# Patient Record
Sex: Female | Born: 1980
Health system: Southern US, Community
[De-identification: ages and names within clinical notes are randomized; demographics above are authoritative.]

## PROBLEM LIST (undated history)

## (undated) DIAGNOSIS — D219 Benign neoplasm of connective and other soft tissue, unspecified: Secondary | ICD-10-CM

## (undated) DIAGNOSIS — E785 Hyperlipidemia, unspecified: Secondary | ICD-10-CM

## (undated) DIAGNOSIS — G4730 Sleep apnea, unspecified: Secondary | ICD-10-CM

## (undated) DIAGNOSIS — I82409 Acute embolism and thrombosis of unspecified deep veins of unspecified lower extremity: Secondary | ICD-10-CM

## (undated) DIAGNOSIS — R87619 Unspecified abnormal cytological findings in specimens from cervix uteri: Secondary | ICD-10-CM

## (undated) DIAGNOSIS — J189 Pneumonia, unspecified organism: Secondary | ICD-10-CM

## (undated) DIAGNOSIS — D649 Anemia, unspecified: Secondary | ICD-10-CM

## (undated) DIAGNOSIS — F32A Depression, unspecified: Secondary | ICD-10-CM

## (undated) DIAGNOSIS — R5382 Chronic fatigue, unspecified: Secondary | ICD-10-CM

## (undated) DIAGNOSIS — F329 Major depressive disorder, single episode, unspecified: Secondary | ICD-10-CM

## (undated) DIAGNOSIS — R5383 Other fatigue: Secondary | ICD-10-CM

## (undated) DIAGNOSIS — R32 Unspecified urinary incontinence: Secondary | ICD-10-CM

## (undated) DIAGNOSIS — M549 Dorsalgia, unspecified: Secondary | ICD-10-CM

## (undated) DIAGNOSIS — Z72820 Sleep deprivation: Secondary | ICD-10-CM

## (undated) DIAGNOSIS — R0602 Shortness of breath: Secondary | ICD-10-CM

## (undated) DIAGNOSIS — R7303 Prediabetes: Secondary | ICD-10-CM

## (undated) DIAGNOSIS — N200 Calculus of kidney: Secondary | ICD-10-CM

## (undated) DIAGNOSIS — F909 Attention-deficit hyperactivity disorder, unspecified type: Secondary | ICD-10-CM

## (undated) DIAGNOSIS — K589 Irritable bowel syndrome without diarrhea: Secondary | ICD-10-CM

## (undated) DIAGNOSIS — G43909 Migraine, unspecified, not intractable, without status migrainosus: Secondary | ICD-10-CM

## (undated) DIAGNOSIS — E559 Vitamin D deficiency, unspecified: Secondary | ICD-10-CM

## (undated) DIAGNOSIS — I82A12 Acute embolism and thrombosis of left axillary vein: Secondary | ICD-10-CM

## (undated) DIAGNOSIS — F419 Anxiety disorder, unspecified: Secondary | ICD-10-CM

## (undated) DIAGNOSIS — K219 Gastro-esophageal reflux disease without esophagitis: Secondary | ICD-10-CM

## (undated) DIAGNOSIS — E079 Disorder of thyroid, unspecified: Secondary | ICD-10-CM

## (undated) HISTORY — DX: Acute embolism and thrombosis of left axillary vein: I82.A12

## (undated) HISTORY — DX: Depression, unspecified: F32.A

## (undated) HISTORY — DX: Unspecified abnormal cytological findings in specimens from cervix uteri: R87.619

## (undated) HISTORY — DX: Dorsalgia, unspecified: M54.9

## (undated) HISTORY — PX: CRYOTHERAPY: SHX1416

## (undated) HISTORY — DX: Sleep deprivation: Z72.820

## (undated) HISTORY — DX: Calculus of kidney: N20.0

## (undated) HISTORY — DX: Other fatigue: R53.83

## (undated) HISTORY — DX: Prediabetes: R73.03

## (undated) HISTORY — DX: Disorder of thyroid, unspecified: E07.9

## (undated) HISTORY — DX: Acute embolism and thrombosis of unspecified deep veins of unspecified lower extremity: I82.409

## (undated) HISTORY — DX: Major depressive disorder, single episode, unspecified: F32.9

## (undated) HISTORY — DX: Shortness of breath: R06.02

## (undated) HISTORY — DX: Benign neoplasm of connective and other soft tissue, unspecified: D21.9

## (undated) HISTORY — DX: Pneumonia, unspecified organism: J18.9

## (undated) HISTORY — DX: Attention-deficit hyperactivity disorder, unspecified type: F90.9

## (undated) HISTORY — DX: Unspecified urinary incontinence: R32

## (undated) HISTORY — DX: Gastro-esophageal reflux disease without esophagitis: K21.9

## (undated) HISTORY — DX: Anxiety disorder, unspecified: F41.9

## (undated) HISTORY — PX: INTRAUTERINE DEVICE INSERTION: SHX323

## (undated) HISTORY — DX: Sleep apnea, unspecified: G47.30

## (undated) HISTORY — DX: Irritable bowel syndrome, unspecified: K58.9

## (undated) HISTORY — DX: Hyperlipidemia, unspecified: E78.5

## (undated) HISTORY — DX: Chronic fatigue, unspecified: R53.82

## (undated) HISTORY — DX: Vitamin D deficiency, unspecified: E55.9

## (undated) HISTORY — DX: Migraine, unspecified, not intractable, without status migrainosus: G43.909

## (undated) HISTORY — DX: Anemia, unspecified: D64.9

---

## 2003-04-25 ENCOUNTER — Encounter: Payer: Self-pay | Admitting: *Deleted

## 2003-04-25 ENCOUNTER — Emergency Department (HOSPITAL_COMMUNITY): Admission: EM | Admit: 2003-04-25 | Discharge: 2003-04-25 | Payer: Self-pay

## 2005-07-23 ENCOUNTER — Emergency Department (HOSPITAL_COMMUNITY): Admission: EM | Admit: 2005-07-23 | Discharge: 2005-07-24 | Payer: Self-pay | Admitting: Family Medicine

## 2007-03-03 ENCOUNTER — Other Ambulatory Visit: Admission: RE | Admit: 2007-03-03 | Discharge: 2007-03-03 | Payer: Self-pay | Admitting: Obstetrics & Gynecology

## 2008-05-06 ENCOUNTER — Other Ambulatory Visit: Admission: RE | Admit: 2008-05-06 | Discharge: 2008-05-06 | Payer: Self-pay | Admitting: Obstetrics & Gynecology

## 2008-09-17 ENCOUNTER — Emergency Department (HOSPITAL_COMMUNITY): Admission: EM | Admit: 2008-09-17 | Discharge: 2008-09-18 | Payer: Self-pay | Admitting: Internal Medicine

## 2009-06-28 ENCOUNTER — Ambulatory Visit: Payer: Self-pay | Admitting: Family Medicine

## 2010-06-15 ENCOUNTER — Ambulatory Visit: Payer: Self-pay | Admitting: Family Medicine

## 2010-06-20 ENCOUNTER — Ambulatory Visit: Payer: Self-pay | Admitting: Family Medicine

## 2010-06-28 ENCOUNTER — Ambulatory Visit: Payer: Self-pay | Admitting: Gastroenterology

## 2010-07-07 ENCOUNTER — Inpatient Hospital Stay: Payer: Self-pay | Admitting: Surgery

## 2011-02-03 ENCOUNTER — Ambulatory Visit: Payer: Self-pay | Admitting: Family Medicine

## 2011-05-11 NOTE — Op Note (Signed)
NAME:  Savannah George, Savannah George                        ACCOUNT NO.:  [id number]   MEDICAL RECORD NO.:  [id number]                   PATIENT TYPE:  EMS   LOCATION:  ED                                   FACILITY:  [facility]   PHYSICIAN:  Dionne Ano. Everlene Other, M.D.         DATE OF BIRTH:  [birth year]   DATE OF PROCEDURE:  04/25/2003  DATE OF DISCHARGE:                                 OPERATIVE REPORT   HISTORY OF PRESENT ILLNESS:  I had the pleasure to see this patient in the  [facility] emergency room following the kind referral from Pearletha Alfred,  M.D., in regard to her left upper extremity predicament.  This patient is a  30 year old right-hand dominant female who had a heavy door slam against her  left index finger today, sustaining the above-mentioned injury.  The patient  has an open fracture with a partial nail avulsion, bleeding, and significant  pain.  She notes no opposite extremity pain or lower extremity pain.  She is  accompanied in the emergency room by her sister.  Tetanus status has been  addressed.   PAST MEDICAL HISTORY:  None.   PAST SURGICAL HISTORY:  None.   ALLERGIES:  ASPIRIN.   MEDICATIONS:  None.   SOCIAL HISTORY:  She does not smoke or drink.  She is a Consulting civil engineer at [employer].   PHYSICAL EXAMINATION:  GENERAL:  A white female, alert and oriented, in no  acute distress.  NECK, BACK, CHEST:  Benign.  ABDOMEN:  Nontender, nondistended.  EXTREMITIES:  The patient has normal right upper extremity exam.  The lower  extremity examination shows no obvious deformity or abnormality.  She is  neurovascularly intact.   The left upper extremity is significant for a nail bed laceration and nail  plate partial avulsion with open fracture about the left index finger.  The  PIP and MCP joints are intact.  The FDP, FPS, and extensor function is  normal.  Collateral ligaments are stable.  There is no sign of infection or  compartment syndrome.   LABORATORY DATA:  X-rays were  reviewed, which showed a distal phalanx  fracture, displaced in nature, about the left index finger.   IMPRESSION:  Open distal phalanx fracture, left index finger, with nail  plate partial avulsion, nail bed injury, and poor soft tissue conditions.   PLAN:  I verbally consented her for I&D and repair of structures.   PROCEDURE NOTE:  The patient was given a lidocaine block.  She was then  prepped and draped in the usual sterile fashion with Betadine scrub and  paint by myself.  Once this was done, a tourniquet was applied to the finger  and she underwent an I&D of skin, subcutaneous tissue, bone, and nail bed  tissue.  This was done without difficulty.  This was an excision  debridement, including particulate matter.  It  was copiously I&D'd.  The  nail plate then  underwent removal.  The nail plate was removed and I then  thoroughly irrigated the wound once again.  I ensured excellent cleanliness  of the opposing structures and then performed a reduction of the fracture.  This reduced nicely, and the bone was fit nicely.  Following this I  performed a nail bed repair with 6-0 chromic suture under 4.0 loupe  magnification.  I then repaired the lateral fold where there were  lacerations with 4-0 chromic.  She tolerated this well without difficulty.   Thus, she underwent I&D for repair of nail bed and removal of nail plate and  open reduction of her distal phalanx fracture.  She tolerated this well.  The tourniquet was deflated and excellent refill was noted.  Following this  a sterile dressing was applied and a __________ splint was placed.  She will  return to the office to see me in seven days in therapy and 14 days in my  clinic.  I have given her a prescription for Keflex as well as Vicodin and  Robaxin.  She will notify me should any problems, questions, or concerns  arise.  Otherwise I will look forward to seeing her as noted above.   All questions were encouraged and answered.   She was instructed on elevation  and other measures.                                                Dionne Ano. Everlene Other, M.D.    Nash Mantis  D:  04/25/2003  T:  04/26/2003  Job:  [id number]   cc:   Pearletha Alfred, M.D.  [address]  [place] [address]  Fax: [phone]

## 2011-07-02 IMAGING — US ABDOMEN ULTRASOUND
1 series · 17 of 25 positions shown · non-contrast
Comparison: none

REASON FOR EXAM: RUQ abd pain
COMMENTS:

[Series 1: abdomen ultrasound · 17 of 52 slices shown]
[im 1/52]
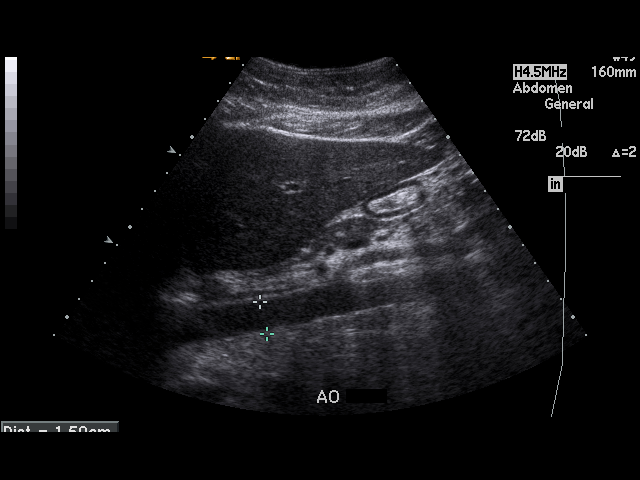
[im 5/52]
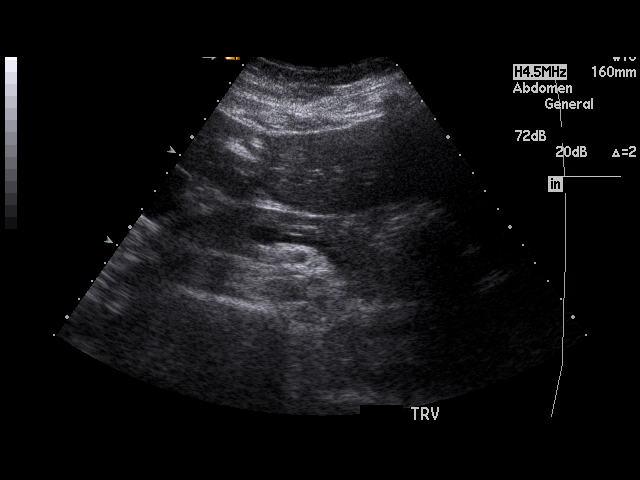
[im 7/52]
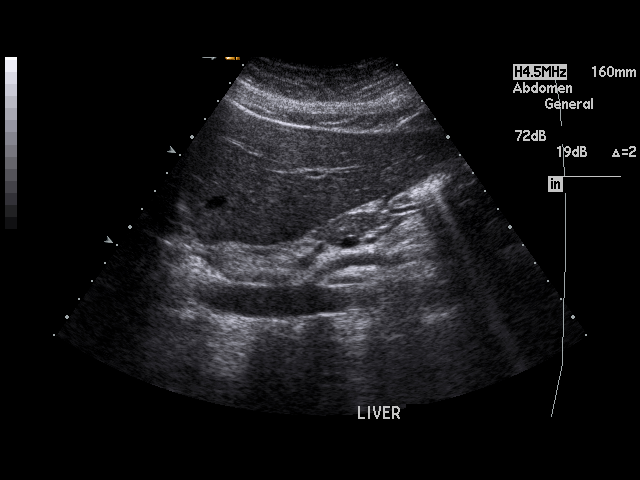
[im 11/52]
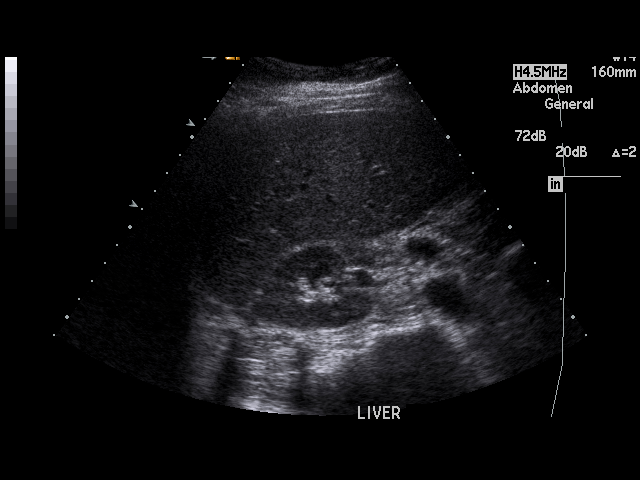
[im 13/52]
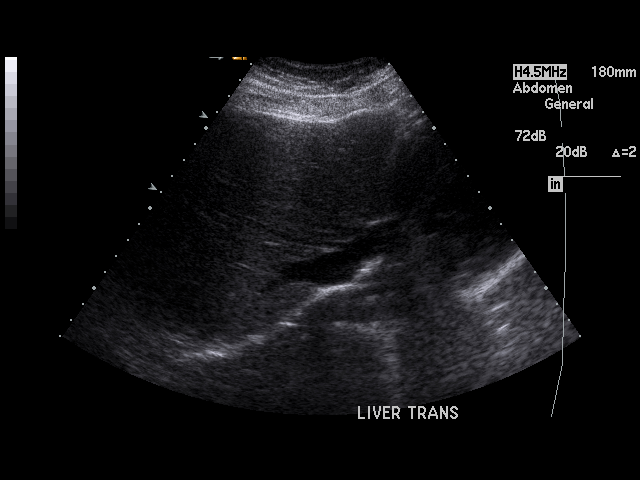
[im 18/52]
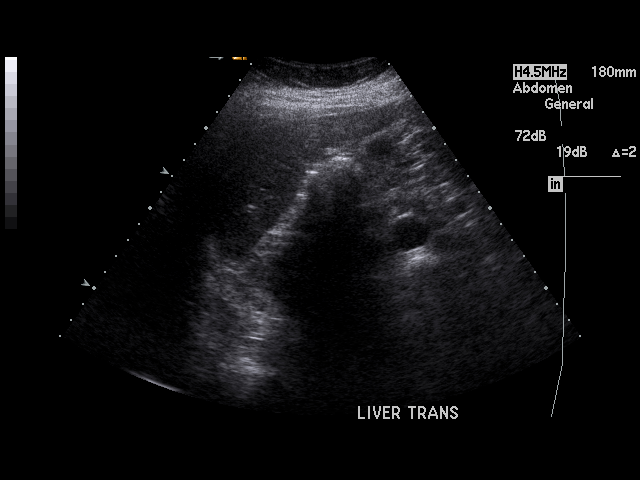
[im 20/52]
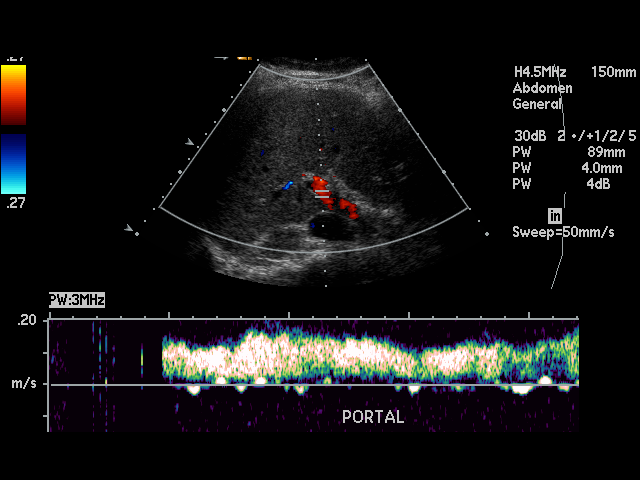
[im 24/52]
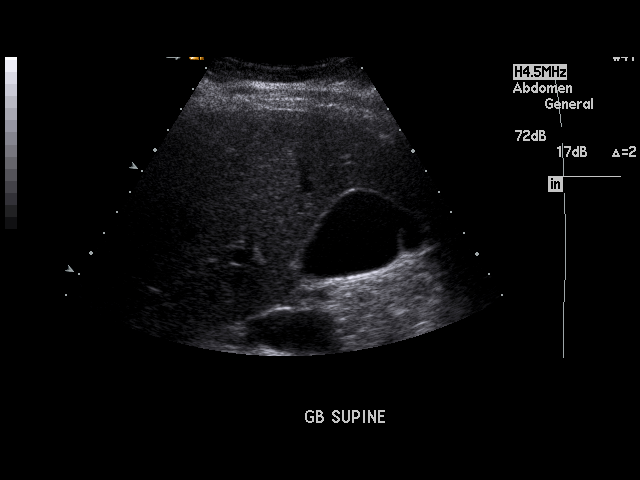
[im 26/52]
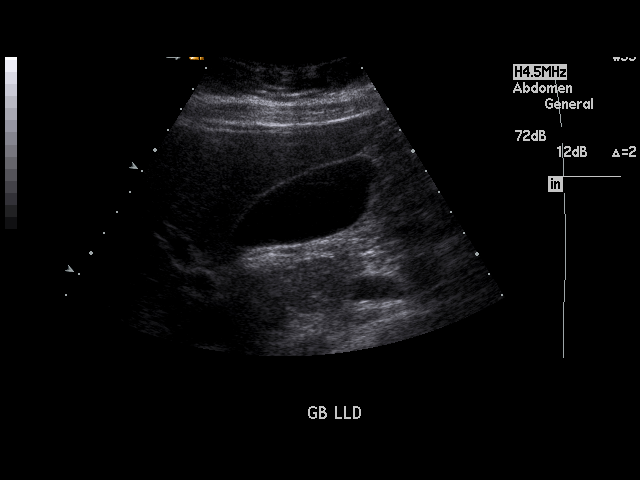
[im 28/52]
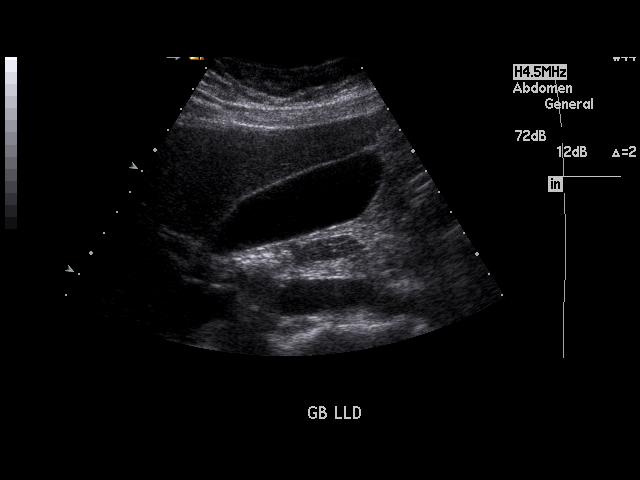
[im 32/52]
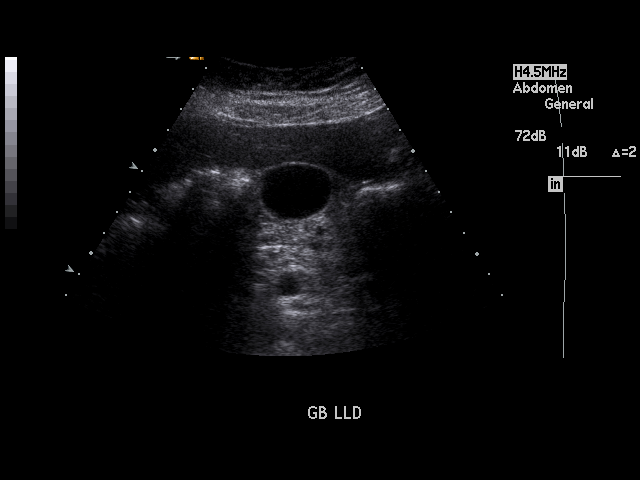
[im 35/52]
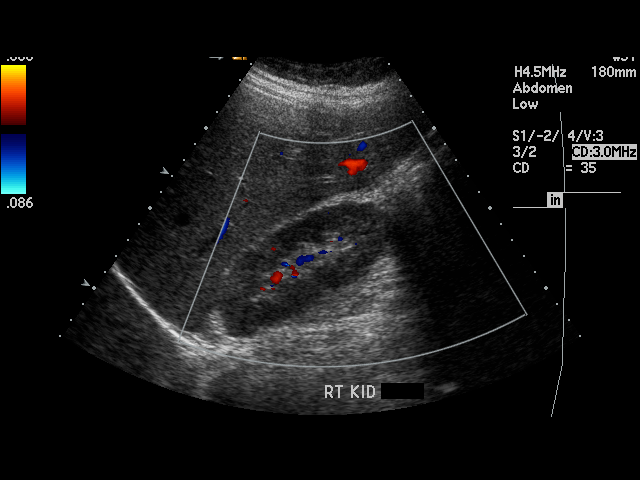
[im 39/52]
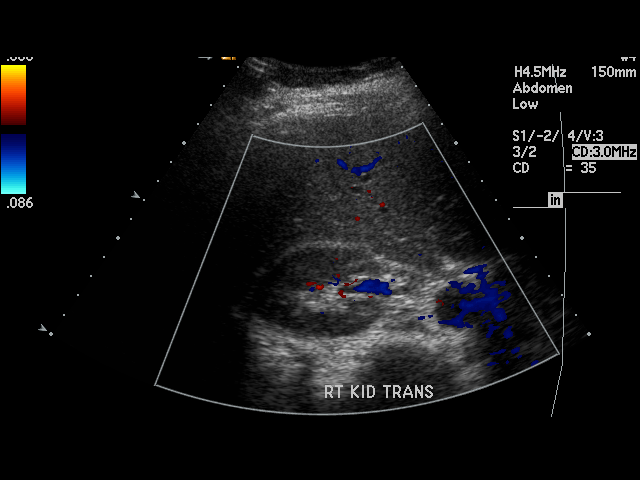
[im 41/52]
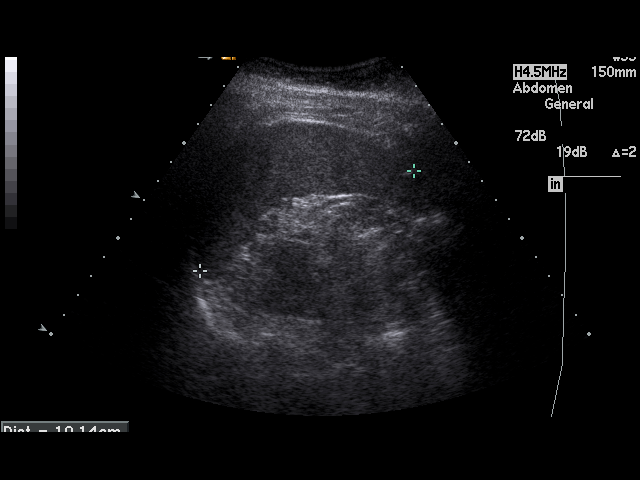
[im 45/52]
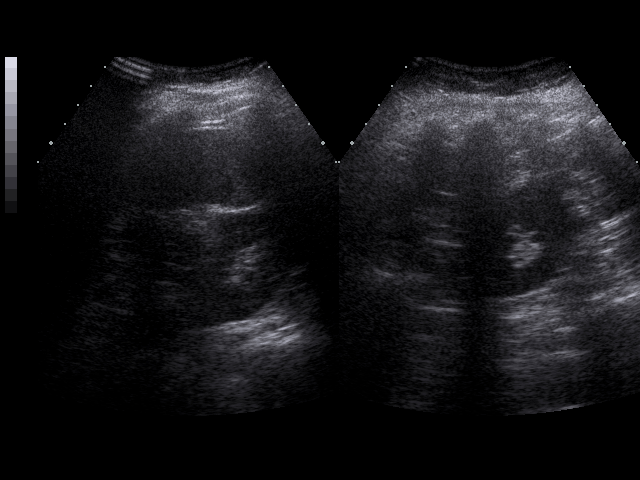
[im 47/52]
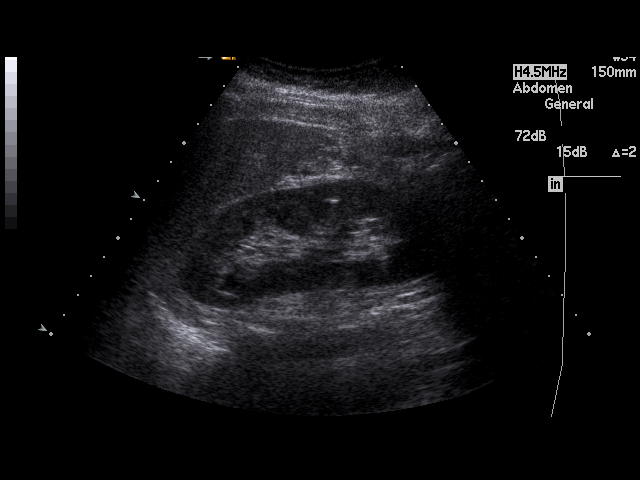
[im 52/52]
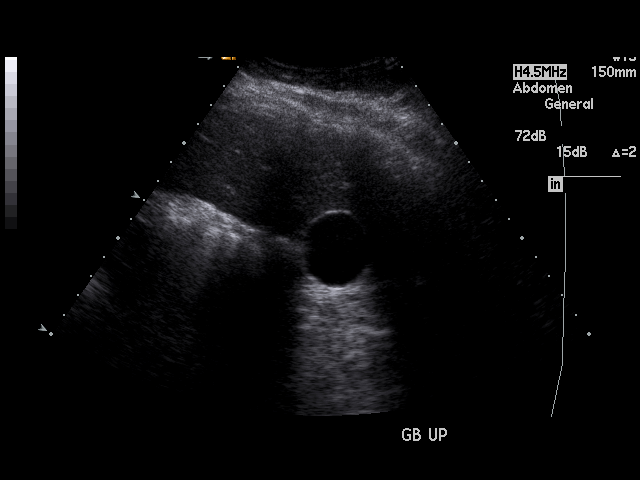

[17 of 25 positions shown; findings below may reference images not displayed]

PROCEDURE:     US  - US ABDOMEN GENERAL SURVEY  - June 15, 2010  [DATE]

RESULT:     The liver, spleen, abdominal aorta and inferior vena cava show
no significant abnormalities. The visualized portion of the pancreas is
normal in appearance. No gallstones are seen. There is no thickening of the
gallbladder wall. The common bile duct measures 3 mm in diameter which is
within normal limits. The kidneys show no hydronephrosis. There is no
ascites.
IMPRESSION: No significant abnormalities are noted.

## 2011-07-25 IMAGING — CR DG THORACIC SPINE 2-3V
1 series · 2 of 2 positions shown · non-contrast
Comparison: none

REASON FOR EXAM: Back pain
COMMENTS:

PROCEDURE:     DXR - DXR THORACIC  AP AND LATERAL  - July 08, 2010 [DATE]
RESULT:     Comparison: None

[Series 1: view not recorded · 0.17mm/px · 2 of 2 slices shown]
[im 1/2]
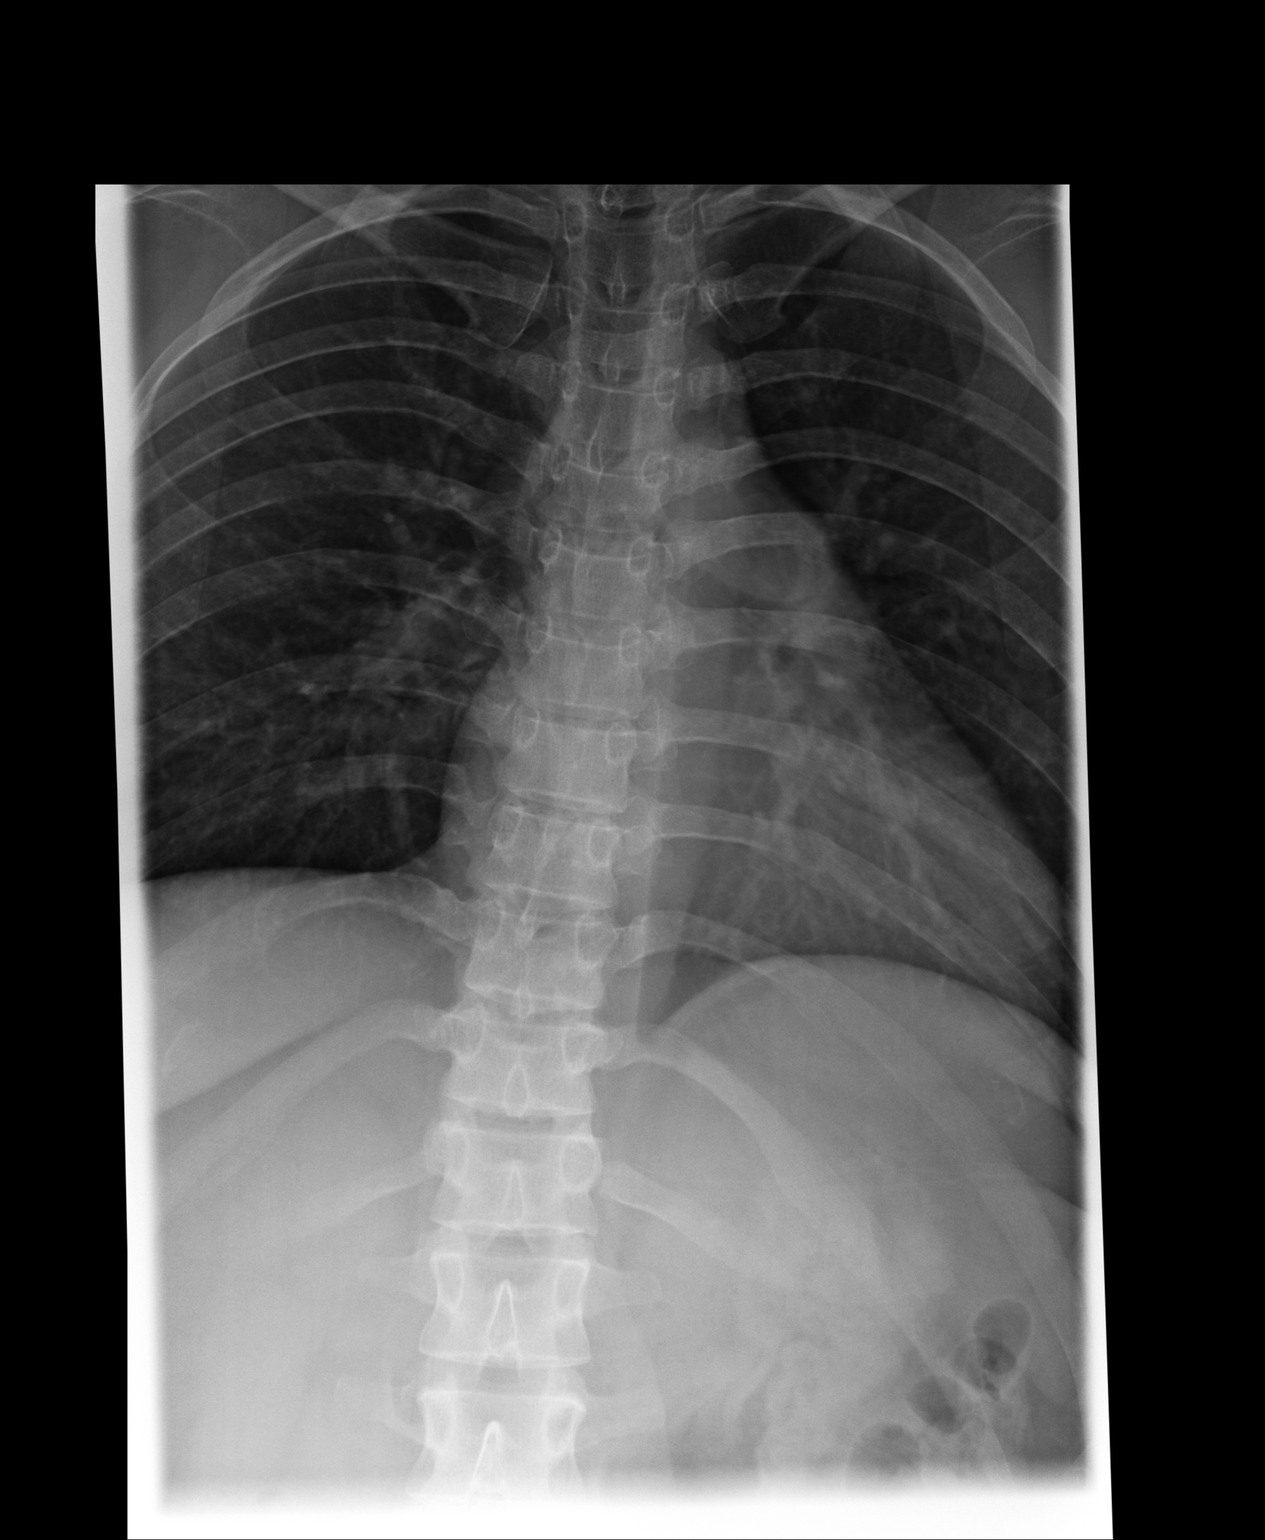
[im 2/2]
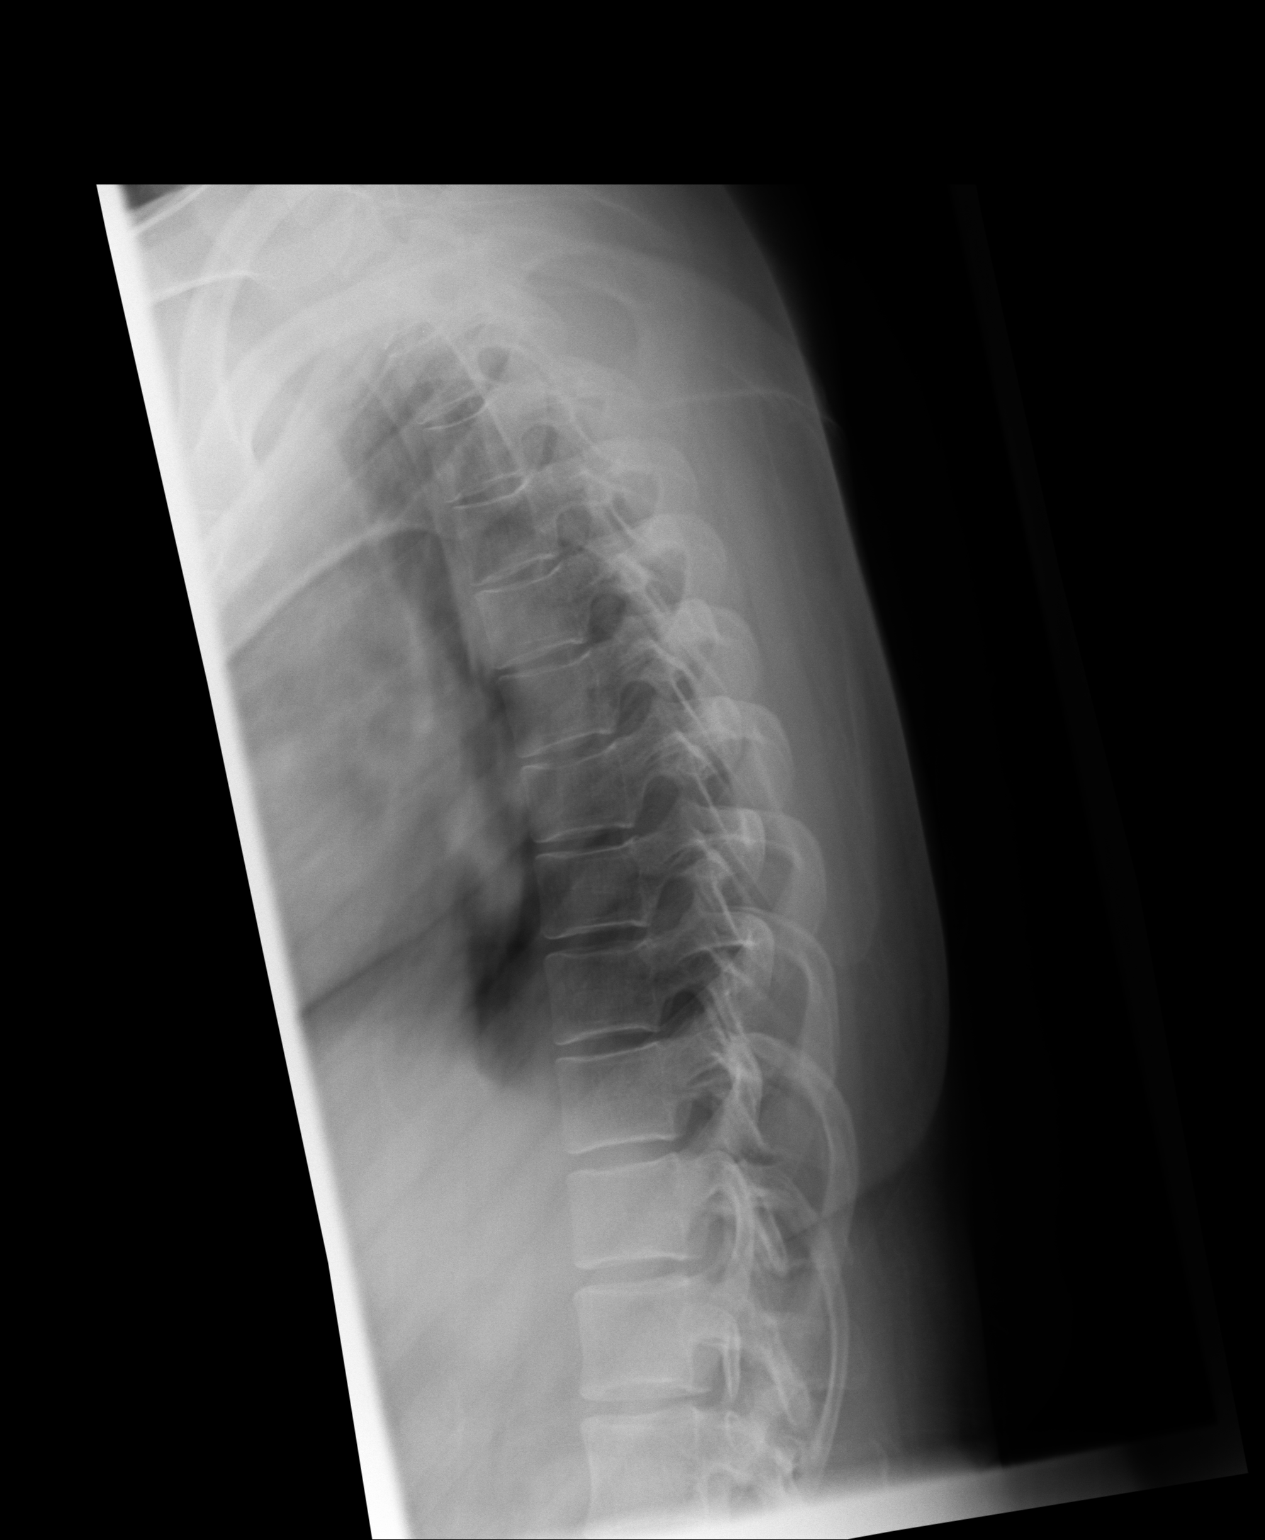

[2 of 2 positions shown; findings below may reference images not displayed]

FINDINGS: Evaluation of the upper thoracic spine is slightly on the lateral view
secondary to obscuration by the overlying structures. Vertebral body heights
are otherwise maintained. There is normal alignment. Intervertebral disc
heights are maintained.
IMPRESSION: No acute findings.

## 2011-07-25 IMAGING — CR DG LUMBAR SPINE 2-3V
1 series · 3 of 3 positions shown · non-contrast
Comparison: none

REASON FOR EXAM: Back pain
COMMENTS:

PROCEDURE:     DXR - DXR LUMBAR SPINE AP AND LATERAL  - July 08, 2010 [DATE]
RESULT:     Comparison: None.

[Series 1: view not recorded · 0.17mm/px · 3 of 3 slices shown]
[im 1/3]
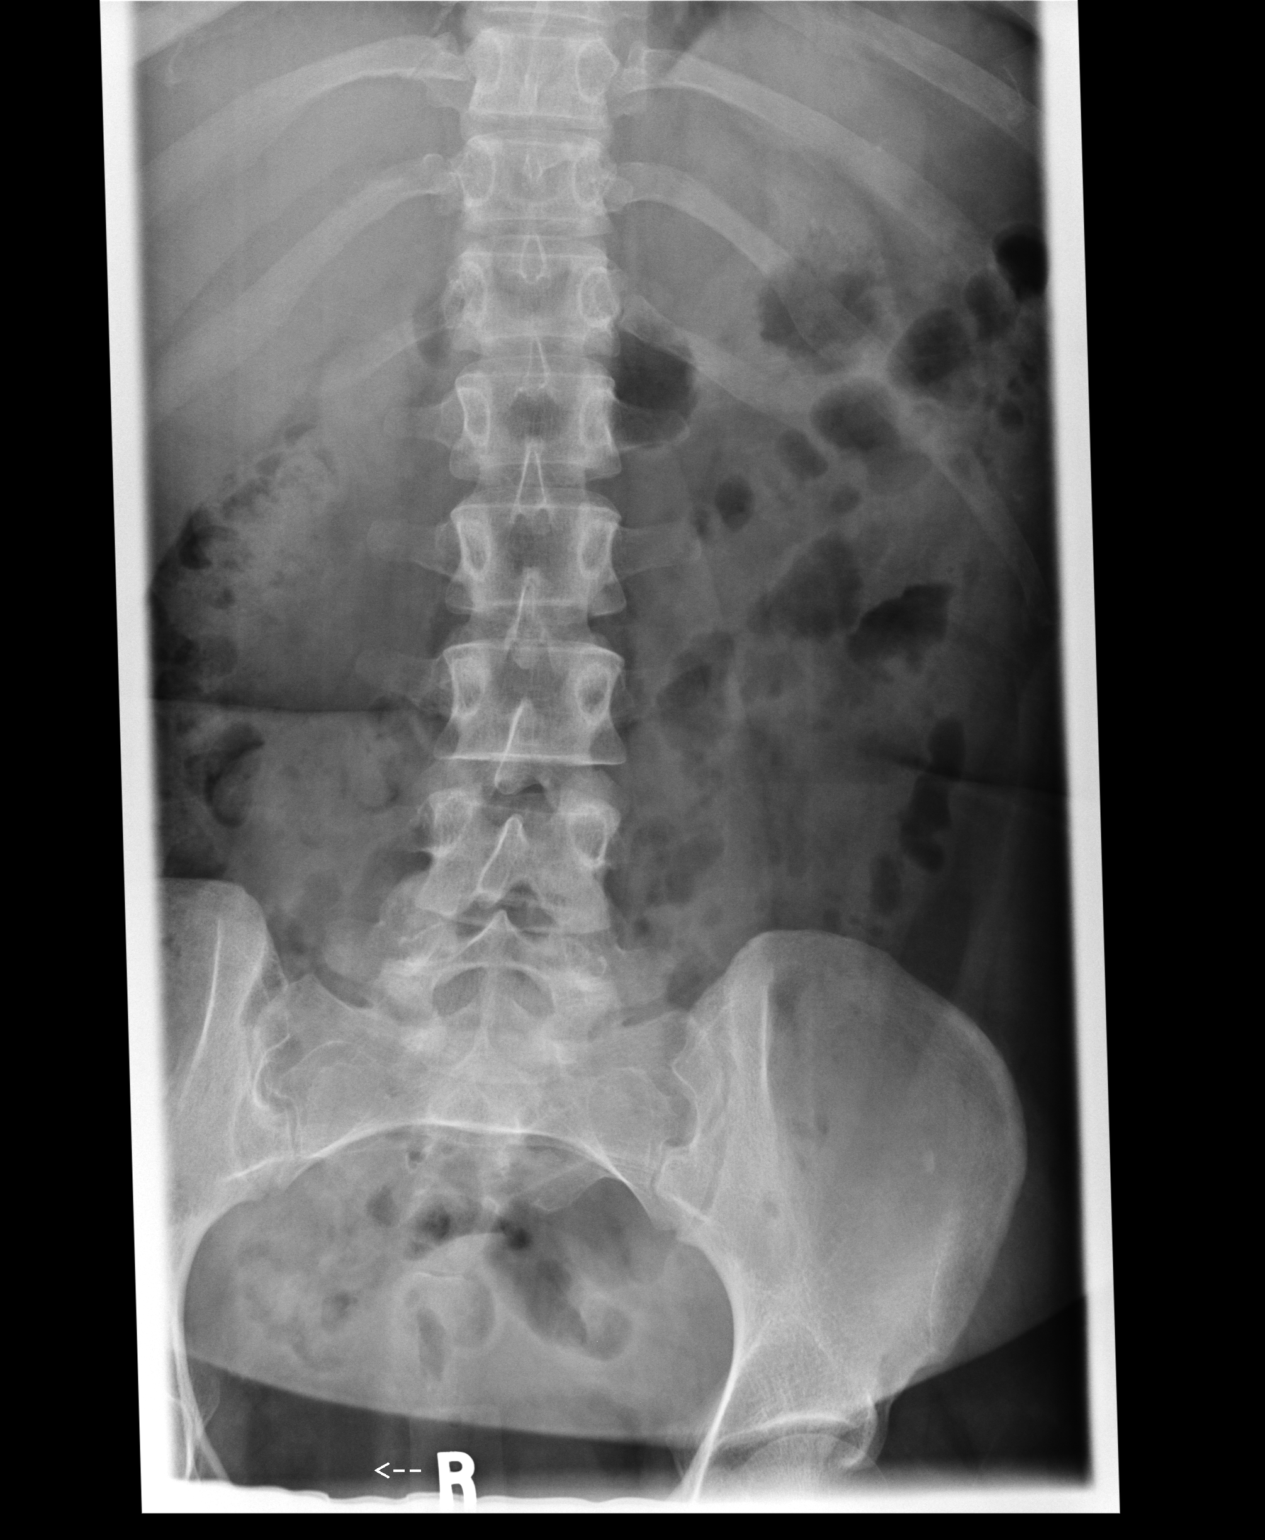
[im 2/3]
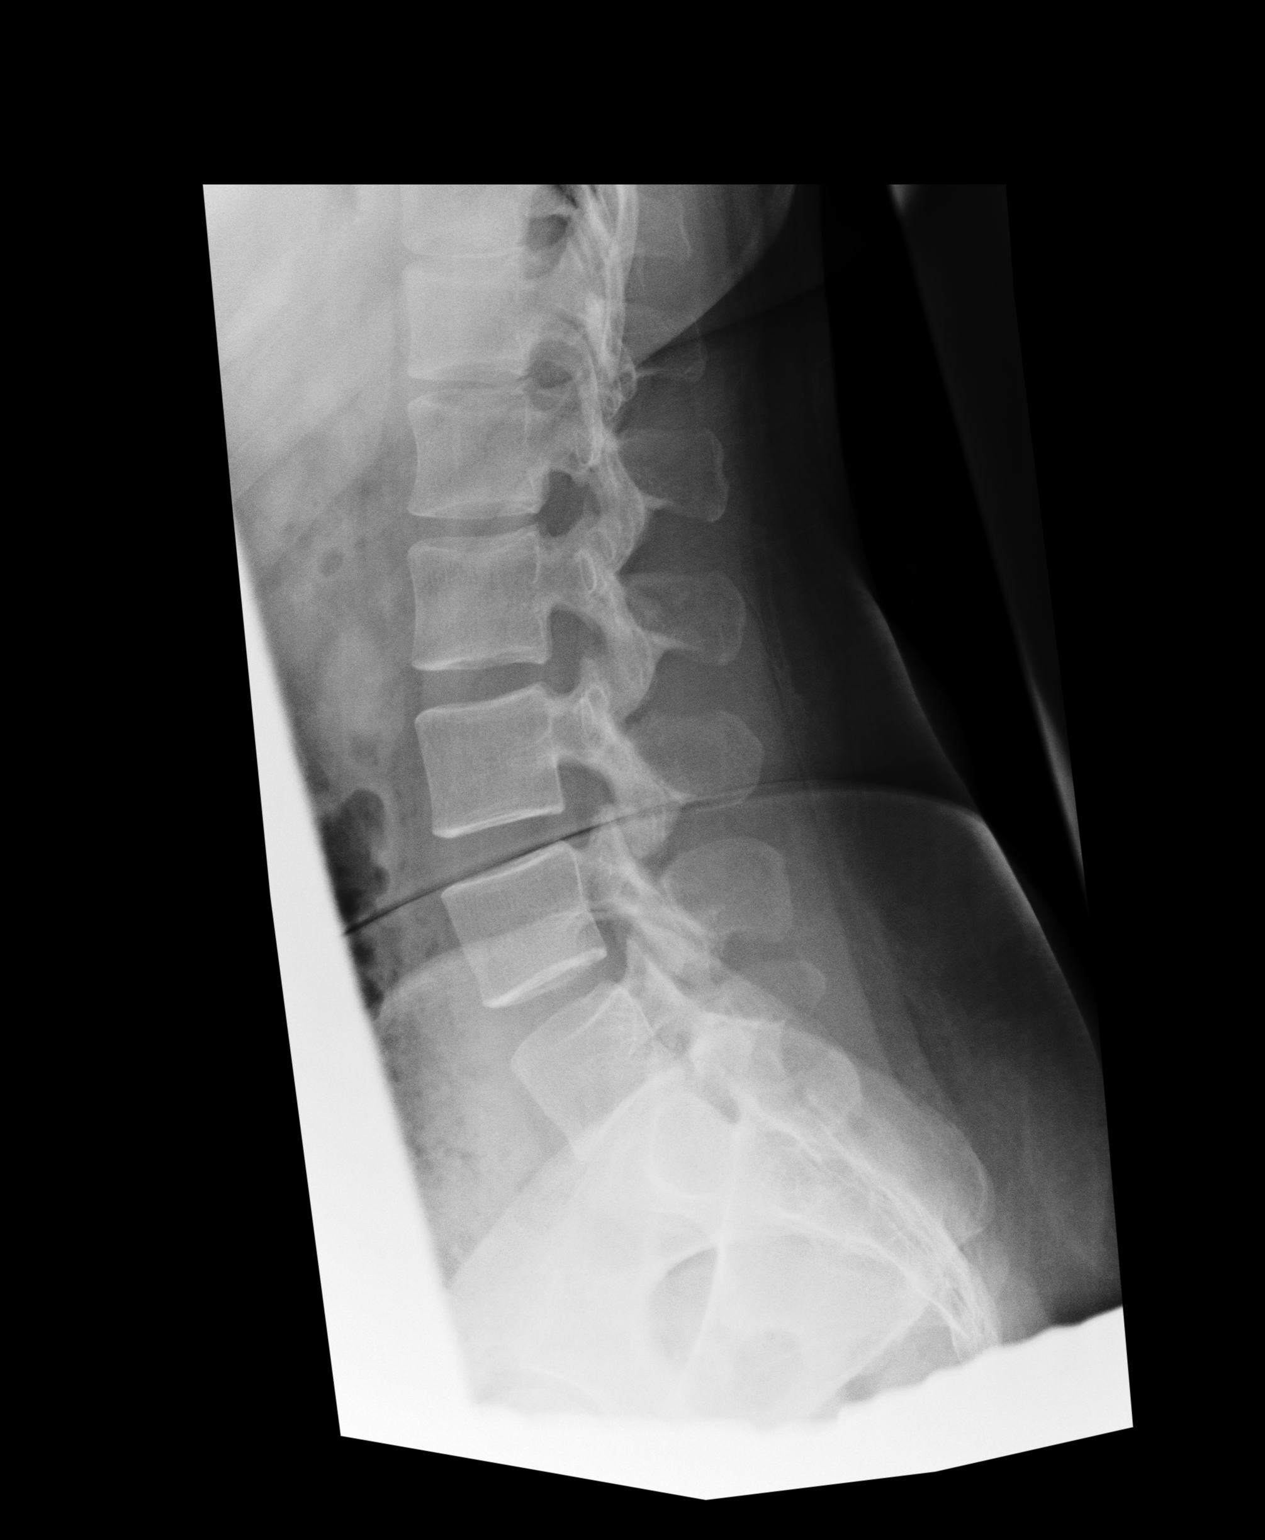
[im 3/3]
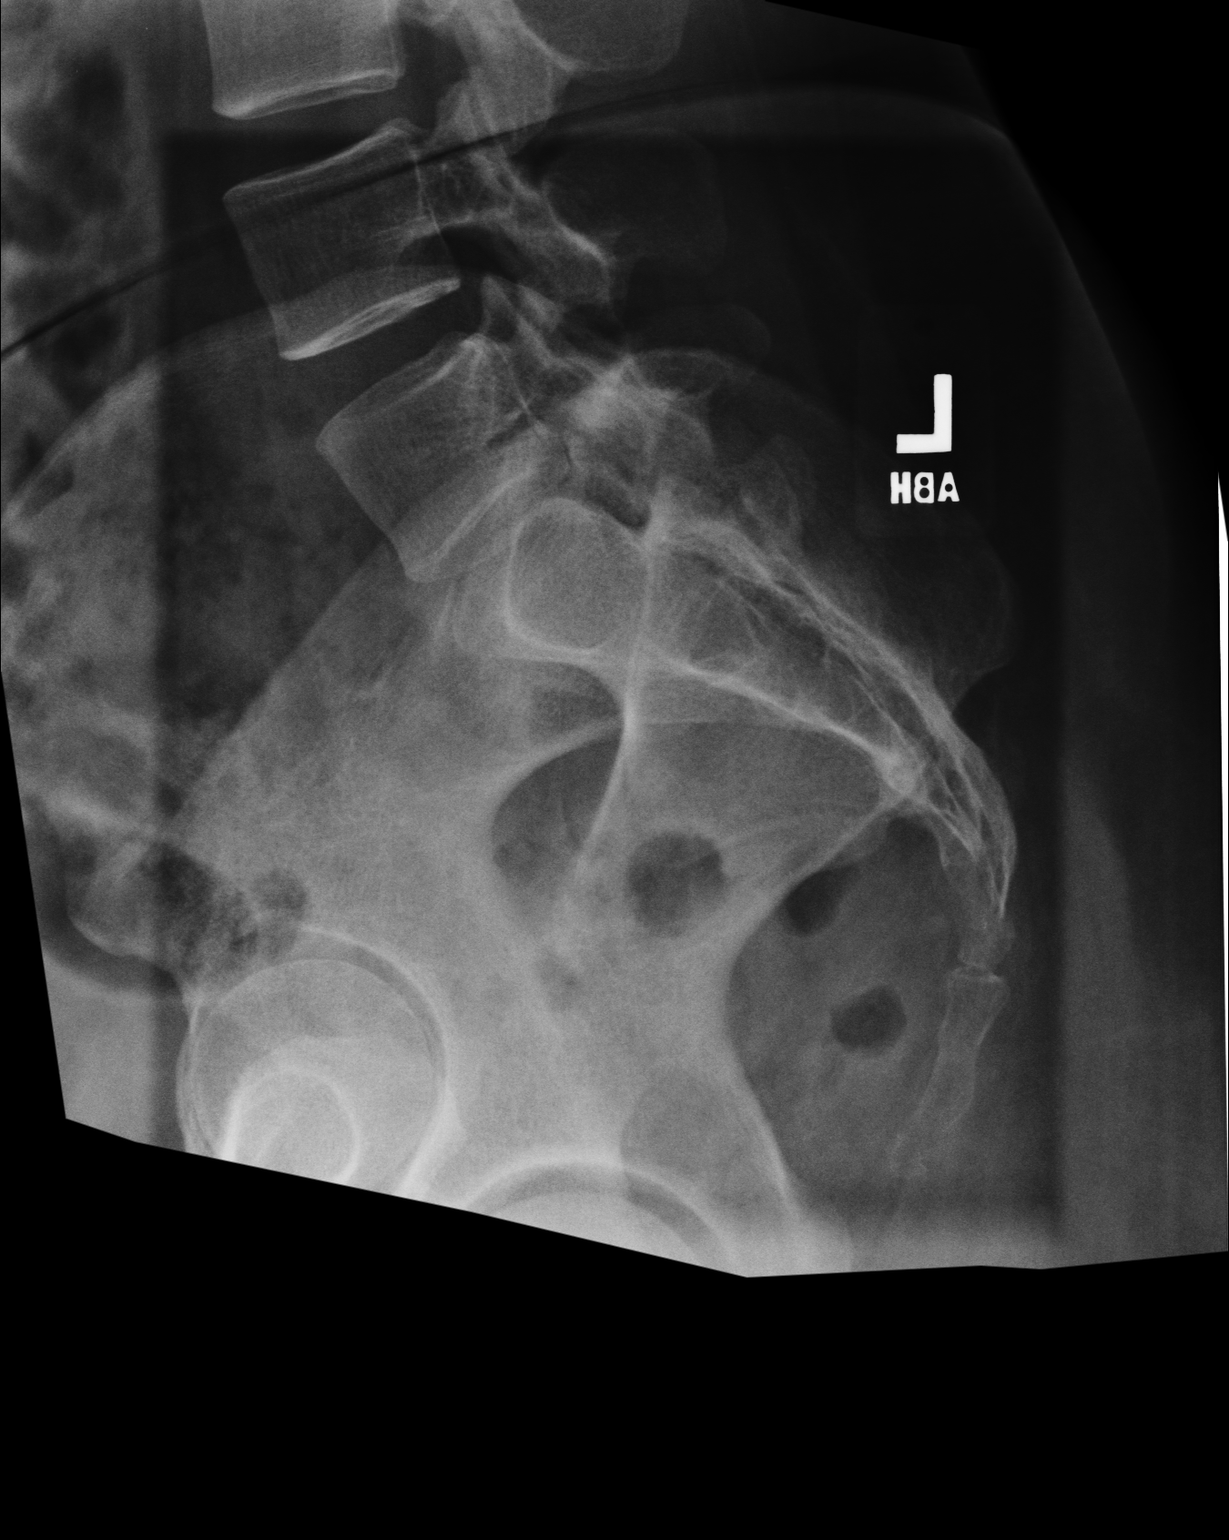

[3 of 3 positions shown; findings below may reference images not displayed]

FINDINGS: Five lumbar type vertebral bodies. Vertebral body heights and intervertebral
disc heights are maintained. Normal alignment. Apparent slight decrease in
intravertebral disc height at L5-S1 is felt to be related to obliquity of
imaging.
IMPRESSION: No significant radiographic evidence of degenerative disc disease.

## 2011-07-27 IMAGING — RF DG SMALL BOWEL
1 series · 6 of 6 positions shown · non-contrast
Comparison: none

REASON FOR EXAM: Abdominal pain
COMMENTS:

[Series 1: run · 6 of 6 slices shown]
[im 1/6]
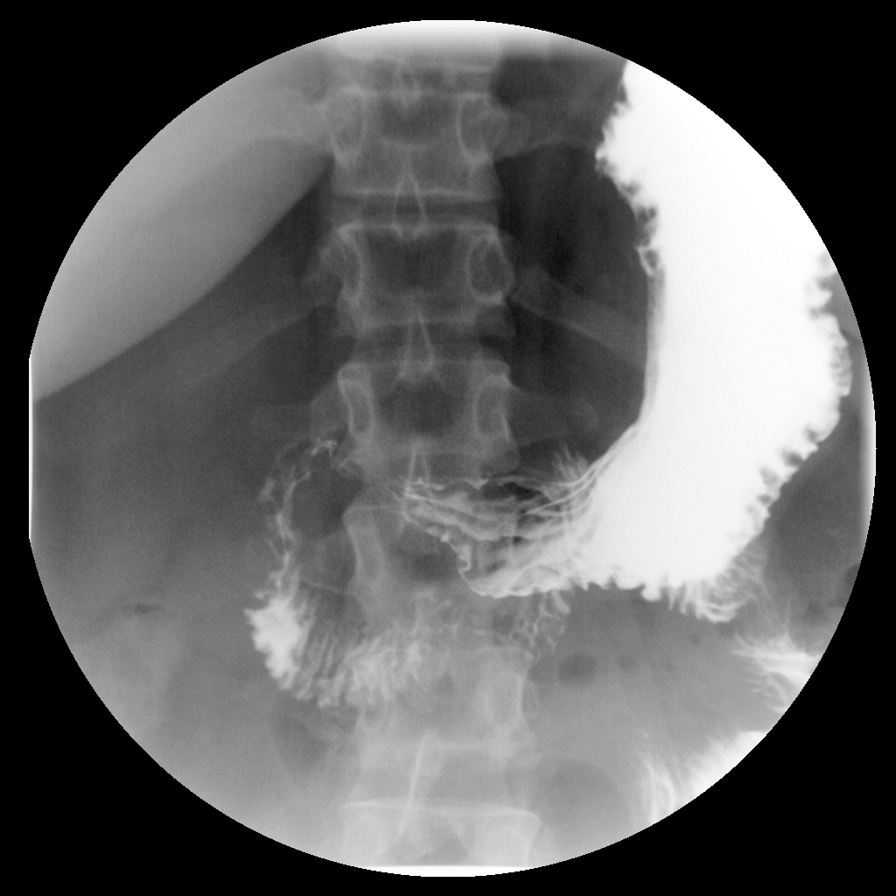
[im 2/6]
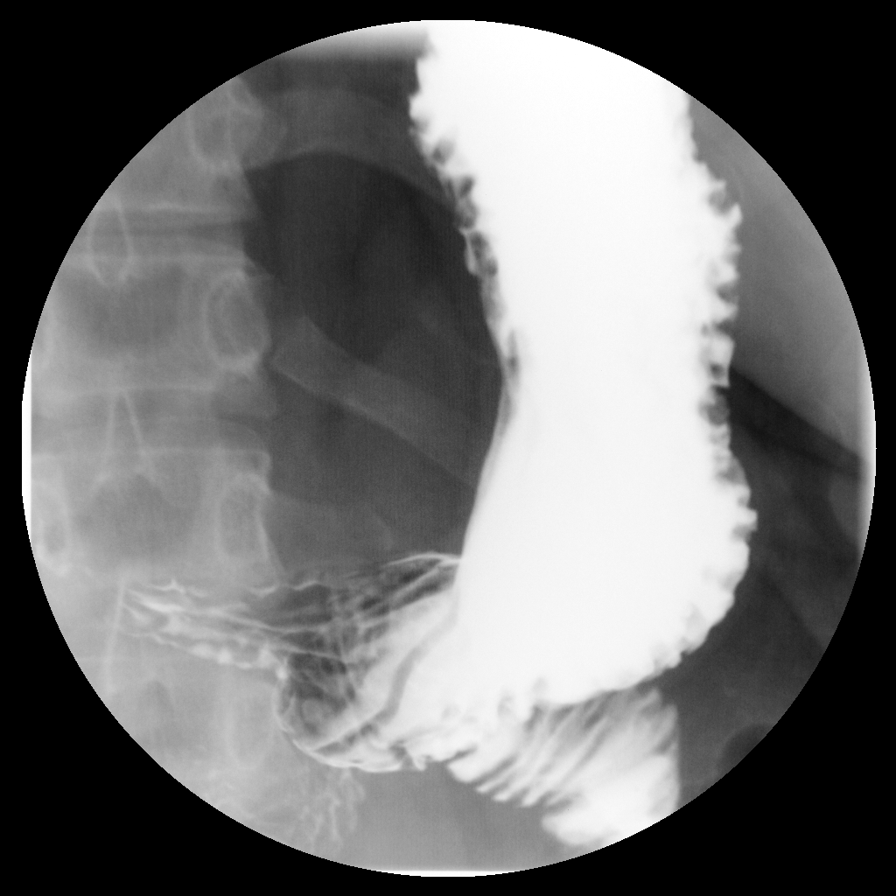
[im 3/6]
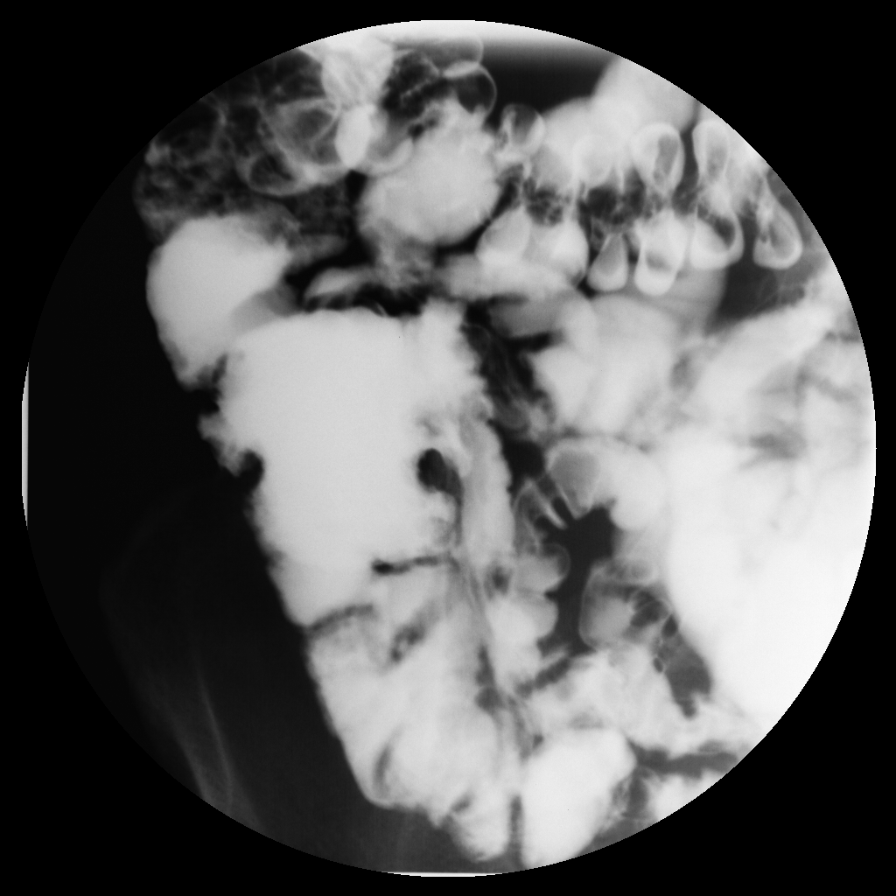
[im 4/6]
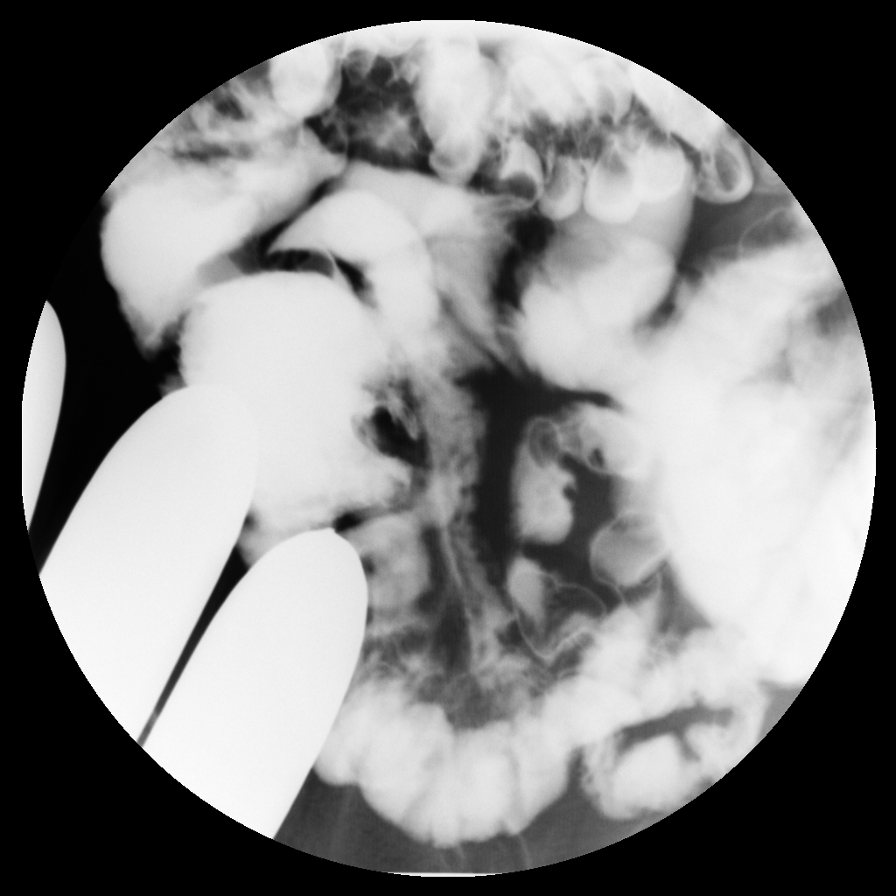
[im 5/6]
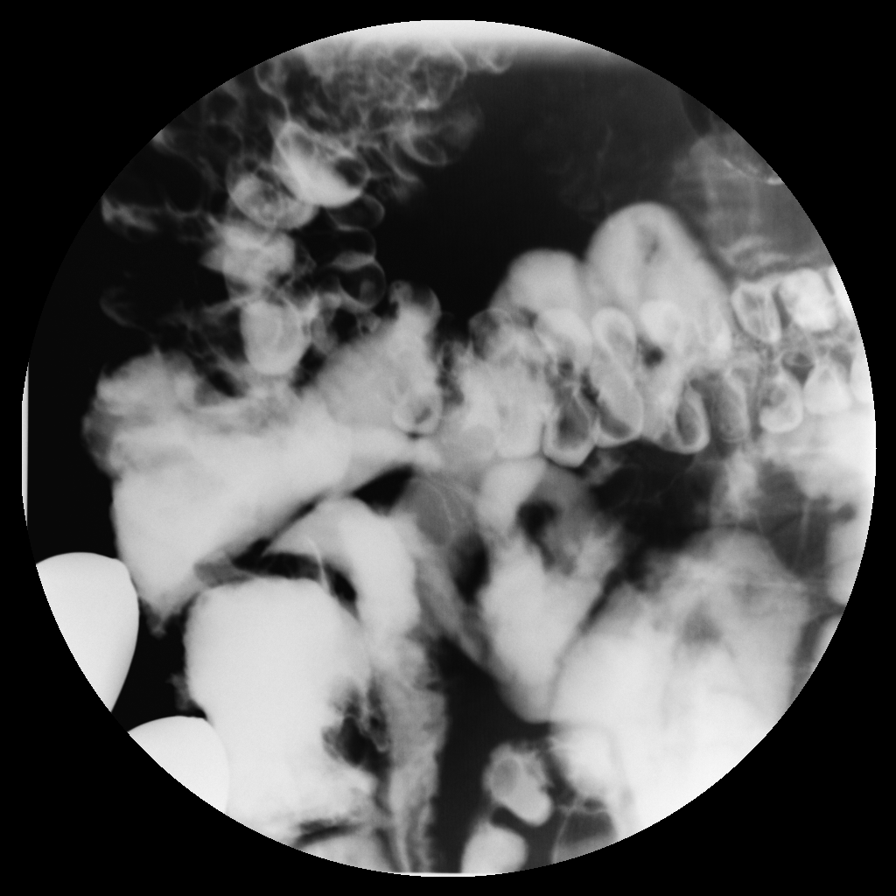
[im 6/6]
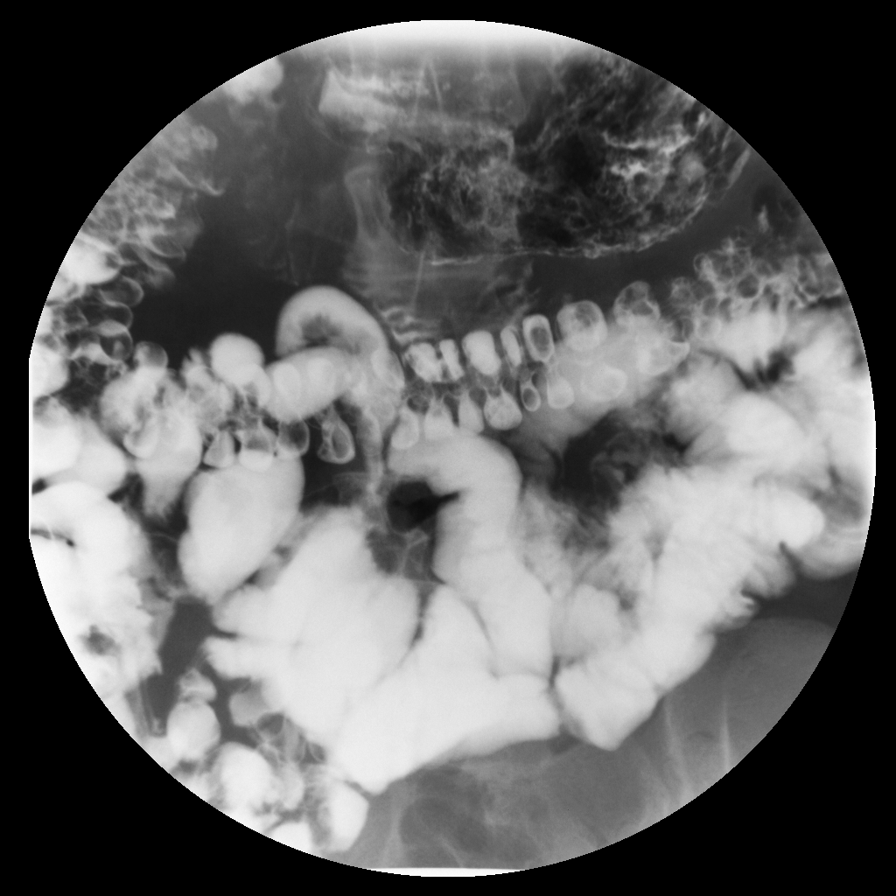

[6 of 6 positions shown; findings below may reference images not displayed]

PROCEDURE:     FL  - FL SMALL BOWEL  - July 10, 2010 [DATE]

RESULT:     Scout film demonstrates no abnormal bowel distention. There is
air and stool scattered through the colon to the rectum. The bowel is
unremarkable otherwise. The bony structures appear to be within normal
limits. The patient had some vomiting with ingestion of the initial cup of
barium. There was no evidence of gastric outlet obstruction. The barium
column reached the colon approximately one hour and 30 minutes after
initiation of the study. There is no evidence of persistent stenosis or
abnormal small bowel distention. The patient pointed to the area of maximal
tenderness which is in the subcostal region of the right upper quadrant over
the area of the gallbladder.
IMPRESSION: No focal gastric or small bowel abnormality evident. The
terminal ileum appears within normal limits.

## 2012-02-20 IMAGING — CR DG THORACIC SPINE 2-3V
1 series · 3 of 3 positions shown · non-contrast
Comparison: none

REASON FOR EXAM: rt thoracic and rt rib pain
COMMENTS:

PROCEDURE:     DXR - DXR THORACIC  AP AND LATERAL  - February 03, 2011  [DATE]
RESULT:     Comparison: 07/08/2010

[Series 1: view not recorded · 0.17mm/px · 3 of 3 slices shown]
[im 1/3]
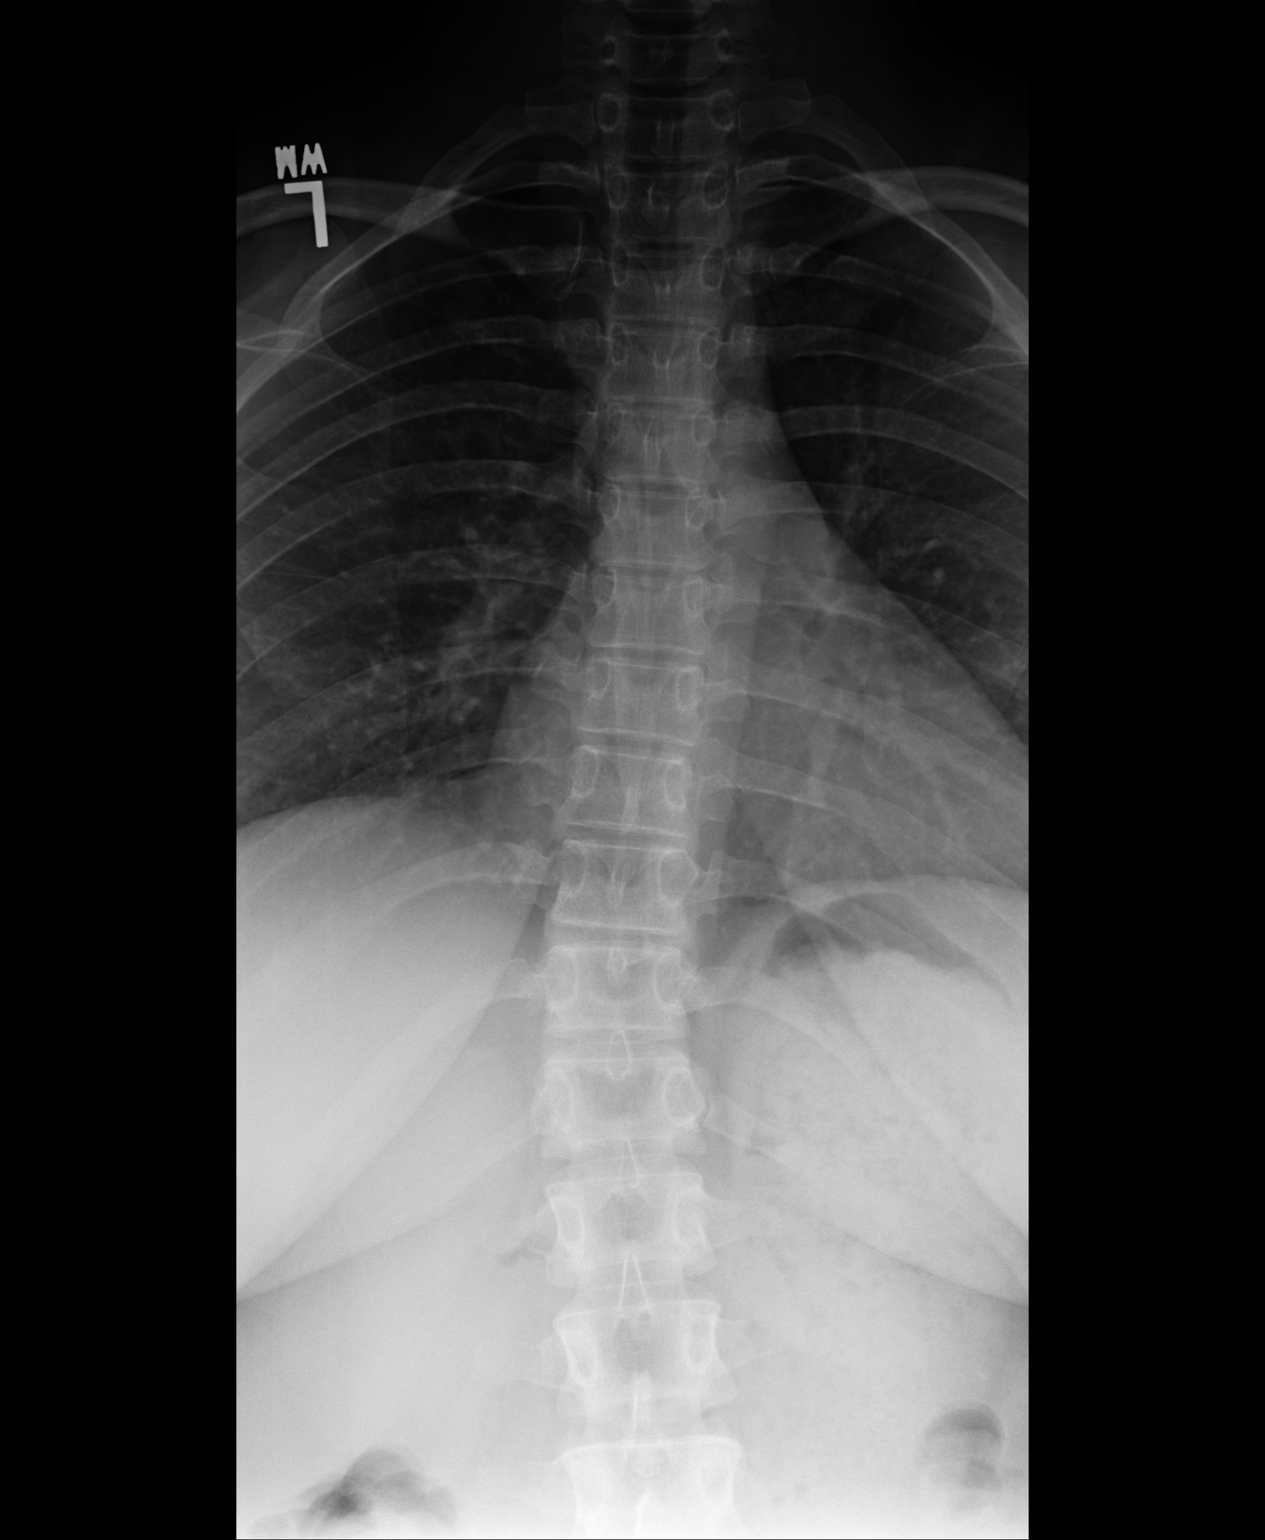
[im 2/3]
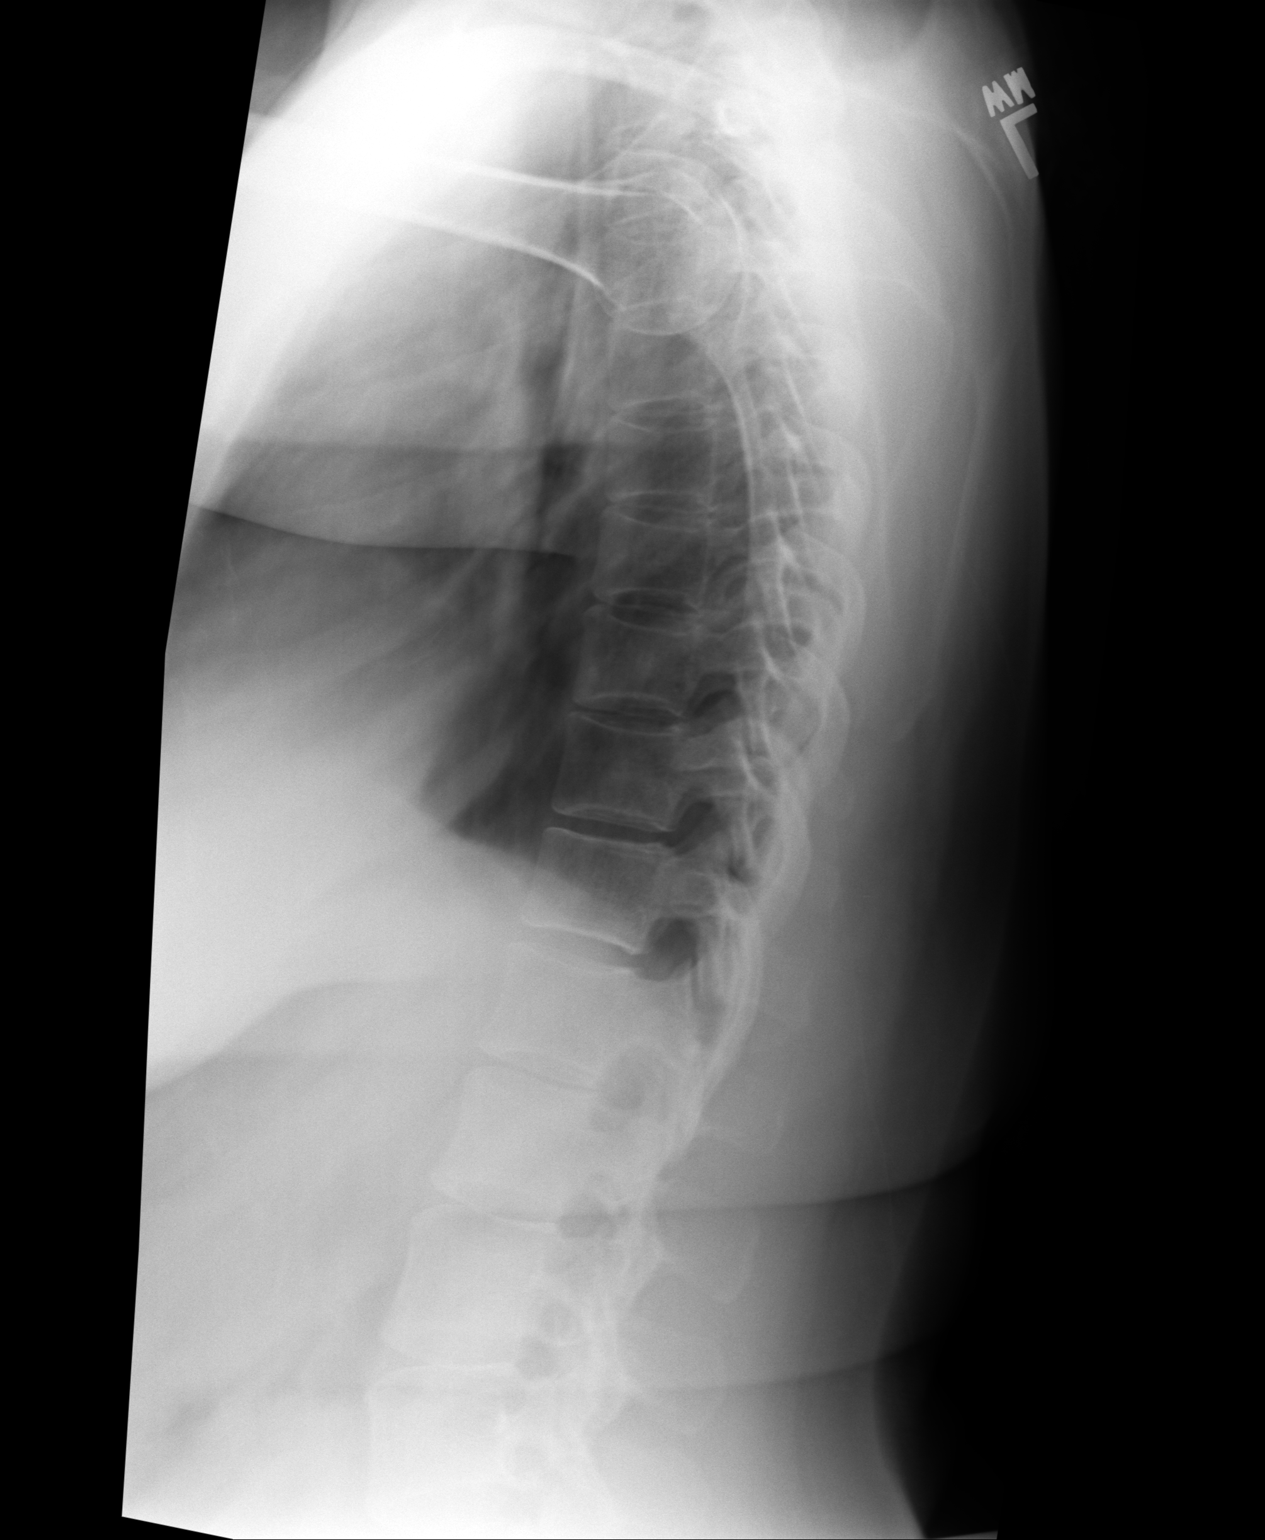
[im 3/3]
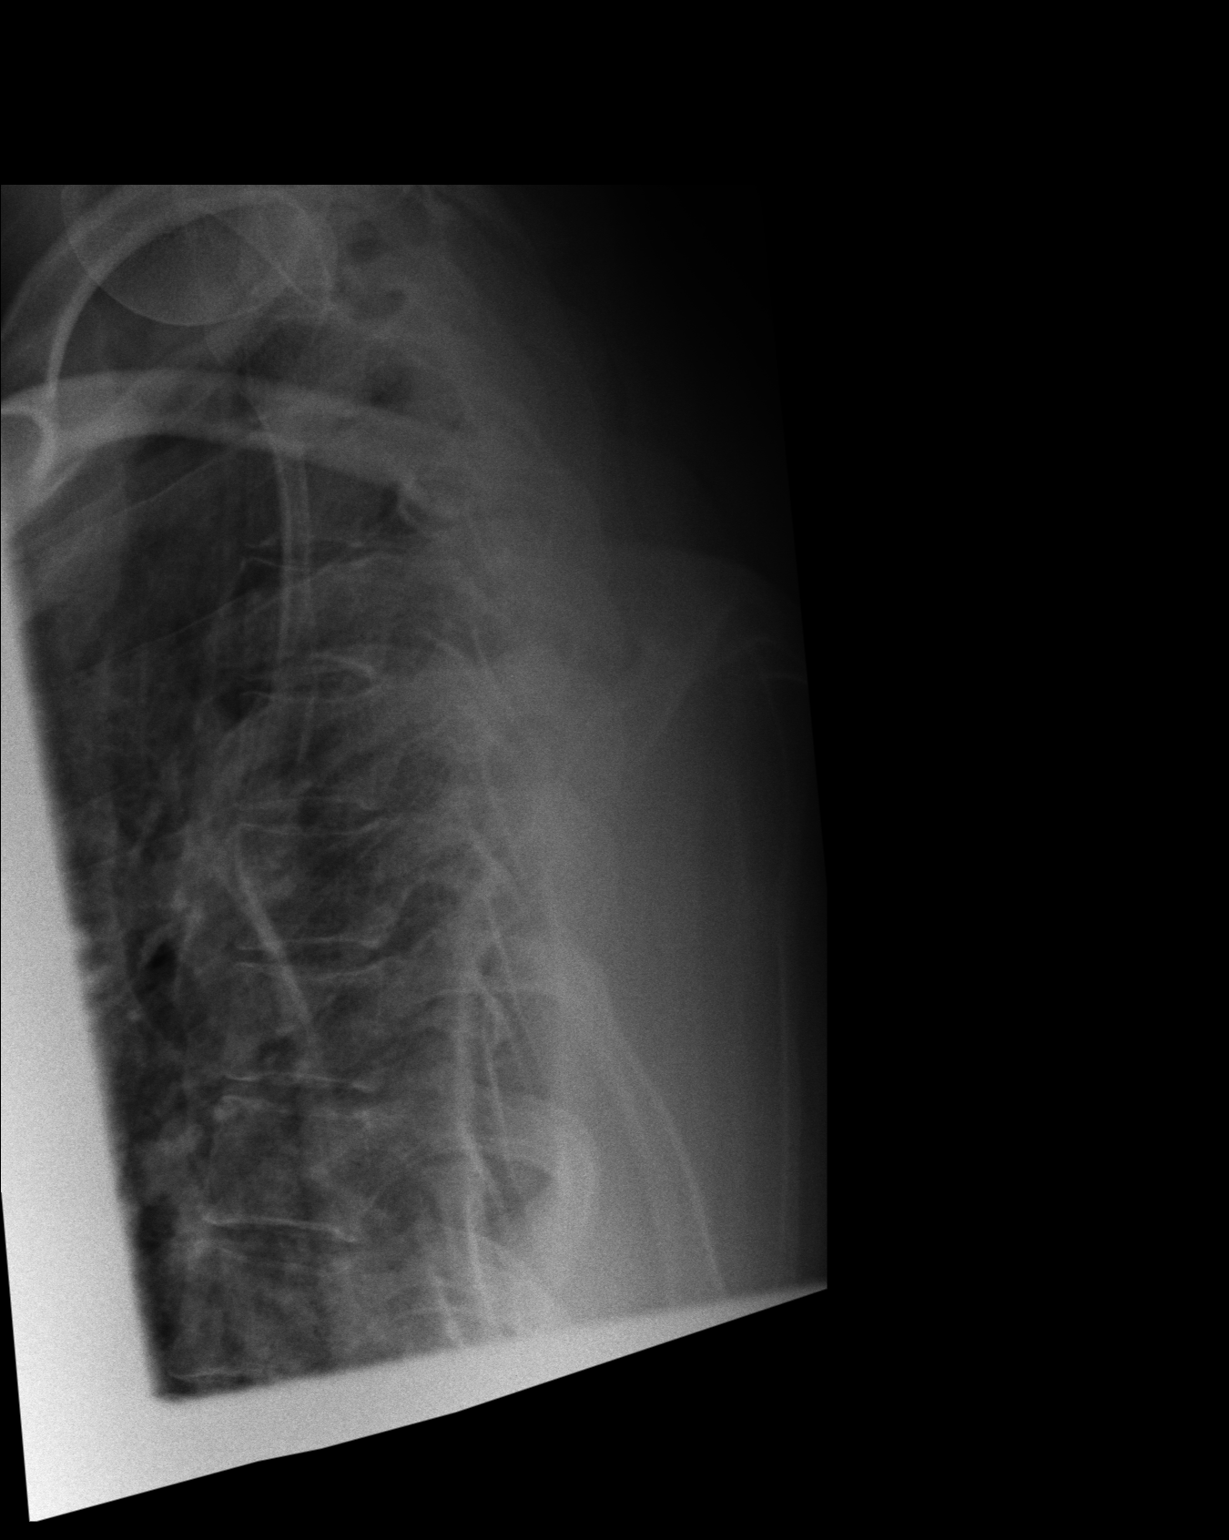

[3 of 3 positions shown; findings below may reference images not displayed]

FINDINGS: There is minimal dextrocurvature of the lower thoracic spine. Evaluation of
the upper thoracic spine is limited on the lateral view secondary to
overlying structures. Vertebral body heights are otherwise maintained.
Intervertebral disc heights are maintained. Normal alignment.
IMPRESSION: No acute findings.

## 2014-12-01 DIAGNOSIS — Z0000 Encounter for general adult medical examination without abnormal findings: Secondary | ICD-10-CM | POA: Insufficient documentation

## 2015-06-21 DIAGNOSIS — Z86718 Personal history of other venous thrombosis and embolism: Secondary | ICD-10-CM | POA: Insufficient documentation

## 2017-08-07 DIAGNOSIS — N390 Urinary tract infection, site not specified: Secondary | ICD-10-CM | POA: Diagnosis not present

## 2017-08-07 DIAGNOSIS — R51 Headache: Secondary | ICD-10-CM | POA: Diagnosis not present

## 2017-08-07 DIAGNOSIS — R5383 Other fatigue: Secondary | ICD-10-CM | POA: Diagnosis not present

## 2017-08-07 DIAGNOSIS — K219 Gastro-esophageal reflux disease without esophagitis: Secondary | ICD-10-CM | POA: Diagnosis not present

## 2017-08-19 ENCOUNTER — Encounter: Payer: Self-pay | Admitting: Obstetrics & Gynecology

## 2017-08-22 ENCOUNTER — Encounter: Payer: Self-pay | Admitting: Certified Nurse Midwife

## 2017-12-24 DIAGNOSIS — M797 Fibromyalgia: Secondary | ICD-10-CM

## 2017-12-24 HISTORY — DX: Fibromyalgia: M79.7

## 2018-02-25 DIAGNOSIS — E559 Vitamin D deficiency, unspecified: Secondary | ICD-10-CM | POA: Diagnosis not present

## 2018-02-25 DIAGNOSIS — K219 Gastro-esophageal reflux disease without esophagitis: Secondary | ICD-10-CM | POA: Diagnosis not present

## 2018-02-25 DIAGNOSIS — R6889 Other general symptoms and signs: Secondary | ICD-10-CM | POA: Diagnosis not present

## 2018-02-25 DIAGNOSIS — R5383 Other fatigue: Secondary | ICD-10-CM | POA: Diagnosis not present

## 2018-02-25 LAB — VITAMIN B12: VITAMIN B 12: 842

## 2018-02-25 LAB — VITAMIN D 25 HYDROXY (VIT D DEFICIENCY, FRACTURES): Vit D, 25-Hydroxy: 20.2

## 2018-05-08 DIAGNOSIS — L299 Pruritus, unspecified: Secondary | ICD-10-CM | POA: Diagnosis not present

## 2018-05-08 LAB — HEPATIC FUNCTION PANEL
ALT: 11 (ref 7–35)
AST: 10 — AB (ref 13–35)
Alkaline Phosphatase: 69 (ref 25–125)
BILIRUBIN, TOTAL: 0.4

## 2018-05-08 LAB — BASIC METABOLIC PANEL
BUN: 13 (ref 4–21)
CREATININE: 0.7 (ref 0.5–1.1)
GLUCOSE: 109
Potassium: 3.9 (ref 3.4–5.3)
Sodium: 137 (ref 137–147)

## 2018-05-08 LAB — CBC AND DIFFERENTIAL
HCT: 39 (ref 36–46)
HEMOGLOBIN: 13.1 (ref 12.0–16.0)
Neutrophils Absolute: 6
PLATELETS: 373 (ref 150–399)
WBC: 9.2

## 2018-05-08 LAB — TSH: TSH: 1.51 (ref 0.41–5.90)

## 2018-08-08 DIAGNOSIS — R350 Frequency of micturition: Secondary | ICD-10-CM | POA: Diagnosis not present

## 2018-09-07 DIAGNOSIS — Z23 Encounter for immunization: Secondary | ICD-10-CM | POA: Diagnosis not present

## 2018-09-07 DIAGNOSIS — J0140 Acute pansinusitis, unspecified: Secondary | ICD-10-CM | POA: Diagnosis not present

## 2018-09-17 ENCOUNTER — Encounter: Payer: Self-pay | Admitting: Certified Nurse Midwife

## 2018-10-02 ENCOUNTER — Encounter: Payer: Self-pay | Admitting: Certified Nurse Midwife

## 2018-10-10 ENCOUNTER — Other Ambulatory Visit: Payer: Self-pay

## 2018-10-10 ENCOUNTER — Encounter: Payer: Self-pay | Admitting: Certified Nurse Midwife

## 2018-10-10 ENCOUNTER — Ambulatory Visit: Payer: 59 | Admitting: Certified Nurse Midwife

## 2018-10-10 ENCOUNTER — Other Ambulatory Visit (HOSPITAL_COMMUNITY)
Admission: RE | Admit: 2018-10-10 | Discharge: 2018-10-10 | Disposition: A | Discharge status: Home / Self Care | Payer: 59 | Source: Ambulatory Visit | Attending: Certified Nurse Midwife | Admitting: Certified Nurse Midwife

## 2018-10-10 VITALS — BP 122/70 | HR 68 | Resp 16 | Ht 61.75 in | Wt 197.0 lb

## 2018-10-10 DIAGNOSIS — Z113 Encounter for screening for infections with a predominantly sexual mode of transmission: Secondary | ICD-10-CM | POA: Diagnosis not present

## 2018-10-10 DIAGNOSIS — Z8049 Family history of malignant neoplasm of other genital organs: Secondary | ICD-10-CM

## 2018-10-10 DIAGNOSIS — Z30431 Encounter for routine checking of intrauterine contraceptive device: Secondary | ICD-10-CM

## 2018-10-10 DIAGNOSIS — Z124 Encounter for screening for malignant neoplasm of cervix: Secondary | ICD-10-CM | POA: Insufficient documentation

## 2018-10-10 DIAGNOSIS — Z01419 Encounter for gynecological examination (general) (routine) without abnormal findings: Secondary | ICD-10-CM | POA: Diagnosis not present

## 2018-10-10 DIAGNOSIS — Z8041 Family history of malignant neoplasm of ovary: Secondary | ICD-10-CM

## 2018-10-10 DIAGNOSIS — R32 Unspecified urinary incontinence: Secondary | ICD-10-CM | POA: Diagnosis not present

## 2018-10-10 DIAGNOSIS — N912 Amenorrhea, unspecified: Secondary | ICD-10-CM | POA: Diagnosis not present

## 2018-10-10 LAB — POCT URINE PREGNANCY: PREG TEST UR: NEGATIVE

## 2018-10-10 NOTE — Patient Instructions (Signed)

## 2018-10-10 NOTE — Progress Notes (Signed)
37 y.o. G0P0000 Single  Caucasian Fe here to re- establish gyn care for annual exam. Periods normal with occasional missed period 1-2 months, but has not had cycle since 04/23/18. New partner desires STD screening. Patient has gained weight in the past year of about 15-20 pounds.Working on weight loss. Seeing [facility] and establishing PCP closer to her. Patient continues to work as Licensed conveyancer and has moved back to area from [place]. Had care there and was on OCP and developed DVT 11/2013.  and changed to Paragard IUD for contraception. Patient also has family history with sisters with ovarian and uterine cancer in their 91's. No other health concerns today. Declines screening labs will do with PCP, but labs regarding amenorrhea agreeable.  Patient's last menstrual period was 04/23/2018 (exact date).          Sexually active: Yes.    The current method of family planning is Paragard IUD inserted  12/2013 approximately had  DVT in 12/14 approx. Exercising: Yes.    occ Smoker:  no  Review of Systems  Constitutional: Negative.   HENT: Negative.   Eyes: Negative.   Respiratory: Negative.   Cardiovascular: Negative.   Gastrointestinal: Negative.   Genitourinary:       Amenorrhea  Musculoskeletal: Negative.   Skin: Negative.   Neurological: Negative.   Endo/Heme/Allergies: Negative.   Psychiatric/Behavioral: Negative.     Health Maintenance: Pap:  3-70yrs ago History of Abnormal Pap: yes MMG:  3-4 yrs ago Self Breast exams: yes Colonoscopy:  none BMD:   none TDaP:  unsure Shingles: no Pneumonia: no Hep C and HIV: not done Labs: upt-neg   reports that she has never smoked. She has never used smokeless tobacco. She reports that she drinks alcohol. She reports that she has current or past drug history.  Past Medical History:  Diagnosis Date  . Abnormal Pap smear of cervix    age 32 or 27  . Anemia   . Anxiety   . Depression   . Migraines   . Urinary incontinence     Past Surgical  History:  Procedure Laterality Date  . CRYOTHERAPY     for abnormal pap smear- age 50 or 28    Current Outpatient Medications  Medication Sig Dispense Refill  . Cholecalciferol (VITAMIN D PO) Take 2,000 Int'l Units by mouth.    . loratadine (CLARITIN) 10 MG tablet Take 10 mg by mouth daily.    . Multiple Vitamins-Minerals (MULTIVITAMIN PO) Take by mouth.     No current facility-administered medications for this visit.     History reviewed. No pertinent family history.  ROS:  Pertinent items are noted in HPI.  Otherwise, a comprehensive ROS was negative.  Exam:   BP 122/70   Pulse 68   Resp 16   Ht 5' 1.75" (1.568 m)   Wt 197 lb (89.4 kg)   LMP 04/23/2018 (Exact Date)   BMI 36.32 kg/m  Height: 5' 1.75" (156.8 cm) Ht Readings from Last 3 Encounters:  10/10/18 5' 1.75" (1.568 m)    General appearance: alert, cooperative and appears stated age Head: Normocephalic, without obvious abnormality, atraumatic Neck: no adenopathy, supple, symmetrical, trachea midline and thyroid normal to inspection and palpation Lungs: clear to auscultation bilaterally Breasts: normal appearance, no masses or tenderness, No nipple retraction or dimpling, No nipple discharge or bleeding, No axillary or supraclavicular adenopathy Heart: regular rate and rhythm Abdomen: soft, non-tender; no masses,  no organomegaly Extremities: extremities normal, atraumatic, no cyanosis or edema Skin:  Skin color, texture, turgor normal. No rashes or lesions Lymph nodes: Cervical, supraclavicular, and axillary nodes normal. No abnormal inguinal nodes palpated Neurologic: Grossly normal   Pelvic: External genitalia:  no lesions, normal female              Urethra:  normal appearing urethra with no masses, tenderness or lesions              Bartholin's and Skene's: normal                 Vagina: normal appearing vagina with normal color and discharge, no lesions              Cervix: no cervical motion tenderness,  no lesions and normal appearance with IUD string noted in cervix appropriate length              Pap taken: Yes.   Bimanual Exam:  Uterus:  normal size, contour, position, consistency, mobility, non-tender and anteverted              Adnexa: normal adnexa and no mass, fullness, tenderness               Rectovaginal: Confirms               Anus:  normal sphincter tone, no lesions  Chaperone present: yes  A:  Well Woman with normal exam  Contraception Paragard IUD, condoms  Amenorrhea x 4 months, negative UPT today  STD screening  History of DVT from OCP use  Weight gain and working on exercise and diet change to decrease  Family history of Ovarian and Uterine cancer sisters age 46's  History of depression no medication now  P:   Reviewed health and wellness pertinent to exam  Discussed Paragard still present with IUD string noted. Due for removal 12/2023  Discussed that amenorrhea can be due to thyroid, pituitary, weight change or hormonal changes. Need to evaluate with lab. Patient agreeable. If  Normal may need Provera challenge. Patient agreeable.  Lab:TSH,Prolactin, [place]  Labs:Affirm, STD panel, Hep C, Gc/chlamydia  Discussed no definitive screening for ovarian cancer, but PUS and CA125 are used yearly if patient desires. Uterine cancer can sometimes be detected with pap smear if cells are shed that are unusual. Would recommend genetic consult. Patient would like referral for. She will be contacted with information.  Pap smear: yes   counseled on breast self exam, STD prevention, HIV risk factors and prevention, feminine hygiene, adequate intake of calcium and vitamin D, diet and exercise  return annually or prn  An After Visit Summary was printed and given to the patient.

## 2018-10-11 LAB — VAGINITIS/VAGINOSIS, DNA PROBE
Candida Species: NEGATIVE
GARDNERELLA VAGINALIS: POSITIVE — AB
TRICHOMONAS VAG: NEGATIVE

## 2018-10-11 LAB — HEP, RPR, HIV PANEL
HIV SCREEN 4TH GENERATION: NONREACTIVE
Hepatitis B Surface Ag: NEGATIVE
RPR Ser Ql: NONREACTIVE

## 2018-10-11 LAB — PROLACTIN: Prolactin: 14.6 ng/mL (ref 4.8–23.3)

## 2018-10-11 LAB — TSH: TSH: 1.93 u[IU]/mL (ref 0.450–4.500)

## 2018-10-11 LAB — FOLLICLE STIMULATING HORMONE: FSH: 5.9 m[IU]/mL

## 2018-10-11 LAB — HEPATITIS C ANTIBODY: Hep C Virus Ab: 0.1 s/co ratio (ref 0.0–0.9)

## 2018-10-13 ENCOUNTER — Telehealth: Payer: Self-pay | Admitting: Certified Nurse Midwife

## 2018-10-13 DIAGNOSIS — Z8041 Family history of malignant neoplasm of ovary: Secondary | ICD-10-CM

## 2018-10-13 DIAGNOSIS — R1032 Left lower quadrant pain: Secondary | ICD-10-CM

## 2018-10-13 DIAGNOSIS — Z8049 Family history of malignant neoplasm of other genital organs: Secondary | ICD-10-CM

## 2018-10-13 DIAGNOSIS — N912 Amenorrhea, unspecified: Secondary | ICD-10-CM

## 2018-10-13 MED ORDER — METRONIDAZOLE 0.75 % VA GEL: VAGINAL | 0 refills | Status: DC

## 2018-10-13 NOTE — Telephone Encounter (Signed)
-----   Message from Regina Eck, CNM sent at 10/11/2018 11:55 AM EDT ----- Notify patient her Hep b,c, HIV and RPR are negative Prolactin is normal, TSH is normal and FSH shows normal range Due to know period since 5/19 and negative UPT in office and Paragard IUD string noted, needs Provera 10 mg x 10 days notify if bleeding or bleeding up to 2 weeks after completion, this to make sure no unhealthy lining present. Will need to continue to keep menses calendar and advise if no periods after Provera use Vaginal screen was negative for yeast and trichomonas. Positive for BV  Can have Flagyl 500 mg bid x 7 or Metrogel every hs x 7 for treatment

## 2018-10-13 NOTE — Telephone Encounter (Signed)
Message left to return call to Reizel Calzada at [phone].    

## 2018-10-13 NOTE — Telephone Encounter (Signed)
Late entry for 1500: Pt returned call. She states since appointment with Melvia Heaps CNM she continues to have LLQ pain and has developed vaginal bleeding. She is wearing a super tampon and feels like this "may be a period" but is not sure.   She is tearful intermittently about pain, states she saw her PCP at [facility] and her PCP did not address her pain and asked why gyn did not perform an ultrasound.   Discussed results and message from [place].  Pt would like to wait on starting medications at this time.  Advised can review with Dr. Talbert Nan and see if okay to schedule Pelvic ultrasound .  Okay with Dr. Talbert Nan and ultrasound is schedule for tomorrow in office. Pt is very pleased with this plan.  She is advised to call back with any concerns prior to appointment tomorrow.  Instructions regarding metrogel given. Pt wants to wait on Metrogel and Provera until seen by Dr. Talbert Nan for ultrasound tomorrow.  Routing to dr. Talbert Nan and Melvia Heaps CNM and will close.

## 2018-10-13 NOTE — Telephone Encounter (Signed)
Patient called requesting a call back from the nurse about some changes she is experiencing since her last visit. She said she is having black tarry discharge and increased pain on her left side.  Last seen: 10/10/18

## 2018-10-14 ENCOUNTER — Other Ambulatory Visit: Payer: Self-pay

## 2018-10-14 ENCOUNTER — Telehealth: Payer: Self-pay | Admitting: Emergency Medicine

## 2018-10-14 ENCOUNTER — Encounter: Payer: Self-pay | Admitting: Obstetrics and Gynecology

## 2018-10-14 ENCOUNTER — Ambulatory Visit (INDEPENDENT_AMBULATORY_CARE_PROVIDER_SITE_OTHER): Payer: 59

## 2018-10-14 ENCOUNTER — Ambulatory Visit (INDEPENDENT_AMBULATORY_CARE_PROVIDER_SITE_OTHER): Payer: 59 | Admitting: Obstetrics and Gynecology

## 2018-10-14 VITALS — BP 130/84 | HR 88 | Wt 199.2 lb

## 2018-10-14 DIAGNOSIS — M7918 Myalgia, other site: Secondary | ICD-10-CM

## 2018-10-14 DIAGNOSIS — Z8041 Family history of malignant neoplasm of ovary: Secondary | ICD-10-CM

## 2018-10-14 DIAGNOSIS — T8332XA Displacement of intrauterine contraceptive device, initial encounter: Secondary | ICD-10-CM

## 2018-10-14 DIAGNOSIS — R6882 Decreased libido: Secondary | ICD-10-CM | POA: Diagnosis not present

## 2018-10-14 DIAGNOSIS — R109 Unspecified abdominal pain: Secondary | ICD-10-CM | POA: Diagnosis not present

## 2018-10-14 DIAGNOSIS — Z30432 Encounter for removal of intrauterine contraceptive device: Secondary | ICD-10-CM

## 2018-10-14 DIAGNOSIS — N912 Amenorrhea, unspecified: Secondary | ICD-10-CM

## 2018-10-14 DIAGNOSIS — Z8049 Family history of malignant neoplasm of other genital organs: Secondary | ICD-10-CM | POA: Diagnosis not present

## 2018-10-14 DIAGNOSIS — R1032 Left lower quadrant pain: Secondary | ICD-10-CM

## 2018-10-14 DIAGNOSIS — Z3009 Encounter for other general counseling and advice on contraception: Secondary | ICD-10-CM

## 2018-10-14 DIAGNOSIS — R102 Pelvic and perineal pain: Secondary | ICD-10-CM

## 2018-10-14 LAB — GC/CHLAMYDIA PROBE AMP
Chlamydia trachomatis, NAA: NEGATIVE
NEISSERIA GONORRHOEAE BY PCR: NEGATIVE

## 2018-10-14 LAB — CYTOLOGY - PAP
Diagnosis: NEGATIVE
HPV: NOT DETECTED

## 2018-10-14 MED ORDER — NAPROXEN SODIUM 550 MG PO TABS: ORAL_TABLET | ORAL | 1 refills | Status: DC

## 2018-10-14 MED ORDER — MEDROXYPROGESTERONE ACETATE 10 MG PO TABS: 10.0000 mg | ORAL_TABLET | Freq: Every day | ORAL | 0 refills | Status: DC

## 2018-10-14 NOTE — Addendum Note (Signed)
Addended by: Michele Mcalpine on: 10/14/2018 05:34 PM   Modules accepted: Orders

## 2018-10-14 NOTE — Progress Notes (Signed)
GYNECOLOGY  VISIT   HPI: 37 y.o.   Single White or Caucasian Not Hispanic or Latino  female   G0P0000 with No LMP recorded.   here for evaluation of LLQ abdominal/pelvic pain and amenorrhea.  The patient c/o a 2 month h/o progressively worsening, intermittent LLQ abdominal/pelvic pain. Doesn't hurt when she is sleeping, throbs the more she moves around, shooting burning pain with increased activity, but even sitting hurts. Goes from a pinching, burning pain to a shooting pain to aching. Ranges from a 2-7/10 in severity. Takes Aleve which helps. Pain gets much worse when she bends or lifts.  She has a paragard IUD, c/o amenorrhea since 5/19. Prior to that cycles were mostly monthly with an occasional skipped cycle. Lab work with Ms Hollice Espy showed a normal TSH, FSH of 5.9, a normal prolactin and a negative UPT. She is dating, current partner x 3 months. Using condoms. He has other partners.  Got divorced last year. She is under stress, but no more than normal and less than last year.  She has had memory fog and fatigue. She has trouble staying asleep. She is on a waiting list to see a Endocrinologist.  No acne, no hirsutism, no hot flashes or night sweats.  She c/o an abnormally low libido. Able to enjoy sex and have an orgasm.   GYNECOLOGIC HISTORY: No LMP recorded. Contraception:Paragard IUD Menopausal hormone therapy: NA        OB History    Gravida  0   Para  0   Term  0   Preterm  0   AB  0   Living  0     SAB  0   TAB  0   Ectopic  0   Multiple  0   Live Births  0              Patient Active Problem List   Diagnosis Date Noted  . History of DVT (deep vein thrombosis) 06/21/2015  . Healthcare maintenance 12/01/2014    Past Medical History:  Diagnosis Date  . Abnormal Pap smear of cervix    age 65 or 46  . Anemia   . Anxiety   . Depression   . DVT (deep venous thrombosis) ([place])   . Migraines   . Urinary incontinence     Past Surgical History:   Procedure Laterality Date  . CRYOTHERAPY     for abnormal pap smear- age 45 or 44  . INTRAUTERINE DEVICE INSERTION     paragard    Current Outpatient Medications  Medication Sig Dispense Refill  . Cholecalciferol (VITAMIN D PO) Take 2,000 Int'l Units by mouth.    . loratadine (CLARITIN) 10 MG tablet Take 10 mg by mouth daily.    . Multiple Vitamins-Minerals (MULTIVITAMIN PO) Take by mouth.    . metroNIDAZOLE (METROGEL) 0.75 % vaginal gel Place one applicator full PV at hs for 7 nights. (Patient not taking: Reported on 10/14/2018) 70 g 0   No current facility-administered medications for this visit.      ALLERGIES: Aspirin and Latex  Family History  Problem Relation Age of Onset  . Kidney cancer Mother   . Diabetes Father   . Heart disease Father   . Stroke Father   . Ovarian cancer Sister        uterine  . Diabetes Paternal Aunt   . Heart disease Maternal Grandfather   . Heart attack Maternal Grandfather   . Diabetes Paternal Grandmother   .  Diabetes Paternal Grandfather   . Stroke Paternal Grandfather   . Heart attack Paternal Grandfather   . Ovarian cancer Sister        ovarian     Social History   Socioeconomic History  . Marital status: Single    Spouse name: Not on file  . Number of children: Not on file  . Years of education: Not on file  . Highest education level: Not on file  Occupational History  . Not on file  Social Needs  . Financial resource strain: Not on file  . Food insecurity:    Worry: Not on file    Inability: Not on file  . Transportation needs:    Medical: Not on file    Non-medical: Not on file  Tobacco Use  . Smoking status: Never Smoker  . Smokeless tobacco: Never Used  Substance and Sexual Activity  . Alcohol use: Yes    Alcohol/week: 0.0 - 3.0 standard drinks  . Drug use: Not Currently  . Sexual activity: Yes    Partners: Male, Female    Birth control/protection: None  Lifestyle  . Physical activity:    Days per week: Not  on file    Minutes per session: Not on file  . Stress: Not on file  Relationships  . Social connections:    Talks on phone: Not on file    Gets together: Not on file    Attends religious service: Not on file    Active member of club or organization: Not on file    Attends meetings of clubs or organizations: Not on file    Relationship status: Not on file  . Intimate partner violence:    Fear of current or ex partner: Not on file    Emotionally abused: Not on file    Physically abused: Not on file    Forced sexual activity: Not on file  Other Topics Concern  . Not on file  Social History Narrative  . Not on file    Review of Systems  Constitutional:       Weight gain  HENT: Negative.   Eyes: Negative.   Respiratory: Negative.   Cardiovascular: Negative.   Gastrointestinal: Positive for abdominal pain.  Genitourinary: Negative.        Unscheduled bleeding Loss of sexual interest  Musculoskeletal: Negative.   Skin: Negative.   Neurological: Positive for headaches.  Endo/Heme/Allergies: Negative.   Psychiatric/Behavioral:       Excessive crying    PHYSICAL EXAMINATION:    BP 130/84 (BP Location: Right Arm, Patient Position: Sitting, Cuff Size: Normal)   Pulse 88   Wt 199 lb 3.2 oz (90.4 kg)   BMI 36.73 kg/m     General appearance: alert, cooperative and appears stated age Abdomen: soft, very tender to palpation in the lower, lateral LLQ. More tender with palpation on tensed abdominal wall, no rebound, no guarding. Non distended, no masses,  no organomegaly  Pelvic: External genitalia:  no lesions              Urethra:  normal appearing urethra with no masses, tenderness or lesions              Bartholins and Skenes: normal                 Vagina: normal appearing vagina with normal color and discharge, no lesions              Cervix: no cervical motion tenderness, no lesions  and IUD removed with ringed forceps (felt slightly stuck, came out intact)               Bimanual Exam:  Uterus:  normal size, contour, position, consistency, mobility, non-tender and anteverted              Adnexa: no masses, not tender with vaginal palpation. Only tender with abdominal palpation laterally in the LLQ.               Rectovaginal: not examined  Bladder: not tender  Pelvic floor: not tender  Chaperone was present for exam.  ASSESSMENT Amenorrhea, normal blood work IUD malpositioned, but not the source of pain LLQ abdominal pain. Area of pain on ultrasound and exam is over her abdominal wall. No hernias noted Low libido    PLAN Start the provera as prescribed by Ms Hollice Espy, call with or without a cycle Check estradiol level Given information on MS pain, can use ice, heat, rest Anaprox DS q 12 hours x 1 week, then prn If not significantly better in one week will f/u with PT (she would like to set up an appointment now) Check testosterone panel Use condoms for right now, information given on the mirena (reviewed) Reviewed CDC contraception information with her h/o DVT, mirena IUD is rated as a 2 (benifet outweighs risk)   An After Visit Summary was printed and given to the patient.  Over 25 minutes face to face time of which over 50% was spent in counseling.   CC: Evalee Mutton, CNM

## 2018-10-14 NOTE — Telephone Encounter (Signed)
Message left to request call back.  Reviewed Dr. Albertina Senegal office visit note.  Pt needs order for provera.  Order sent to [facility].   Requested patient call back to ensure she has all medications and instructions.

## 2018-10-14 NOTE — Patient Instructions (Signed)
Musculoskeletal Pain  Musculoskeletal pain is muscle and bone aches and pains. This pain can occur in any part of the body.  Follow these instructions at home:  · Only take medicines for pain, discomfort, or fever as told by your health care provider.  · You may continue all activities unless the activities cause more pain. When the pain lessens, slowly resume normal activities. Gradually increase the intensity and duration of the activities or exercise.  · During periods of severe pain, bed rest may be helpful. Lie or sit in any position that is comfortable, but get out of bed and walk around at least every several hours.  · If directed, put ice on the injured area.  ? Put ice in a plastic bag.  ? Place a towel between your skin and the bag.  ? Leave the ice on for 20 minutes, 2-3 times a day.  Contact a health care provider if:  · Your pain is getting worse.  · Your pain is not relieved with medicines.  · You lose function in the area of the pain if the pain is in your arms, legs, or neck.  This information is not intended to replace advice given to you by your health care provider. Make sure you discuss any questions you have with your health care provider.  Document Released: 12/10/2005 Document Revised: 05/22/2016 Document Reviewed: 08/14/2013  Elsevier Interactive Patient Education © 2017 [employer] Inc[employer]

## 2018-10-15 ENCOUNTER — Encounter: Payer: Self-pay | Admitting: Licensed Clinical Social Worker

## 2018-10-15 ENCOUNTER — Telehealth: Payer: Self-pay | Admitting: Licensed Clinical Social Worker

## 2018-10-15 NOTE — Telephone Encounter (Signed)
New genetic counseling referral received from Dr. Hollice Espy for fhx of ovarian and uterine cancer. Pt has been cld and scheduled to see Faith Rogue on 11/11 at 1pm. Pt aware to arrive 15 minutes early. Address verified. Letter mailed.

## 2018-10-16 NOTE — Telephone Encounter (Signed)
Call to patient. She will pick up provera and start as soon as possible.  Advised that both Dr. Talbert Nan and Melvia Heaps CNM want her to start to induce a period.  Call back if no cycle within 14 days after finishing the 10 day course of treatment.  Patient agreeable to plan.  Will call back with any concerns. Encounter to Melvia Heaps CNM and will close.

## 2018-10-16 NOTE — Telephone Encounter (Signed)
Message left to return call to Savannah George at [phone].    

## 2018-10-17 LAB — TESTT+TESTF+SHBG
SEX HORMONE BINDING: 106 nmol/L (ref 24.6–122.0)
Testosterone, Free: 2.7 pg/mL (ref 0.0–4.2)
Testosterone, total: 52.8 ng/dL (ref 10.0–55.0)

## 2018-10-17 LAB — ESTRADIOL: Estradiol: 379.9 pg/mL

## 2018-10-20 ENCOUNTER — Telehealth: Payer: Self-pay | Admitting: Certified Nurse Midwife

## 2018-10-20 NOTE — Telephone Encounter (Signed)
Patient left voicemail over lunch stating that she had a missed call from our office, but was unable to hear the voicemail. No open phone note.

## 2018-10-20 NOTE — Telephone Encounter (Signed)
Patient returned call

## 2018-10-20 NOTE — Telephone Encounter (Signed)
Spoke with patient, advised of normal lab  Results, DOS 10/14/18.   Patient verbalizes understanding and is agreeable.   Encounter closed.

## 2018-10-20 NOTE — Telephone Encounter (Signed)
Left message to call Sharee Pimple, RN at [facility].    K. Sprague, RN left msg on 10/28 with results DOS 10/14/18

## 2018-10-23 NOTE — Progress Notes (Signed)
Savannah George is a 37 y.o. female is here to establish care.  History of Present Illness:   HPI: See Assessment and Plan section for Problem Based Charting of issues discussed today.   Health Maintenance Due  Topic Date Due  . TETANUS/TDAP  01/13/1999   No flowsheet data found. PMHx, SurgHx, SocialHx, FamHx, Medications, and Allergies were reviewed in the Visit Navigator and updated as appropriate.   Patient Active Problem List   Diagnosis Date Noted  . Sleep-disordered breathing 10/25/2018  . Obesity (BMI 35.0-39.9 without comorbidity) 10/25/2018  . Situational depression 10/25/2018  . Amenorrhea 10/25/2018  . Family history of ovarian cancer 10/25/2018  . Pure hypercholesterolemia 10/25/2018  . Seasonal allergies 10/25/2018   Social History   Tobacco Use  . Smoking status: Never Smoker  . Smokeless tobacco: Never Used  Substance Use Topics  . Alcohol use: Yes    Alcohol/week: 0.0 - 3.0 standard drinks  . Drug use: Not Currently   Current Medications and Allergies:   .  Cholecalciferol (VITAMIN D PO), Take 2,000 Int'l Units by mouth., Disp: , Rfl:  .  loratadine (CLARITIN) 10 MG tablet, Take 10 mg by mouth daily., Disp: , Rfl:  .  medroxyPROGESTERone (PROVERA) 10 MG tablet, Take 1 tablet (10 mg total) by mouth daily., Disp: 10 tablet, Rfl: 0 .  metroNIDAZOLE (METROGEL) 0.75 % vaginal gel, Place one applicator full PV at hs for 7 nights. (Patient not taking: Reported on 10/14/2018), Disp: 70 g, Rfl: 0 .  Multiple Vitamins-Minerals (MULTIVITAMIN PO), Take by mouth., Disp: , Rfl:  .  naproxen sodium (ANAPROX DS) 550 MG tablet, Take one tablet a day BID for one week, then q 12 hours prn., Disp: 30 tablet, Rfl: 1   Allergies  Allergen Reactions  . Aspirin Hives  . Latex Hives   Review of Systems   Pertinent items are noted in the HPI. Otherwise, ROS is negative.  Vitals:   Vitals:   10/24/18 0911  BP: 128/88  Pulse: 75  Resp: 16  Temp: 98.1 F (36.7 C)    TempSrc: Oral  SpO2: 98%  Weight: 197 lb (89.4 kg)  Height: 5' 1.75" (1.568 m)     Body mass index is 36.32 kg/m.  Physical Exam:   Physical Exam  Constitutional: She appears well-nourished.  HENT:  Head: Normocephalic and atraumatic.  Eyes: Pupils are equal, round, and reactive to light. EOM are normal.  Neck: Normal range of motion. Neck supple.  Cardiovascular: Normal rate, regular rhythm, normal heart sounds and intact distal pulses.  Pulmonary/Chest: Effort normal.  Abdominal: Soft.  Skin: Skin is warm.  Psychiatric: She has a normal mood and affect. Her behavior is normal.  Nursing note and vitals reviewed.  Assessment and Plan:   Patient Active Problem List   Diagnosis Date Noted  . Sleep-disordered breathing 10/25/2018  . Obesity (BMI 35.0-39.9 without comorbidity) 10/25/2018  . Situational depression 10/25/2018  . Amenorrhea 10/25/2018  . Family history of ovarian cancer 10/25/2018  . Pure hypercholesterolemia 10/25/2018  . Seasonal allergies 10/25/2018   Sleep-disordered breathing Patent has a a few year history of symptoms of daytime fatigue, morning headache and hypertension. Snoring of mild severity is present. Plan: Snoring episodes. This is a new problem. This is suspicious for sleep apnea. Additional work-up is planned. I will refer the patient to a sleep specialist for further evaluation and management.   Situational depression Current symptoms include depressed mood, fatigue and hypersomnia. Symptoms have been unchanged since that time.  Patient denies recurrent thoughts of death. Depression, unchanged.    Plan:  1. Medications: patient declines at this time. 2. Labs: per orders. 3. List of counselors provided. 4. Instructed patient to contact office or on-call physician promptly should condition worsen or any new symptoms appear. IF THE PATIENT HAS ANY SUICIDAL OR HOMICIDAL IDEATIONS, CALL THE OFFICE, DISCUSS WITH A SUPPORT MEMBER, OR GO TO THE ER  IMMEDIATELY. Patient was agreeable with this plan.   Pure hypercholesterolemia History of the same. The patient is asked to make an attempt to improve diet and exercise patterns to aid in medical management of this problem. Will recheck when due.   Obesity (BMI 35.0-39.9 without comorbidity) Wt Readings from Last 3 Encounters:  10/24/18 197 lb (89.4 kg)  10/14/18 199 lb 3.2 oz (90.4 kg)  10/10/18 197 lb (89.4 kg)   Plan: The patient is asked to make an attempt to improve diet and exercise patterns to aid in medical management of this problem. Recheck at next visit.    Amenorrhea Followed by OBGYN. Notes reviewed.  Orders Placed This Encounter  Procedures  . Ambulatory referral to Sleep Studies   . Reviewed expectations re: course of current medical issues. . Discussed self-management of symptoms. . Outlined signs and symptoms indicating need for more acute intervention. . Patient verbalized understanding and all questions were answered. Marland Kitchen Health Maintenance issues including appropriate healthy diet, exercise, and smoking avoidance were discussed with patient. . See orders for this visit as documented in the electronic medical record. . Patient received an After Visit Summary.  CMA served as Education administrator during this visit. History, Physical, and Plan performed by medical provider. The above documentation has been reviewed and is accurate and complete. Briscoe Deutscher, D.O.  Briscoe Deutscher, DO [place], [place] 11/01/2018

## 2018-10-24 ENCOUNTER — Ambulatory Visit: Payer: 59 | Admitting: Family Medicine

## 2018-10-24 ENCOUNTER — Encounter: Payer: Self-pay | Admitting: Family Medicine

## 2018-10-24 VITALS — BP 128/88 | HR 75 | Temp 98.1°F | Resp 16 | Ht 61.75 in | Wt 197.0 lb

## 2018-10-24 DIAGNOSIS — E669 Obesity, unspecified: Secondary | ICD-10-CM | POA: Diagnosis not present

## 2018-10-24 DIAGNOSIS — N912 Amenorrhea, unspecified: Secondary | ICD-10-CM | POA: Diagnosis not present

## 2018-10-24 DIAGNOSIS — F4321 Adjustment disorder with depressed mood: Secondary | ICD-10-CM

## 2018-10-24 DIAGNOSIS — G4730 Sleep apnea, unspecified: Secondary | ICD-10-CM | POA: Diagnosis not present

## 2018-10-24 DIAGNOSIS — J302 Other seasonal allergic rhinitis: Secondary | ICD-10-CM

## 2018-10-24 DIAGNOSIS — Z8041 Family history of malignant neoplasm of ovary: Secondary | ICD-10-CM

## 2018-10-24 DIAGNOSIS — E7800 Pure hypercholesterolemia, unspecified: Secondary | ICD-10-CM

## 2018-10-24 DIAGNOSIS — Z86718 Personal history of other venous thrombosis and embolism: Secondary | ICD-10-CM

## 2018-10-25 ENCOUNTER — Encounter: Payer: Self-pay | Admitting: Family Medicine

## 2018-10-25 DIAGNOSIS — Z8041 Family history of malignant neoplasm of ovary: Secondary | ICD-10-CM | POA: Insufficient documentation

## 2018-10-25 DIAGNOSIS — F321 Major depressive disorder, single episode, moderate: Secondary | ICD-10-CM | POA: Insufficient documentation

## 2018-10-25 DIAGNOSIS — G479 Sleep disorder, unspecified: Secondary | ICD-10-CM | POA: Insufficient documentation

## 2018-10-25 DIAGNOSIS — F4321 Adjustment disorder with depressed mood: Secondary | ICD-10-CM | POA: Insufficient documentation

## 2018-10-25 DIAGNOSIS — J302 Other seasonal allergic rhinitis: Secondary | ICD-10-CM | POA: Insufficient documentation

## 2018-10-25 DIAGNOSIS — E7800 Pure hypercholesterolemia, unspecified: Secondary | ICD-10-CM | POA: Insufficient documentation

## 2018-10-25 DIAGNOSIS — N912 Amenorrhea, unspecified: Secondary | ICD-10-CM | POA: Insufficient documentation

## 2018-10-27 ENCOUNTER — Encounter: Payer: Self-pay | Admitting: Physical Therapy

## 2018-10-27 ENCOUNTER — Other Ambulatory Visit: Payer: Self-pay

## 2018-10-27 ENCOUNTER — Ambulatory Visit: Payer: 59 | Attending: Obstetrics and Gynecology | Admitting: Physical Therapy

## 2018-10-27 DIAGNOSIS — M6281 Muscle weakness (generalized): Secondary | ICD-10-CM | POA: Diagnosis present

## 2018-10-27 DIAGNOSIS — R293 Abnormal posture: Secondary | ICD-10-CM | POA: Diagnosis not present

## 2018-10-27 DIAGNOSIS — M62838 Other muscle spasm: Secondary | ICD-10-CM | POA: Diagnosis present

## 2018-10-27 NOTE — Therapy (Signed)
[place], [place], [address] Phone: [phone]   Fax:  [phone]  Physical Therapy Evaluation  Patient Details  Name: Savannah George MRN: [id number] Date of Birth: 1981-05-28 Referring Provider (PT): Salvadore Dom MD   Encounter Date: 10/27/2018  PT End of Session - 10/27/18 0943    Visit Number  1    Number of Visits  13    Date for PT Re-Evaluation  12/08/18    PT Start Time  0938   pt arrived 8 min late   PT Stop Time  1018    PT Time Calculation (min)  40 min    Activity Tolerance  Patient tolerated treatment well    Behavior During Therapy  [facility] for tasks assessed/performed       Past Medical History:  Diagnosis Date  . Abnormal Pap smear of cervix    age 50 or 14  . Anemia   . Anxiety   . Depression   . DVT (deep venous thrombosis) ([place])   . Migraines   . Urinary incontinence     Past Surgical History:  Procedure Laterality Date  . CRYOTHERAPY     For abnormal pap smear, age 49 or 89  . INTRAUTERINE DEVICE INSERTION     PARAGUARD    There were no vitals filed for this visit.   Subjective Assessment - 10/27/18 0949    Subjective  pt is a 37 y.o F with CC of radiating pain in the Lower abdominal quadrange that started back in August with no speifici MOI. She reports pain that can radiate to the back. the pain is usually there that fluctuates in nature which when it is really flared up it limited bending lifting and moving. she does report having have urinary incontinance but states it did resolve before this started.     Limitations  Lifting;Standing;Walking;Other (comment)   bending forward   How long can you sit comfortably?  can vary depending when it occurs    How long can you stand comfortably?  unlimited    How long can you walk comfortably?  when it is aggrivated only a few minutes    Diagnostic tests  [place] at OBGYN    Patient Stated Goals  to figure out what is going  on, how to prevent from occuring.     Currently in Pain?  Yes    Pain Score  1    at worst 6-7/10   Pain Location  Abdomen    Pain Orientation  Left;Lower    Pain Descriptors / Indicators  Burning;Shooting;Aching   pinching   Pain Type  Chronic pain    Pain Radiating Towards  reportgs it radiates to the L low back     Pain Onset  More than a month ago    Pain Frequency  Constant    Aggravating Factors   bending forward, lifting, carrying, pushing     Pain Relieving Factors  sitting and resting,     Effect of Pain on Daily Activities  limited walking/ standing.          [facility] PT Assessment - 10/27/18 0944      Assessment   Medical Diagnosis  abdominal strain    Referring Provider (PT)  Salvadore Dom MD    Onset Date/Surgical Date  --   August   Hand Dominance  Right    Next MD Visit  Make PRN    Prior Therapy  no  Precautions   Precautions  None      Restrictions   Weight Bearing Restrictions  No      Balance Screen   Has the patient fallen in the past 6 months  No    Has the patient had a decrease in activity level because of a fear of falling?   No    Is the patient reluctant to leave their home because of a fear of falling?   No      Home Film/video editor residence    Living Arrangements  Spouse/significant other    Available Help at Discharge  Family    Type of University to enter    Entrance Stairs-Number of Steps  2    Entrance Stairs-Rails  Left   ascending   Home Layout  One level      Prior Function   Level of Independence  Independent with basic ADLs    Vocation  Full time employment   librarian   Vocation Requirements  prolonged standing/ walking, lifting    Leisure  walking, camping, kayaking, gradening      Cognition   Overall Cognitive Status  Within Functional Limits for tasks assessed      Observation/Other Assessments   Focus on Therapeutic Outcomes (FOTO)   49% limited     predicted 32% limited     Posture/Postural Control   Posture/Postural Control  Postural limitations    Postural Limitations  Rounded Shoulders;Forward head;Increased lumbar lordosis      ROM / Strength   AROM / PROM / Strength  AROM;Strength      AROM   AROM Assessment Site  Lumbar    Lumbar Flexion  20   ERP   Lumbar Extension  40    Lumbar - Right Side Bend  20    Lumbar - Left Side Bend  18   felt like a pulling sensation at end range     Strength   Strength Assessment Site  Lumbar;Other (comment)      Palpation   SI assessment   potential anterior rotation of the L innominate    Palpation comment  TTP along the ASIS and tightness along the rectus femoris, soreness along the PSIS on the L,  ASIS on the left is lower than the R,      Special Tests    Special Tests  Lumbar;Sacrolliac Tests    Lumbar Tests  Straight Leg Raise    Sacroiliac Tests   Sacral Thrust   gillets (+) on L      Straight Leg Raise   Findings  Negative    Side   Left      Sacral thrust    Findings  Positive    Side  Left      Ambulation/Gait   Ambulation/Gait  Yes    Gait Pattern  Step-through pattern;Decreased stride length;Trendelenburg;Antalgic;Decreased trunk rotation                Objective measurements completed on examination: See above findings.      [facility] Adult PT Treatment/Exercise - 10/27/18 0944      Exercises   Exercises  Knee/Hip      Knee/Hip Exercises: Stretches   Hip Flexor Stretch  2 reps;30 seconds      Knee/Hip Exercises: Seated   Hamstring Curl  2 sets;10 reps      Manual Therapy   Manual Therapy  Muscle Energy Technique    Manual therapy comments  MTPR along the L rectus femoris working proximal to distal    Muscle Energy Technique  scissor technique resisted L hamstring and R hip flexor 5 x 10 sec hold             PT Education - 10/27/18 0943    Education Details  evaluation findings, POC, goals, HEP with proper form/ rationale, anatomy  of the area involved    Person(s) Educated  Patient    Methods  Explanation;Verbal cues    Comprehension  Verbalized understanding;Verbal cues required       PT Short Term Goals - 10/27/18 1300      PT SHORT TERM GOAL #1   Title  pt to be I with inital HEP     Time  3    Period  Weeks    Status  New    Target Date  11/17/18        PT Long Term Goals - 10/27/18 1300      PT LONG TERM GOAL #1   Title  pt to verbalize and demo proper posture with standing and lifting to reduce and prevent hip/ abdominal and back pain     Time  6    Period  Weeks    Status  New    Target Date  12/08/18      PT LONG TERM GOAL #2   Title  pt to be able to sit/ stand and walk >/= 60 min reporting </= 1/10 pain to promote functional endurance for work     Time  6    Period  Weeks    Status  New    Target Date  12/08/18      PT LONG TERM GOAL #3   Title  pt to return to gardening, hiking and outdoor activities with </= 1/10 pain for pt's personal goal    Time  6    Period  Weeks    Status  New    Target Date  12/08/18      PT LONG TERM GOAL #4   Title  increase FOTO score to </= 32% limited to demo improvement in function     Time  6    Period  Weeks    Status  New    Target Date  12/08/18      PT LONG TERM GOAL #5   Title  pt to be I with all HEP given to maintain and progress current level of function    Time  6    Period  Weeks    Status  New    Target Date  12/08/18             Plan - 10/27/18 1255    Clinical Impression Statement  pt is a 37 y.o F presenting to [facility] with CC of L lower abdominal pain starting in August with no specific MOI. limited trunk flexion and pain reproduced with L side bending \ flexion. TTP noted a tthe ASIS on the L and muscle tightness in the L rectus femoris. She demonstrates positive SIJ testing and exhibits findings consistent with a anterior innominate rotation on the L. She would benefit from physical therapy to decrease L hip flexor pain,  improve mobility, promote functional posture and maximize her function by addressing the deficits listed.     History and Personal Factors relevant to plan of care:  hx of anxiety and DVT  Clinical Presentation  Evolving    Clinical Presentation due to:  fluctuating pain, abnormal posture, tightness in rectus femoris,     Clinical Decision Making  Moderate    Rehab Potential  Good    PT Frequency  2x / week    PT Duration  6 weeks    PT Treatment/Interventions  ADLs/Self Care Home Management;Cryotherapy;Electrical Stimulation;Iontophoresis 4mg /ml Dexamethasone;Moist Heat;Ultrasound;Joint Manipulations;Passive range of motion;Patient/family education;Gait training;Stair training;Therapeutic activities;Therapeutic exercise;Manual techniques;Taping;Dry needling;Balance training;Neuromuscular re-education    PT Next Visit Plan  review/ update HEP, innomninate anterior rotation on L, STW along rectus femoris/ hip flexors, hamstring activation, core strengthening, posture education, modalities PRN     PT Home Exercise Plan  hip flexor stretching (both standing and supine), hamstring strengthening in sitting,     Consulted and Agree with Plan of Care  Patient       Patient will benefit from skilled therapeutic intervention in order to improve the following deficits and impairments:  Obesity, Pain, Decreased strength, Abnormal gait, Decreased endurance, Decreased balance, Decreased activity tolerance, Increased muscle spasms, Increased fascial restricitons, Postural dysfunction, Improper body mechanics, Decreased range of motion  Visit Diagnosis: Muscle weakness (generalized) - Plan: PT plan of care cert/re-cert  Abnormal posture - Plan: PT plan of care cert/re-cert  Other muscle spasm - Plan: PT plan of care cert/re-cert     Problem List Patient Active Problem List   Diagnosis Date Noted  . Sleep-disordered breathing 10/25/2018  . Obesity (BMI 35.0-39.9 without comorbidity) 10/25/2018  .  Situational depression 10/25/2018  . Amenorrhea 10/25/2018  . Family history of ovarian cancer 10/25/2018  . Pure hypercholesterolemia 10/25/2018  . Seasonal allergies 10/25/2018   Starr Lake PT, DPT, LAT, ATC  10/27/18  1:05 PM      [facility] [address] [place], [place], [address] Phone: [phone]   Fax:  [phone]  Name: Savannah George MRN: [id number] Date of Birth: February 03, 1981

## 2018-10-30 ENCOUNTER — Encounter: Payer: Self-pay | Admitting: Physical Therapy

## 2018-10-30 ENCOUNTER — Ambulatory Visit: Payer: 59 | Admitting: Physical Therapy

## 2018-10-30 DIAGNOSIS — M6281 Muscle weakness (generalized): Secondary | ICD-10-CM | POA: Diagnosis not present

## 2018-10-30 DIAGNOSIS — R293 Abnormal posture: Secondary | ICD-10-CM

## 2018-10-30 DIAGNOSIS — M62838 Other muscle spasm: Secondary | ICD-10-CM

## 2018-10-30 NOTE — Therapy (Signed)
[place], [place], [address] Phone: [phone]   Fax:  [phone]  Physical Therapy Treatment  Patient Details  Name: Savannah George MRN: [id number] Date of Birth: November 22, 1981 Referring Provider (PT): Salvadore Dom MD   Encounter Date: 10/30/2018  PT End of Session - 10/30/18 0802    Visit Number  2    Number of Visits  13    Date for PT Re-Evaluation  12/08/18    PT Start Time  0802    PT Stop Time  0852    PT Time Calculation (min)  50 min    Activity Tolerance  Patient tolerated treatment well    Behavior During Therapy  [facility] for tasks assessed/performed       Past Medical History:  Diagnosis Date  . Abnormal Pap smear of cervix    age 52 or 75  . Anemia   . Anxiety   . Depression   . DVT (deep venous thrombosis) ([place])   . Migraines   . Urinary incontinence     Past Surgical History:  Procedure Laterality Date  . CRYOTHERAPY     For abnormal pap smear, age 63 or 65  . INTRAUTERINE DEVICE INSERTION     PARAGUARD    There were no vitals filed for this visit.  Subjective Assessment - 10/30/18 0803    Subjective  "I am doing alittle better today, pain isn't as intense as on the first visit"    Currently in Pain?  Yes    Pain Score  2     Pain Orientation  Left;Lower    Pain Descriptors / Indicators  Aching                       [facility] Adult PT Treatment/Exercise - 10/30/18 0807      Lumbar Exercises: Seated   Other Seated Lumbar Exercises  pushing down with bil UE using pilates circle to work on core strenghening, breathing out while pushing down and inhaling while relaxing      Knee/Hip Exercises: Stretches   Hip Flexor Stretch  2 reps;30 seconds    Gastroc Stretch  2 reps;30 seconds;Both   slant board     Knee/Hip Exercises: Aerobic   Nustep  L5 x 5 min LE only      Knee/Hip Exercises: Seated   Hamstring Curl  2 sets;15 reps   with green theraband     Knee/Hip Exercises: Supine   Straight Leg Raise with External Rotation  10 reps;Left;Strengthening;2 sets      Modalities   Modalities  Moist Heat      Moist Heat Therapy   Number Minutes Moist Heat  10 Minutes    Moist Heat Location  Hip   in supine     Manual Therapy   Manual Therapy  Soft tissue mobilization    Manual therapy comments  skilled palpation and monitoirng of pt throughout TPDN    Soft tissue mobilization  IASTM along rectus femoris on the L    Muscle Energy Technique  scissor technique resisted L hamstring and R hip flexor 5 x 10 sec hold       Trigger Point Dry Needling - 10/30/18 6283    Consent Given?  Yes    Education Handout Provided  Yes    Muscles Treated Lower Body  Quadriceps    Quadriceps Response  Twitch response elicited;Palpable increased muscle length   L rectus femoris  PT Education - 10/30/18 0824    Education Details  muscle anatomy and referral patterns. what TPDN is, benefits what to expect and after care.     Person(s) Educated  Patient    Methods  Explanation;Verbal cues    Comprehension  Verbalized understanding;Verbal cues required       PT Short Term Goals - 10/27/18 1300      PT SHORT TERM GOAL #1   Title  pt to be I with inital HEP     Time  3    Period  Weeks    Status  New    Target Date  11/17/18        PT Long Term Goals - 10/27/18 1300      PT LONG TERM GOAL #1   Title  pt to verbalize and demo proper posture with standing and lifting to reduce and prevent hip/ abdominal and back pain     Time  6    Period  Weeks    Status  New    Target Date  12/08/18      PT LONG TERM GOAL #2   Title  pt to be able to sit/ stand and walk >/= 60 min reporting </= 1/10 pain to promote functional endurance for work     Time  6    Period  Weeks    Status  New    Target Date  12/08/18      PT LONG TERM GOAL #3   Title  pt to return to gardening, hiking and outdoor activities with </= 1/10 pain for pt's personal  goal    Time  6    Period  Weeks    Status  New    Target Date  12/08/18      PT LONG TERM GOAL #4   Title  increase FOTO score to </= 32% limited to demo improvement in function     Time  6    Period  Weeks    Status  New    Target Date  12/08/18      PT LONG TERM GOAL #5   Title  pt to be I with all HEP given to maintain and progress current level of function    Time  6    Period  Weeks    Status  New    Target Date  12/08/18            Plan - 10/30/18 0354    Clinical Impression Statement  pt reports improvement of pain since last visit, and reports she is consistent iwth HEP. educated and consent was given for DN for the L rectus femoris followeed with IASTM. continued working on innominate rotation with MET and core strengthening without activation of hip flexor. utilized MHP end of session to calm down muscle sorenss in hip flexor    PT Treatment/Interventions  ADLs/Self Care Home Management;Cryotherapy;Electrical Stimulation;Iontophoresis 38m/ml Dexamethasone;Moist Heat;Ultrasound;Joint Manipulations;Passive range of motion;Patient/family education;Gait training;Stair training;Therapeutic activities;Therapeutic exercise;Manual techniques;Taping;Dry needling;Balance training;Neuromuscular re-education    PT Next Visit Plan  how was DN, update HEP, innomninate anterior rotation on L, STW along rectus femoris/ hip flexors, hamstring activation, core strengthening, posture education, modalities PRN     PT Home Exercise Plan  hip flexor stretching (both standing and supine), hamstring strengthening in sitting,     Consulted and Agree with Plan of Care  Patient       Patient will benefit from skilled therapeutic intervention in order to improve the  following deficits and impairments:  Obesity, Pain, Decreased strength, Abnormal gait, Decreased endurance, Decreased balance, Decreased activity tolerance, Increased muscle spasms, Increased fascial restricitons, Postural dysfunction,  Improper body mechanics, Decreased range of motion  Visit Diagnosis: Muscle weakness (generalized)  Abnormal posture  Other muscle spasm     Problem List Patient Active Problem List   Diagnosis Date Noted  . Sleep-disordered breathing 10/25/2018  . Obesity (BMI 35.0-39.9 without comorbidity) 10/25/2018  . Situational depression 10/25/2018  . Amenorrhea 10/25/2018  . Family history of ovarian cancer 10/25/2018  . Pure hypercholesterolemia 10/25/2018  . Seasonal allergies 10/25/2018   Starr Lake PT, DPT, LAT, ATC  10/30/18  8:48 AM      [facility] [address] [place], [place], [address] Phone: [phone]   Fax:  [phone]  Name: Serenidy Waltz MRN: [id number] Date of Birth: 08/29/1981

## 2018-11-01 NOTE — Assessment & Plan Note (Signed)
Patent has a a few year history of symptoms of daytime fatigue, morning headache and hypertension. Snoring of mild severity is present. Plan: Snoring episodes. This is a new problem. This is suspicious for sleep apnea. Additional work-up is planned. I will refer the patient to a sleep specialist for further evaluation and management.

## 2018-11-01 NOTE — Assessment & Plan Note (Addendum)
History of the same. The patient is asked to make an attempt to improve diet and exercise patterns to aid in medical management of this problem. Will recheck when due.

## 2018-11-01 NOTE — Assessment & Plan Note (Signed)
Wt Readings from Last 3 Encounters:  10/24/18 197 lb (89.4 kg)  10/14/18 199 lb 3.2 oz (90.4 kg)  10/10/18 197 lb (89.4 kg)   Plan: The patient is asked to make an attempt to improve diet and exercise patterns to aid in medical management of this problem. Recheck at next visit.

## 2018-11-01 NOTE — Assessment & Plan Note (Signed)
Followed by OBGYN. Notes reviewed.

## 2018-11-01 NOTE — Assessment & Plan Note (Signed)
Current symptoms include depressed mood, fatigue and hypersomnia. Symptoms have been unchanged since that time. Patient denies recurrent thoughts of death. Depression, unchanged.    Plan:  1. Medications: patient declines at this time. 2. Labs: per orders. 3. List of counselors provided. 4. Instructed patient to contact office or on-call physician promptly should condition worsen or any new symptoms appear. IF THE PATIENT HAS ANY SUICIDAL OR HOMICIDAL IDEATIONS, CALL THE OFFICE, DISCUSS WITH A SUPPORT MEMBER, OR GO TO THE ER IMMEDIATELY. Patient was agreeable with this plan.

## 2018-11-03 ENCOUNTER — Inpatient Hospital Stay: Payer: 59 | Admitting: Licensed Clinical Social Worker

## 2018-11-03 ENCOUNTER — Inpatient Hospital Stay: Payer: 59

## 2018-11-06 ENCOUNTER — Encounter: Payer: Self-pay | Admitting: Physician Assistant

## 2018-11-06 DIAGNOSIS — R32 Unspecified urinary incontinence: Secondary | ICD-10-CM | POA: Insufficient documentation

## 2018-11-07 ENCOUNTER — Ambulatory Visit: Payer: 59 | Admitting: Physical Therapy

## 2018-11-11 ENCOUNTER — Encounter: Payer: Self-pay | Admitting: Physical Therapy

## 2018-11-11 ENCOUNTER — Ambulatory Visit: Payer: 59 | Admitting: Physical Therapy

## 2018-11-11 DIAGNOSIS — M6281 Muscle weakness (generalized): Secondary | ICD-10-CM

## 2018-11-11 DIAGNOSIS — M62838 Other muscle spasm: Secondary | ICD-10-CM | POA: Diagnosis not present

## 2018-11-11 DIAGNOSIS — R293 Abnormal posture: Secondary | ICD-10-CM

## 2018-11-11 NOTE — Therapy (Signed)
[place], [place], [address] Phone: [phone]   Fax:  [phone]  Physical Therapy Treatment  Patient Details  Name: Savannah George MRN: [id number] Date of Birth: 01-26-1981 Referring Provider (PT): Salvadore Dom MD   Encounter Date: 11/11/2018  PT End of Session - 11/11/18 1458    Visit Number  3    Number of Visits  13    Date for PT Re-Evaluation  12/08/18    PT Start Time  1500    PT Stop Time  1545    PT Time Calculation (min)  45 min    Activity Tolerance  Patient tolerated treatment well    Behavior During Therapy  [facility] for tasks assessed/performed       Past Medical History:  Diagnosis Date  . Abnormal Pap smear of cervix    age 40 or 15  . Anemia   . Anxiety   . Depression   . DVT (deep venous thrombosis) ([place])   . Migraines   . Urinary incontinence     Past Surgical History:  Procedure Laterality Date  . CRYOTHERAPY     For abnormal pap smear, age 66 or 31  . INTRAUTERINE DEVICE INSERTION     PARAGUARD    There were no vitals filed for this visit.  Subjective Assessment - 11/11/18 1500    Subjective  "I do feel some soreness but it is like a 1/10"     Currently in Pain?  Yes    Pain Score  1     Pain Location  Abdomen    Pain Orientation  Left    Pain Descriptors / Indicators  Sore    Pain Type  Chronic pain    Pain Onset  More than a month ago    Pain Frequency  Intermittent    Aggravating Factors   bending/ lifting    Pain Relieving Factors  sitting and resting                       [facility] Adult PT Treatment/Exercise - 11/11/18 0001      Exercises   Exercises  Knee/Hip      Knee/Hip Exercises: Stretches   Hip Flexor Stretch  2 reps;30 seconds      Knee/Hip Exercises: Aerobic   Nustep  L5 x 5 min LE only      Knee/Hip Exercises: Machines for Strengthening   Cybex Knee Flexion  2 x 15 35#    Cybex Leg Press  1 x 15 40#, 1 x 15 60#    Hip Cybex  --      Knee/Hip Exercises: Seated   Sit to Sand  2 sets;10 reps      Manual Therapy   Manual therapy comments  skilled palpation and monitoirng of pt throughout TPDN    Soft tissue mobilization  IASTM along rectus femoris on the L       Trigger Point Dry Needling - 11/11/18 1504    Consent Given?  Yes    Education Handout Provided  No    Muscles Treated Lower Body  Quadriceps   sartorious   Quadriceps Response  Twitch response elicited;Palpable increased muscle length           PT Education - 11/11/18 1546    Education Details  updated HEP for seated pelvic tilt to promote proper sitting posture    Person(s) Educated  Patient    Methods  Explanation;Verbal cues    Comprehension  Verbalized understanding;Verbal cues required       PT Short Term Goals - 10/27/18 1300      PT SHORT TERM GOAL #1   Title  pt to be I with inital HEP     Time  3    Period  Weeks    Status  New    Target Date  11/17/18        PT Long Term Goals - 10/27/18 1300      PT LONG TERM GOAL #1   Title  pt to verbalize and demo proper posture with standing and lifting to reduce and prevent hip/ abdominal and back pain     Time  6    Period  Weeks    Status  New    Target Date  12/08/18      PT LONG TERM GOAL #2   Title  pt to be able to sit/ stand and walk >/= 60 min reporting </= 1/10 pain to promote functional endurance for work     Time  6    Period  Weeks    Status  New    Target Date  12/08/18      PT LONG TERM GOAL #3   Title  pt to return to gardening, hiking and outdoor activities with </= 1/10 pain for pt's personal goal    Time  6    Period  Weeks    Status  New    Target Date  12/08/18      PT LONG TERM GOAL #4   Title  increase FOTO score to </= 32% limited to demo improvement in function     Time  6    Period  Weeks    Status  New    Target Date  12/08/18      PT LONG TERM GOAL #5   Title  pt to be I with all HEP given to maintain and progress current  level of function    Time  6    Period  Weeks    Status  New    Target Date  12/08/18            Plan - 11/11/18 1736    Clinical Impression Statement  improvement of pain with pt reporting 1/10 today. continued TPDN focusing on L rectus femoris and sartorious follwed with IASTM Techniques. continued working on hip and knee strengthening whe she did well with. practiced seated posture and updated HEP for anterior pelvic tilt.    PT Treatment/Interventions  ADLs/Self Care Home Management;Cryotherapy;Electrical Stimulation;Iontophoresis 4mg /ml Dexamethasone;Moist Heat;Ultrasound;Joint Manipulations;Passive range of motion;Patient/family education;Gait training;Stair training;Therapeutic activities;Therapeutic exercise;Manual techniques;Taping;Dry needling;Balance training;Neuromuscular re-education    PT Next Visit Plan  how was DN, update HEP, innomninate anterior rotation on L, STW along rectus femoris/ hip flexors, hamstring activation, core strengthening, posture education, modalities PRN     PT Home Exercise Plan  hip flexor stretching (both standing and supine), hamstring strengthening in sitting, seated pelvic tilt    Consulted and Agree with Plan of Care  Patient       Patient will benefit from skilled therapeutic intervention in order to improve the following deficits and impairments:  Obesity, Pain, Decreased strength, Abnormal gait, Decreased endurance, Decreased balance, Decreased activity tolerance, Increased muscle spasms, Increased fascial restricitons, Postural dysfunction, Improper body mechanics, Decreased range of motion  Visit Diagnosis: Muscle weakness (generalized)  Abnormal posture  Other muscle spasm     Problem List  Patient Active Problem List   Diagnosis Date Noted  . Urinary incontinence 11/06/2018  . Sleep-disordered breathing 10/25/2018  . Obesity (BMI 35.0-39.9 without comorbidity) 10/25/2018  . Situational depression 10/25/2018  . Amenorrhea  10/25/2018  . Family history of ovarian cancer 10/25/2018  . Pure hypercholesterolemia 10/25/2018  . Seasonal allergies 10/25/2018   Starr Lake PT, DPT, LAT, ATC  11/11/18  5:40 PM      [facility] [address] [place], [place], [address] Phone: [phone]   Fax:  [phone]  Name: Savannah George MRN: [id number] Date of Birth: 23-Oct-1981

## 2018-11-13 ENCOUNTER — Ambulatory Visit: Payer: 59 | Admitting: Physical Therapy

## 2018-11-13 ENCOUNTER — Encounter: Payer: Self-pay | Admitting: Physical Therapy

## 2018-11-13 DIAGNOSIS — M62838 Other muscle spasm: Secondary | ICD-10-CM

## 2018-11-13 DIAGNOSIS — M6281 Muscle weakness (generalized): Secondary | ICD-10-CM

## 2018-11-13 DIAGNOSIS — R293 Abnormal posture: Secondary | ICD-10-CM

## 2018-11-13 NOTE — Therapy (Signed)
[place], [place], [address] Phone: [phone]   Fax:  [phone]  Physical Therapy Treatment  Patient Details  Name: Savannah George MRN: [id number] Date of Birth: 10-21-1981 Referring Provider (PT): Salvadore Dom MD   Encounter Date: 11/13/2018  PT End of Session - 11/13/18 1749    Visit Number  4    Number of Visits  13    Date for PT Re-Evaluation  12/08/18    PT Start Time  8413    PT Stop Time  1643    PT Time Calculation (min)  56 min    Activity Tolerance  Patient tolerated treatment well    Behavior During Therapy  [facility] for tasks assessed/performed       Past Medical History:  Diagnosis Date  . Abnormal Pap smear of cervix    age 89 or 89  . Anemia   . Anxiety   . Depression   . DVT (deep venous thrombosis) ([place])   . Migraines   . Urinary incontinence     Past Surgical History:  Procedure Laterality Date  . CRYOTHERAPY     For abnormal pap smear, age 32 or 59  . INTRAUTERINE DEVICE INSERTION     PARAGUARD    There were no vitals filed for this visit.  Subjective Assessment - 11/13/18 1550    Subjective  I have been doing the exrecises.  The groinlower abdominal  pain in improving.  DN  has unlocked quads.  Sort the next day and now 2 days later a lot better..  Walking and standing time improving.     Currently in Pain?  Yes    Pain Score  --   not even a one.    Pain Location  Abdomen    Pain Orientation  Left    Pain Descriptors / Indicators  Burning;Stabbing   pinch   Pain Type  Chronic pain    Pain Frequency  Intermittent    Aggravating Factors   bending lifting    work tasks at [employer]. sitting with poor posture.      Pain Relieving Factors  Exercise,  DN ,  resting.     Multiple Pain Sites  --   sometimes knees are stiff,  neck knots worse witrh stretss                      [facility] Adult PT Treatment/Exercise - 11/13/18 0001      Lumbar  Exercises: Seated   Other Seated Lumbar Exercises  pelvic tilt sitting.  minor cues initially      Lumbar Exercises: Supine   Other Supine Lumbar Exercises  muscle energy gentle   leg press / hamstrings     Knee/Hip Exercises: Stretches   Passive Hamstring Stretch  3 reps;30 seconds    Hip Flexor Stretch  2 reps;20 seconds   gentle     Knee/Hip Exercises: Aerobic   Nustep  L5 x 2 min LE only   irritating so stopped early     Knee/Hip Exercises: Machines for Strengthening   Cybex Leg Press  attempted,  low back  area pain so stopped.        Moist Heat Therapy   Number Minutes Moist Heat  10 Minutes    Moist Heat Location  Hip;Lumbar Spine   table with back support almost sitting     Manual Therapy   Soft tissue mobilization  IASTM along rectus femoris  on the L   also manual , lower abdominals,  tissue softened.             PT Education - 11/13/18 1752    Education Details  exercise form    Person(s) Educated  Patient    Methods  Demonstration;Verbal cues;Explanation    Comprehension  Verbalized understanding;Returned demonstration       PT George Term Goals - 11/13/18 1754      PT George TERM GOAL #1   Title  pt to be I with inital HEP     Baseline  minor cues    Time  3    Period  Weeks    Status  On-going        PT Long Term Goals - 10/27/18 1300      PT LONG TERM GOAL #1   Title  pt to verbalize and demo proper posture with standing and lifting to reduce and prevent hip/ abdominal and back pain     Time  6    Period  Weeks    Status  New    Target Date  12/08/18      PT LONG TERM GOAL #2   Title  pt to be able to sit/ stand and walk >/= 60 min reporting </= 1/10 pain to promote functional endurance for work     Time  6    Period  Weeks    Status  New    Target Date  12/08/18      PT LONG TERM GOAL #3   Title  pt to return to gardening, hiking and outdoor activities with </= 1/10 pain for pt's personal goal    Time  6    Period  Weeks     Status  New    Target Date  12/08/18      PT LONG TERM GOAL #4   Title  increase FOTO score to </= 32% limited to demo improvement in function     Time  6    Period  Weeks    Status  New    Target Date  12/08/18      PT LONG TERM GOAL #5   Title  pt to be I with all HEP given to maintain and progress current level of function    Time  6    Period  Weeks    Status  New    Target Date  12/08/18            Plan - 11/13/18 1750    Clinical Impression Statement  Pain impriving especially after DN last session.  Pain flare while on leg press and nu step may be due to SI issues.  Pain 1-2/10 at end of session prior to moist heat.     PT Next Visit Plan  , update HEP, innomninate anterior rotation on L, STW along rectus femoris/ hip flexors, hamstring activation, core strengthening, posture education, modalities PRN     PT Home Exercise Plan  hip flexor stretching (both standing and supine), hamstring strengthening in sitting, seated pelvic tilt,  muscle energy    Consulted and Agree with Plan of Care  Patient       Patient will benefit from skilled therapeutic intervention in order to improve the following deficits and impairments:     Visit Diagnosis: Muscle weakness (generalized)  Abnormal posture  Other muscle spasm     Problem List Patient Active Problem List   Diagnosis Date Noted  .  Urinary incontinence 11/06/2018  . Sleep-disordered breathing 10/25/2018  . Obesity (BMI 35.0-39.9 without comorbidity) 10/25/2018  . Situational depression 10/25/2018  . Amenorrhea 10/25/2018  . Family history of ovarian cancer 10/25/2018  . Pure hypercholesterolemia 10/25/2018  . Seasonal allergies 10/25/2018    , PTA 11/13/2018, 5:55 PM  [facility] [address] [place], [place], [address] Phone: [phone]   Fax:  [phone]  Name: Savannah George MRN: [id number] Date of Birth: 1981/02/04

## 2018-11-18 ENCOUNTER — Ambulatory Visit: Payer: 59 | Admitting: Physical Therapy

## 2018-11-19 ENCOUNTER — Ambulatory Visit: Payer: 59 | Admitting: Physical Therapy

## 2018-11-24 ENCOUNTER — Ambulatory Visit: Payer: 59 | Admitting: Physical Therapy

## 2018-11-25 ENCOUNTER — Encounter: Payer: Self-pay | Admitting: Neurology

## 2018-11-25 ENCOUNTER — Ambulatory Visit: Payer: 59 | Admitting: Neurology

## 2018-11-25 VITALS — BP 147/93 | HR 108 | Ht 61.0 in | Wt 202.0 lb

## 2018-11-25 DIAGNOSIS — E669 Obesity, unspecified: Secondary | ICD-10-CM

## 2018-11-25 DIAGNOSIS — R0683 Snoring: Secondary | ICD-10-CM

## 2018-11-25 DIAGNOSIS — R51 Headache: Secondary | ICD-10-CM

## 2018-11-25 DIAGNOSIS — Z820 Family history of epilepsy and other diseases of the nervous system: Secondary | ICD-10-CM

## 2018-11-25 DIAGNOSIS — R519 Headache, unspecified: Secondary | ICD-10-CM

## 2018-11-25 DIAGNOSIS — R419 Unspecified symptoms and signs involving cognitive functions and awareness: Secondary | ICD-10-CM

## 2018-11-25 DIAGNOSIS — G4719 Other hypersomnia: Secondary | ICD-10-CM | POA: Diagnosis not present

## 2018-11-25 NOTE — Progress Notes (Signed)
Subjective:    Patient ID: Savannah George is a 37 y.o. female.  HPI     Star Age, MD, PhD [facility] [address], [address] [place], [place] [address]   Dear Dr. Juleen China,    I saw your patient, Savannah George, upon your kind request in the sleep clinic today for initial consultation of her sleep disorder, in particular, concern for underlying obstructive sleep apnea. The patient is unaccompanied today. As you know, Savannah George with an underlying medical history of migraines, DVT, depression, anxiety, anemia, and obesity, who reports worsening daytime tiredness and sleepiness over the past couple of years, as well as intermittent snoring.she does not wake up rested. She has had trouble maintaining sleep but typically no difficulty falling asleep. I reviewed your office note from 10/24/2018. Her Epworth sleepiness score is 11 out of 24 today, fatigue score is 46 out of 63. She is single/divorced and lives with a roommate, she works at the [employer]. She has no children. She is a nonsmoker and drinks alcohol occasionally, about 2 times a week, caffeine in the form of coffee and soda, up to 2-3 servings per day, but not necessarily daily. Her father has sleep apnea and has a CPAP machine. Her ex-husband has sleep apnea and she is familiar with the diagnosis. She would be willing to consider CPAP therapy if the need arises. She has been struggling with her weight. She has not been as active this past year. She moved from [place] about 15 months ago. She endorses increase in stress, lost her mother at age 61 about 2 years ago and also went through a divorce recently. She denies telltale symptoms versus leg syndrome. She is not aware of any leg twitching while asleep. She does tend to be a restless sleeper at times. She has woken up with a headache, up to 2 times per week, denies night to night nocturia. Her bedtime is  generally between 9:30 and 10:30 PM, rise time around 6. She does not watch TV in her bedroom, she has 3 cats in her bedroom. When she has difficulty maintaining sleep and frequent nighttime awakenings, she tends to have more physical exhaustion, foggy headedness and difficulty with concentration.  Her Past Medical History Is Significant For: Past Medical History:  Diagnosis Date  . Abnormal Pap smear of cervix    age 38 or 26  . Anemia   . Anxiety   . Depression   . DVT (deep venous thrombosis) ([place])   . Migraines   . Urinary incontinence     Her Past Surgical History Is Significant For: Past Surgical History:  Procedure Laterality Date  . CRYOTHERAPY     For abnormal pap smear, age 62 or 70  . INTRAUTERINE DEVICE INSERTION     PARAGUARD    Her Family History Is Significant For: Family History  Problem Relation Age of Onset  . Kidney cancer Mother   . Renal cancer Mother   . Diabetes Father   . Heart disease Father   . Stroke Father   . Uterine cancer Sister   . Diabetes Paternal Aunt   . Heart disease Maternal Grandfather   . Heart attack Maternal Grandfather   . Diabetes Paternal Grandmother   . Diabetes Paternal Grandfather   . Stroke Paternal Grandfather   . Heart attack Paternal Grandfather   . Ovarian cancer Sister     Her Social History Is Significant For: Social  History   Socioeconomic History  . Marital status: Divorced    Spouse name: Not on file  . Number of children: Not on file  . Years of education: Not on file  . Highest education level: Master's degree (e.g., MA, MS, MEng, MEd, MSW, MBA)  Occupational History  . Occupation: Tree surgeon: [employer]  Social Needs  . Financial resource strain: Not on file  . Food insecurity:    Worry: Not on file    Inability: Not on file  . Transportation needs:    Medical: Not on file    Non-medical: Not on file  Tobacco Use  . Smoking status: Never Smoker  . Smokeless tobacco:  Never Used  Substance and Sexual Activity  . Alcohol use: Yes    Alcohol/week: 0.0 - 3.0 standard drinks  . Drug use: Not Currently  . Sexual activity: Yes    Partners: Male, Female    Birth control/protection: None  Lifestyle  . Physical activity:    Days per week: Not on file    Minutes per session: Not on file  . Stress: Not on file  Relationships  . Social connections:    Talks on phone: Not on file    Gets together: Not on file    Attends religious service: Not on file    Active member of club or organization: Not on file    Attends meetings of clubs or organizations: Not on file    Relationship status: Not on file  Other Topics Concern  . Not on file  Social History Narrative   Recently moved back to [place] after divorcing husband. Went to college here and still has friends here. Enjoys hiking. Trying to integrate herself back into the community.     Her Allergies Are:  Allergies  Allergen Reactions  . Aspirin Hives  . Latex Hives  :   Her Current Medications Are:  Outpatient Encounter Medications as of 11/25/2018  Medication Sig  . Cholecalciferol (VITAMIN D PO) Take 2,000 Int'l Units by mouth.  . loratadine (CLARITIN) 10 MG tablet Take 10 mg by mouth daily.  . Multiple Vitamins-Minerals (MULTIVITAMIN PO) Take by mouth.  . [DISCONTINUED] medroxyPROGESTERone (PROVERA) 10 MG tablet Take 1 tablet (10 mg total) by mouth daily.  . [DISCONTINUED] naproxen sodium (ANAPROX DS) 550 MG tablet Take one tablet a day BID for one week, then q 12 hours prn.   No facility-administered encounter medications on file as of 11/25/2018.   :  Review of Systems:  Out of a complete 14 point review of systems, all are reviewed and negative with the exception of these symptoms as listed below: Review of Systems  Neurological:       Pt presents today to discuss her sleep. Pt has never had a sleep study and does not snore. Pt is complaining of fatigue.  Epworth Sleepiness Scale 0= would  never doze 1= slight chance of dozing 2= moderate chance of dozing 3= high chance of dozing  Sitting and reading: 2 Watching TV: 2 Sitting inactive in a public place (ex. Theater or meeting): 1 As a passenger in a car for an hour without a break: 1 Lying down to rest in the afternoon: 2 Sitting and talking to someone: 1 Sitting quietly after lunch (no alcohol): 2 In a car, while stopped in traffic: 0 Total: 11     Objective:  Neurological Exam  Physical Exam Physical Examination:   Vitals:   11/25/18 1527  BP: (!) 147/93  Pulse: (!) 108    General Examination: The patient is a very pleasant 37 y.o. female in no acute distress. She appears well-developed and well-nourished and well groomed. Mildly anxious appearing.   HEENT: Normocephalic, atraumatic, pupils are equal, round and reactive to light and accommodation. Corrective eye glasses in place. Extraocular tracking is good without limitation to gaze excursion or nystagmus noted. Normal smooth pursuit is noted. Hearing is grossly intact. Face is symmetric with normal facial animation and normal facial sensation. Speech is clear with no dysarthria noted. There is no hypophonia. There is no lip, neck/head, jaw or voice tremor. Neck is supple with full range of passive and active motion. There are no carotid bruits on auscultation. Oropharynx exam reveals: mild mouth dryness, adequate dental hygiene and mild airway crowding, due to smaller airway entry and tonsils of 1+, prominent appearing uvula. Mallampati is class II. Tongue protrudes centrally and palate elevates symmetrically. Neck size is 15.75 inches. She has a minimal overbite.    Chest: Clear to auscultation without wheezing, rhonchi or crackles noted.  Heart: S1+S2+0, regular and normal without murmurs, rubs or gallops noted.   Abdomen: Soft, non-tender and non-distended with normal bowel sounds appreciated on auscultation.  Extremities: There is no pitting edema in the  distal lower extremities bilaterally.   Skin: Warm and dry without trophic changes noted.  Musculoskeletal: exam reveals no obvious joint deformities, tenderness or joint swelling or erythema.   Neurologically:  Mental status: The patient is awake, alert and oriented in all 4 spheres. Her immediate and remote memory, attention, language skills and fund of knowledge are appropriate. There is no evidence of aphasia, agnosia, apraxia or anomia. Speech is clear with normal prosody and enunciation. Thought process is linear. Mood is normal and affect is normal.  Cranial nerves II - XII are as described above under HEENT exam. In addition: shoulder shrug is normal with equal shoulder height noted. Motor exam: Normal bulk, strength and tone is noted. There is no drift, tremor or rebound. Romberg is negative. Reflexes are 3+. Fine motor skills and coordination: grossly intact.  Cerebellar testing: No dysmetria or intention tremor. There is no truncal or gait ataxia.  Sensory exam: intact to light touch.  Gait, station and balance: She stands easily. No veering to one side is noted. No leaning to one side is noted. Posture is age-appropriate and stance is narrow based. Gait shows normal stride length and normal pace. No problems turning are noted. Tandem walk is unremarkable.       Assessment and plan:  In summary, Mahika Vanvoorhis is a very pleasant 36 y.o.-year old female with an underlying medical history of migraines, DVT, depression, anxiety, anemia, and obesity, whose history and physical exam are concerning for obstructive sleep apnea (OSA). I had a long chat with the patient about my findings and the diagnosis of OSA, its prognosis and treatment options. We talked about medical treatments, surgical interventions and non-pharmacological approaches. I explained in particular the risks and ramifications of untreated moderate to severe OSA, especially with respect to developing cardiovascular disease down the  Road, including congestive heart failure, difficult to treat hypertension, cardiac arrhythmias, or stroke. Even type 2 diabetes has, in part, been linked to untreated OSA. Symptoms of untreated OSA include daytime sleepiness, memory problems, mood irritability and mood disorder such as depression and anxiety, lack of energy, as well as recurrent headaches, especially morning headaches. We talked about trying to maintain a healthy lifestyle in general, as  well as the importance of weight control. I encouraged the patient to eat healthy, exercise daily and keep well hydrated, to keep a scheduled bedtime and wake time routine, to not skip any meals and eat healthy snacks in between meals. I advised the patient not to drive when feeling sleepy. I recommended the following at this time: sleep study with potential positive airway pressure titration. (We will score hypopneas at 4%).   I explained the sleep test procedure to the patient and also outlined possible surgical and non-surgical treatment options of OSA, including the use of a custom-made dental device (which would require a referral to a specialist dentist or oral surgeon), upper airway surgical options, such as pillar implants, radiofrequency surgery, tongue base surgery, and UPPP (which would involve a referral to an ENT surgeon). Rarely, jaw surgery such as mandibular advancement may be considered.  I also explained the CPAP treatment option to the patient, who indicated that she would be willing to try CPAP if the need arises. I explained the importance of being compliant with PAP treatment, not only for insurance purposes but primarily to improve Her symptoms, and for the patient's long term health benefit, including to reduce Her cardiovascular risks. I answered all her questions today and the patient was in agreement. I plan to see her back after the sleep study is completed and encouraged her to call with any interim questions, concerns, problems or  updates.   Thank you very much for allowing me to participate in the care of this nice patient. If I can be of any further assistance to you please do not hesitate to call me at [phone].  Sincerely,   Star Age, MD, PhD

## 2018-11-25 NOTE — Patient Instructions (Signed)

## 2018-11-27 ENCOUNTER — Ambulatory Visit: Payer: 59 | Attending: Obstetrics and Gynecology | Admitting: Physical Therapy

## 2018-11-27 ENCOUNTER — Encounter: Payer: Self-pay | Admitting: Physical Therapy

## 2018-11-27 DIAGNOSIS — M62838 Other muscle spasm: Secondary | ICD-10-CM | POA: Insufficient documentation

## 2018-11-27 DIAGNOSIS — R293 Abnormal posture: Secondary | ICD-10-CM | POA: Insufficient documentation

## 2018-11-27 DIAGNOSIS — M6281 Muscle weakness (generalized): Secondary | ICD-10-CM | POA: Diagnosis not present

## 2018-11-27 NOTE — Therapy (Signed)
[place], [place], [address] Phone: [phone]   Fax:  [phone]  Physical Therapy Treatment  Patient Details  Name: Keyanna Sandefer MRN: [id number] Date of Birth: 05/16/1981 Referring Provider (PT): Salvadore Dom MD   Encounter Date: 11/27/2018  PT End of Session - 11/27/18 1508    Visit Number  5    Number of Visits  13    Date for PT Re-Evaluation  12/08/18    PT Start Time  1501    PT Stop Time  1550    PT Time Calculation (min)  49 min    Activity Tolerance  Patient tolerated treatment well       Past Medical History:  Diagnosis Date  . Abnormal Pap smear of cervix    age 33 or 33  . Anemia   . Anxiety   . Depression   . DVT (deep venous thrombosis) ([place])   . Migraines   . Urinary incontinence     Past Surgical History:  Procedure Laterality Date  . CRYOTHERAPY     For abnormal pap smear, age 70 or 89  . INTRAUTERINE DEVICE INSERTION     PARAGUARD    There were no vitals filed for this visit.  Subjective Assessment - 11/27/18 1505    Subjective  " I did have increased pain in the, which she reports has stayed the same"    Currently in Pain?  Yes    Pain Score  1     Pain Location  Back    Pain Orientation  Left    Pain Descriptors / Indicators  Aching;Sore    Pain Type  Chronic pain    Pain Onset  More than a month ago    Pain Frequency  Intermittent    Aggravating Factors   bending and lifting doing too much                       [facility] Adult PT Treatment/Exercise - 11/27/18 0001      Exercises   Exercises  Lumbar;Knee/Hip      Lumbar Exercises: Quadruped   Madcat/Old Horse  10 reps   holding 2 seconds     Knee/Hip Exercises: Stretches   Active Hamstring Stretch  4 reps;30 seconds   contract/ relax with 10 seconds     Knee/Hip Exercises: Aerobic   Nustep  L5 x 5 min UE/ LE      Knee/Hip Exercises: Supine   Hip Adduction Isometric  2 sets;10  reps;Strengthening;Both   holding 3 seconds ea.   Straight Leg Raises  2 sets;10 reps;Left      Moist Heat Therapy   Number Minutes Moist Heat  10 Minutes    Moist Heat Location  Hip;Lumbar Spine      Manual Therapy   Manual Therapy  Soft tissue mobilization    Joint Mobilization  LAD grade 5 for LLE    Muscle Energy Technique  L hip flexor with manual resitance 1 x 10 holding 2 seconds               PT Short Term Goals - 11/13/18 1754      PT SHORT TERM GOAL #1   Title  pt to be I with inital HEP     Baseline  minor cues    Time  3    Period  Weeks    Status  On-going  PT Long Term Goals - 10/27/18 1300      PT LONG TERM GOAL #1   Title  pt to verbalize and demo proper posture with standing and lifting to reduce and prevent hip/ abdominal and back pain     Time  6    Period  Weeks    Status  New    Target Date  12/08/18      PT LONG TERM GOAL #2   Title  pt to be able to sit/ stand and walk >/= 60 min reporting </= 1/10 pain to promote functional endurance for work     Time  6    Period  Weeks    Status  New    Target Date  12/08/18      PT LONG TERM GOAL #3   Title  pt to return to gardening, hiking and outdoor activities with </= 1/10 pain for pt's personal goal    Time  6    Period  Weeks    Status  New    Target Date  12/08/18      PT LONG TERM GOAL #4   Title  increase FOTO score to </= 32% limited to demo improvement in function     Time  6    Period  Weeks    Status  New    Target Date  12/08/18      PT LONG TERM GOAL #5   Title  pt to be I with all HEP given to maintain and progress current level of function    Time  6    Period  Weeks    Status  New    Target Date  12/08/18            Plan - 11/27/18 1536    Clinical Impression Statement  pt reported increased pain in the L SIJ which she reported likely is from doing additional exercises at home. focused on hamstring stretching and hip flexor MET techniques. continued  working core strengthening which she reported improvement of low back. pt did report frequent muscle cramping throughout session which relieved quickly with stretching.  Ended session with [facility] to calm down soreness.    PT Treatment/Interventions  ADLs/Self Care Home Management;Cryotherapy;Electrical Stimulation;Iontophoresis 83m/ml Dexamethasone;Moist Heat;Ultrasound;Joint Manipulations;Passive range of motion;Patient/family education;Gait training;Stair training;Therapeutic activities;Therapeutic exercise;Manual techniques;Taping;Dry needling;Balance training;Neuromuscular re-education    PT Next Visit Plan  , update HEP, innomninate anterior rotation on L, STW along rectus femoris/ hip flexors, hamstring activation, core strengthening, posture education, modalities PRN     PT Home Exercise Plan  hip flexor stretching (both standing and supine), hamstring strengthening in sitting, seated pelvic tilt,  muscle energy    Consulted and Agree with Plan of Care  Patient       Patient will benefit from skilled therapeutic intervention in order to improve the following deficits and impairments:  Obesity, Pain, Decreased strength, Abnormal gait, Decreased endurance, Decreased balance, Decreased activity tolerance, Increased muscle spasms, Increased fascial restricitons, Postural dysfunction, Improper body mechanics, Decreased range of motion  Visit Diagnosis: Muscle weakness (generalized)  Abnormal posture  Other muscle spasm     Problem List Patient Active Problem List   Diagnosis Date Noted  . Urinary incontinence 11/06/2018  . Sleep-disordered breathing 10/25/2018  . Obesity (BMI 35.0-39.9 without comorbidity) 10/25/2018  . Situational depression 10/25/2018  . Amenorrhea 10/25/2018  . Family history of ovarian cancer 10/25/2018  . Pure hypercholesterolemia 10/25/2018  . Seasonal allergies 10/25/2018   KStarr LakePT,  DPT, LAT, ATC  11/27/18  3:46 PM      [facility] [address] [place], [place], [address] Phone: [phone]   Fax:  [phone]  Name: Oksana Deberry MRN: [id number] Date of Birth: [birth year]

## 2018-12-04 ENCOUNTER — Encounter: Payer: 59 | Admitting: Physical Therapy

## 2018-12-09 ENCOUNTER — Telehealth: Payer: Self-pay | Admitting: Certified Nurse Midwife

## 2018-12-09 DIAGNOSIS — Z30430 Encounter for insertion of intrauterine contraceptive device: Secondary | ICD-10-CM

## 2018-12-09 NOTE — Telephone Encounter (Signed)
Spoke with patient. Took provera 10mg  x 10 days 1st or 2nd wk of October, had some spotting, no full cycle. No bleeding since. Has experienced symptoms for the last 10 days she usually sees before menses starts,  increase in acne, breast tenderness and normal vaginal d/c, but no bleeding. Is SA, "mostly condoms" for contraception. Has not taken UPT. Is interested in Mirena IUD. Denies pelvic pain, vaginal odor, urinary symptoms, N/V, fever/chills. Recommended UPT to r/o pregnancy.   Advised will review with Dr. Talbert Nan and our office will return call with recommendations.   Dr. Talbert Nan -please advise.

## 2018-12-09 NOTE — Telephone Encounter (Signed)
I agree with UPT to r/o pregnancy. She needs to have 2 weeks of protected intercourse or no intercourse, have a negative UPT in the office prior to IUD insertion.

## 2018-12-09 NOTE — Telephone Encounter (Signed)
Patient says she is having abnormal cycles.

## 2018-12-10 NOTE — Telephone Encounter (Signed)
Patient is returning a call to Jill. °

## 2018-12-10 NOTE — Telephone Encounter (Signed)
Spoke with patient, advised as seen below per Dr. Talbert Nan. Patient request to proceed with IUD insertion. IUD insertion scheduled for 12/25/18 at 10am with Dr. Talbert Nan. Patient aware to return call to office if UPT positive or any chance of pregnancy prior to insertion. Order placed for precert. Advised to take Motrin 800 mg with food and water one hour before procedure.   Routing to provider for final review. Patient is agreeable to disposition. Will close encounter.  Cc: Lerry Liner, Magdalene Patricia

## 2018-12-10 NOTE — Telephone Encounter (Signed)
Left message to call Rheanna Sergent, RN at [facility] [phone].   

## 2018-12-11 ENCOUNTER — Ambulatory Visit: Payer: 59 | Admitting: Physical Therapy

## 2018-12-19 ENCOUNTER — Telehealth: Payer: Self-pay | Admitting: Obstetrics and Gynecology

## 2018-12-19 NOTE — Telephone Encounter (Signed)
Patient returning Rosa's call for benefits.

## 2018-12-19 NOTE — Telephone Encounter (Signed)
Call placed to convey benefits for iud. °

## 2018-12-22 NOTE — Progress Notes (Signed)
GYNECOLOGY  VISIT   HPI: 37 y.o.   Divorced White or Caucasian Not Hispanic or Latino  female   [id number] with No LMP recorded (exact date). (Menstrual status: Irregular Periods).   here for Mirena IUD insertion.    She partially expulsed her paragard IUD in 10/19. It was noted at the time of an ultrasound for pain and removed. She was also having amenorrhea at the time, normal labs, including: a normal estrogen level, normal testosterone panel (total testosterone of 52.8), normal prolactin, FSH of 5.9. Treated with provera, only had spotting. That was after 6+ months of not having a cycle. At the time of her ultrasound in 10/19 the endometrial stripe was 6.36 mm.  Her prior pain is being managed by PT for hip flexor strain. She is using condoms for contraception every time. She is sexually active with men and women, more than one partner. States they are all very careful with regular std testing and using protection including dental dams, condoms, finger cots.  GYNECOLOGIC HISTORY: No LMP recorded (exact date). (Menstrual status: Irregular Periods). Contraception:None Menopausal hormone therapy: None        OB History    Gravida  0   Para  0   Term  0   Preterm  0   AB  0   Living  0     SAB  0   TAB  0   Ectopic  0   Multiple  0   Live Births  0              Patient Active Problem List   Diagnosis Date Noted  . Urinary incontinence 11/06/2018  . Sleep-disordered breathing 10/25/2018  . Obesity (BMI 35.0-39.9 without comorbidity) 10/25/2018  . Situational depression 10/25/2018  . Amenorrhea 10/25/2018  . Family history of ovarian cancer 10/25/2018  . Pure hypercholesterolemia 10/25/2018  . Seasonal allergies 10/25/2018    Past Medical History:  Diagnosis Date  . Abnormal Pap smear of cervix    age 2 or 65  . Anemia   . Anxiety   . Depression   . DVT (deep venous thrombosis) ([place])   . Migraines   . Urinary incontinence     Past Surgical History:   Procedure Laterality Date  . CRYOTHERAPY     For abnormal pap smear, age 35 or 57  . INTRAUTERINE DEVICE INSERTION     PARAGUARD    Current Outpatient Medications  Medication Sig Dispense Refill  . Cholecalciferol (VITAMIN D PO) Take 2,000 Int'l Units by mouth.    . loratadine (CLARITIN) 10 MG tablet Take 10 mg by mouth daily.    . Multiple Vitamins-Minerals (MULTIVITAMIN PO) Take by mouth.     No current facility-administered medications for this visit.      ALLERGIES: Aspirin and Latex  Family History  Problem Relation Age of Onset  . Kidney cancer Mother   . Renal cancer Mother   . Diabetes Father   . Heart disease Father   . Stroke Father   . Uterine cancer Sister   . Diabetes Paternal Aunt   . Heart disease Maternal Grandfather   . Heart attack Maternal Grandfather   . Diabetes Paternal Grandmother   . Diabetes Paternal Grandfather   . Stroke Paternal Grandfather   . Heart attack Paternal Grandfather   . Ovarian cancer Sister     Social History   Socioeconomic History  . Marital status: Divorced    Spouse name: Not on file  .  Number of children: Not on file  . Years of education: Not on file  . Highest education level: Master's degree (e.g., MA, MS, MEng, MEd, MSW, MBA)  Occupational History  . Occupation: Tree surgeon: [employer]  Social Needs  . Financial resource strain: Not on file  . Food insecurity:    Worry: Not on file    Inability: Not on file  . Transportation needs:    Medical: Not on file    Non-medical: Not on file  Tobacco Use  . Smoking status: Never Smoker  . Smokeless tobacco: Never Used  Substance and Sexual Activity  . Alcohol use: Yes    Alcohol/week: 0.0 - 3.0 standard drinks  . Drug use: Not Currently  . Sexual activity: Yes    Partners: Male, Female    Birth control/protection: None  Lifestyle  . Physical activity:    Days per week: Not on file    Minutes per session: Not on file  . Stress: Not  on file  Relationships  . Social connections:    Talks on phone: Not on file    Gets together: Not on file    Attends religious service: Not on file    Active member of club or organization: Not on file    Attends meetings of clubs or organizations: Not on file    Relationship status: Not on file  . Intimate partner violence:    Fear of current or ex partner: Not on file    Emotionally abused: Not on file    Physically abused: Not on file    Forced sexual activity: Not on file  Other Topics Concern  . Not on file  Social History Narrative   Recently moved back to [place] after divorcing husband. Went to college here and still has friends here. Enjoys hiking. Trying to integrate herself back into the community.     Review of Systems  Constitutional: Negative.   HENT: Negative.   Eyes: Negative.   Respiratory: Negative.   Cardiovascular: Negative.   Gastrointestinal: Negative.   Genitourinary:       Irregular periods  Musculoskeletal: Negative.   Skin: Negative.   Neurological: Negative.   Endo/Heme/Allergies: Negative.   Psychiatric/Behavioral: Negative.     PHYSICAL EXAMINATION:    BP 130/78 (BP Location: Right Arm, Patient Position: Sitting, Cuff Size: Normal)   Pulse 80   Wt 173 lb 9.6 oz (78.7 kg)   LMP  (Exact Date) Comment: LMP was in May 2019  BMI 32.80 kg/m     General appearance: alert, cooperative and appears stated age Pelvic: External genitalia:  no lesions              Urethra:  normal appearing urethra with no masses, tenderness or lesions              Bartholins and Skenes: normal                 Vagina: normal appearing vagina with normal color and discharge, no lesions              Cervix:  Ectropion, no lesions  The risks of the mirena IUD were reviewed with the patient, including infection, abnormal bleeding and uterine perfortion. Consent was signed.  A speculum was placed in the vagina, the cervix was cleansed with betadine. A tenaculum was placed  on the cervix, the uterus sounded to 8cm. The cervix was dilated to a 5 hagar  The mirena IUD  was inserted without difficulty. The string were cut to 3-4 cm. The tenaculum was removed. Slight oozing from the tenaculum site was stopped with pressure.   The patient tolerated the procedure well.    Chaperone was present for exam.  ASSESSMENT Contraception Amenorrhea, normal labs, spotting after provera Screening STD    PLAN Mirena IUD insertion Genprobe sent She is very aware of the risks with STD's and is very careful, using protection every time. She knows an STD could lead to a worse infection with an IUD in place Discussed more frequent STD testing if she is having multiple partners (could do 3-6 months) Nuswab sent, last blood work was in 10/19 (declines recheck today)    An After Visit Summary was printed and given to the patient.  In addition to the IUD insertion, ~10 minutes was spent in counseling.   CC: Savannah George, CNM

## 2018-12-25 ENCOUNTER — Encounter: Payer: Self-pay | Admitting: Obstetrics and Gynecology

## 2018-12-25 ENCOUNTER — Other Ambulatory Visit: Payer: Self-pay

## 2018-12-25 ENCOUNTER — Ambulatory Visit (INDEPENDENT_AMBULATORY_CARE_PROVIDER_SITE_OTHER): Payer: 59 | Admitting: Obstetrics and Gynecology

## 2018-12-25 VITALS — BP 130/78 | HR 80 | Wt 173.6 lb

## 2018-12-25 DIAGNOSIS — Z01812 Encounter for preprocedural laboratory examination: Secondary | ICD-10-CM

## 2018-12-25 DIAGNOSIS — Z113 Encounter for screening for infections with a predominantly sexual mode of transmission: Secondary | ICD-10-CM | POA: Diagnosis not present

## 2018-12-25 DIAGNOSIS — Z30430 Encounter for insertion of intrauterine contraceptive device: Secondary | ICD-10-CM | POA: Diagnosis not present

## 2018-12-25 LAB — POCT URINE PREGNANCY: Preg Test, Ur: NEGATIVE

## 2018-12-27 LAB — CHLAMYDIA/GONOCOCCUS/TRICHOMONAS, NAA
CHLAMYDIA BY NAA: NEGATIVE
Gonococcus by NAA: NEGATIVE
Trich vag by NAA: NEGATIVE

## 2018-12-29 ENCOUNTER — Encounter: Payer: Self-pay | Admitting: Physical Therapy

## 2018-12-29 ENCOUNTER — Ambulatory Visit: Payer: 59 | Attending: Obstetrics and Gynecology | Admitting: Physical Therapy

## 2018-12-29 DIAGNOSIS — R293 Abnormal posture: Secondary | ICD-10-CM

## 2018-12-29 DIAGNOSIS — M62838 Other muscle spasm: Secondary | ICD-10-CM | POA: Diagnosis not present

## 2018-12-29 DIAGNOSIS — M6281 Muscle weakness (generalized): Secondary | ICD-10-CM | POA: Diagnosis not present

## 2018-12-29 NOTE — Therapy (Signed)
[place], [place], [address] Phone: [phone]   Fax:  [phone]  Physical Therapy Treatment / Discharge summary  Patient Details  Name: Savannah George MRN: [id number] Date of Birth: 19-Mar-1981 Referring Provider (PT): Salvadore Dom MD   Encounter Date: 12/29/2018  PT End of Session - 12/29/18 1541    Visit Number  6    Number of Visits  13    Date for PT Re-Evaluation  12/29/18    PT Start Time  1504    PT Stop Time  1542    PT Time Calculation (min)  38 min    Activity Tolerance  Patient tolerated treatment well    Behavior During Therapy  [employer] for tasks assessed/performed       Past Medical History:  Diagnosis Date  . Abnormal Pap smear of cervix    age 38 or 26  . Anemia   . Anxiety   . Depression   . DVT (deep venous thrombosis) ([place])   . Migraines   . Urinary incontinence     Past Surgical History:  Procedure Laterality Date  . CRYOTHERAPY     For abnormal pap smear, age 62 or 54  . INTRAUTERINE DEVICE INSERTION     PARAGUARD    There were no vitals filed for this visit.  Subjective Assessment - 12/29/18 1506    Subjective  "I am still having some soreness, I have been having increased spasms in the legs which the MD put me on a medciation for magnesium"    Currently in Pain?  No/denies    Pain Score  0-No pain    Pain Orientation  Left    Pain Descriptors / Indicators  Sore;Aching    Pain Type  Chronic pain    Pain Onset  More than a month ago    Pain Frequency  Intermittent    Aggravating Factors   when sitting in the chair at home    Pain Relieving Factors  exercising,          [facility] PT Assessment - 12/29/18 1514      Observation/Other Assessments   Focus on Therapeutic Outcomes (FOTO)   12% limited      AROM   Lumbar Flexion  62    Lumbar Extension  38    Lumbar - Right Side Bend  30   reproduced condordant pain   Lumbar - Left Side Bend  30       Strength   Strength Assessment Site  Hip    Right/Left Hip  Right;Left    Right Hip Flexion  5/5    Right Hip ABduction  4+/5    Right Hip ADduction  4+/5    Left Hip Flexion  5/5    Left Hip ABduction  4+/5   reproduced condordant pain    Left Hip ADduction  4+/5                   [facility] Adult PT Treatment/Exercise - 12/29/18 0001      Self-Care   Self-Care  Other Self-Care Comments    Other Self-Care Comments   taking appropriate breaks after sitting for long periods of time and to avoid criss cross      Knee/Hip Exercises: Stretches   Active Hamstring Stretch  1 rep;30 seconds;Left    Hip Flexor Stretch  2 reps;30 seconds    Other Knee/Hip Stretches  TFL on the L 2 x 30  sec hold reaching LUE overhead      Manual Therapy   Manual therapy comments  MTPR along the TFL followed w    Soft tissue mobilization  DTM along the TFL             PT Education - 12/29/18 1550    Education Details  reviewed previously provided HEP and how to progress at home. posture with sitting and reviewed benefits of taking frequent breaks from sitting for long periods of time"     Person(s) Educated  Patient    Methods  Explanation;Verbal cues    Comprehension  Verbalized understanding;Verbal cues required       PT Short Term Goals - 11/13/18 1754      PT SHORT TERM GOAL #1   Title  pt to be I with inital HEP     Baseline  minor cues    Time  3    Period  Weeks    Status  On-going        PT Long Term Goals - 12/29/18 1630      PT LONG TERM GOAL #1   Title  pt to verbalize and demo proper posture with standing and lifting to reduce and prevent hip/ abdominal and back pain     Time  6    Period  Weeks    Status  Achieved      PT LONG TERM GOAL #2   Title  pt to be able to sit/ stand and walk >/= 60 min reporting </= 1/10 pain to promote functional endurance for work     Period  Weeks    Status  Achieved      PT LONG TERM GOAL #3   Title  pt to return to  gardening, hiking and outdoor activities with </= 1/10 pain for pt's personal goal    Period  Weeks    Status  Achieved      PT LONG TERM GOAL #4   Title  increase FOTO score to </= 32% limited to demo improvement in function     Time  6    Period  Weeks    Status  Achieved      PT LONG TERM GOAL #5   Title  pt to be I with all HEP given to maintain and progress current level of function    Time  6    Period  Weeks    Status  Achieved            Plan - 12/29/18 1539    Clinical Impression Statement  pt reports decreased tightness in the hip with intermittent tightness/ soreness but only after she has been sitting ina specific chair sitting criss-crossed. utilized MTPR and stretch which she reported significant relief of tightness. she has improved trunk mobility and hip/ knee strength. she met all goals and is able to maintain and progress current level of function and and will be dicharged from PT today.     PT Treatment/Interventions  ADLs/Self Care Home Management;Cryotherapy;Electrical Stimulation;Iontophoresis 31m/ml Dexamethasone;Moist Heat;Ultrasound;Joint Manipulations;Passive range of motion;Patient/family education;Gait training;Stair training;Therapeutic activities;Therapeutic exercise;Manual techniques;Taping;Dry needling;Balance training;Neuromuscular re-education    PT Next Visit Plan  d/c    PT Home Exercise Plan  hip flexor stretching (both standing and supine), hamstring strengthening in sitting, seated pelvic tilt,  muscle energy    Consulted and Agree with Plan of Care  Patient       Patient will benefit from skilled therapeutic intervention in order to  improve the following deficits and impairments:  Obesity, Pain, Decreased strength, Abnormal gait, Decreased endurance, Decreased balance, Decreased activity tolerance, Increased muscle spasms, Increased fascial restricitons, Postural dysfunction, Improper body mechanics, Decreased range of motion  Visit  Diagnosis: Muscle weakness (generalized)  Abnormal posture  Other muscle spasm     Problem List Patient Active Problem List   Diagnosis Date Noted  . Urinary incontinence 11/06/2018  . Sleep-disordered breathing 10/25/2018  . Obesity (BMI 35.0-39.9 without comorbidity) 10/25/2018  . Situational depression 10/25/2018  . Amenorrhea 10/25/2018  . Family history of ovarian cancer 10/25/2018  . Pure hypercholesterolemia 10/25/2018  . Seasonal allergies 10/25/2018    [place] 12/29/2018, 4:30 PM  [facility] [address] [place], [place], [address] Phone: [phone]   Fax:  [phone]  Name: Tanishka Drolet MRN: [id number] Date of Birth: Jun 17, 1981      PHYSICAL THERAPY DISCHARGE SUMMARY  Visits from Start of Care: 6  Current functional level related to goals / functional outcomes: See goals   Remaining deficits: Intermittent soreness along the TFL with prolonged criss-cross sitting, relieved with stretching and exercsie.    Education / Equipment: HEP, theraband, posture, lifting mechanics  Plan: Patient agrees to discharge.  Patient goals were met. Patient is being discharged due to being pleased with the current functional level.  ?????        Byran Bilotti PT, DPT, LAT, ATC  12/29/18  4:30 PM

## 2019-01-05 ENCOUNTER — Ambulatory Visit (INDEPENDENT_AMBULATORY_CARE_PROVIDER_SITE_OTHER): Payer: 59 | Admitting: Neurology

## 2019-01-05 DIAGNOSIS — R51 Headache: Secondary | ICD-10-CM

## 2019-01-05 DIAGNOSIS — R0683 Snoring: Secondary | ICD-10-CM

## 2019-01-05 DIAGNOSIS — Z820 Family history of epilepsy and other diseases of the nervous system: Secondary | ICD-10-CM

## 2019-01-05 DIAGNOSIS — R519 Headache, unspecified: Secondary | ICD-10-CM

## 2019-01-05 DIAGNOSIS — R419 Unspecified symptoms and signs involving cognitive functions and awareness: Secondary | ICD-10-CM

## 2019-01-05 DIAGNOSIS — G4733 Obstructive sleep apnea (adult) (pediatric): Secondary | ICD-10-CM

## 2019-01-05 DIAGNOSIS — G4719 Other hypersomnia: Secondary | ICD-10-CM

## 2019-01-05 DIAGNOSIS — E669 Obesity, unspecified: Secondary | ICD-10-CM

## 2019-01-12 ENCOUNTER — Telehealth: Payer: Self-pay

## 2019-01-12 NOTE — Procedures (Signed)
[facility] @[facility] [place] [place], [place] [address] NAME:  Savannah George                                                                            DOB: 01/13/1982 MEDICAL RECORD no:  [id number]                                                       DOS: 01/05/2019   REFERRING PHYSICIAN: Briscoe Deutscher, DO STUDY PERFORMED: Home Sleep Test on [facility] HISTORY: 38 year old woman with a history of migraines, DVT, depression, anxiety, anemia, and obesity, who reports snoring and daytime tiredness; she does not wake up rested. Her Epworth sleepiness score is 11 out of 24, BMI of 38.3. STUDY RESULTS:   Total Recording Time: 7 hrs, 43 mins; Total Sleep Time: 6 hrs, 46 mins Total Apnea/Hypopnea Index (AHI): 6.1/h; RDI: 11.3/h; REM AHI: 15.7/h Average Oxygen Saturation:  96%; Lowest Oxygen Desaturation:  92%  Total Time Oxygen Saturation Below or at 88 %: 0 minutes  Average Heart Rate:  72 bpm (between 54 and 109 bpm) IMPRESSION: OSA    RECOMMENDATION: This home sleep test demonstrates overall mild or borderline obstructive sleep apnea with a total AHI of 6.1/hour and O2 nadir of 92%. Given the patient's medical history and sleep related complaints, treatment with positive airway pressure can be considered for symptom control. This can be achieved in the form of autoPAP trial/titration at home. A full night CPAP titration study would help with proper treatment settings and mask fitting, if needed down the road. Alternative treatments include weight loss along with avoidance of the supine sleep position, or an oral appliance in appropriate candidates. Please note that untreated obstructive sleep apnea may carry additional perioperative morbidity. Patients with significant obstructive sleep apnea should receive perioperative PAP therapy and the surgeons and particularly the anesthesiologist should be informed of the diagnosis and the severity of the sleep disordered breathing.  The patient should be cautioned not to drive, work at heights, or operate dangerous or heavy equipment when tired or sleepy. Review and reiteration of good sleep hygiene measures should be pursued with any patient. Other causes of the patient's symptoms, including circadian rhythm disturbances, an underlying mood disorder, medication effect and/or an underlying medical problem cannot be ruled out based on this test. Clinical correlation is recommended. The patient and her referring provider will be notified of the test results. The patient will be seen in follow up in sleep clinic at [facility].  I certify that I have reviewed the raw data recording prior to the issuance of this report in accordance with the standards of the American Academy of Sleep Medicine (AASM). Star Age, MD, PhD [facility]) Diplomat, ABPN (Neurology and Sleep)

## 2019-01-12 NOTE — Progress Notes (Signed)
I forgot to change year of birth to [birth year]... Patient referred by Dr. Juleen China, seen by me on 11/25/18, HST on 01/05/19.    Please call and notify the patient that the recent home sleep test showed obstructive sleep apnea. OSA is overall mild or even borderline, but we can try autoPAP to see if her tiredness improves. Other options include avoiding supine sleep position along with weight loss, a dental appliance through a dentist.  I have not put an order in for autoPAP yet. Let me know, what she would like to do. Overall AHI was 6.1/h, O2 nadir 92%.   Savannah Age, MD, PhD [facility])

## 2019-01-12 NOTE — Telephone Encounter (Signed)
-----   Message from Star Age, MD sent at 01/12/2019  8:02 AM EST ----- I forgot to change year of birth to [birth year]... Patient referred by Dr. Juleen China, seen by me on 11/25/18, HST on 01/05/19.    Please call and notify the patient that the recent home sleep test showed obstructive sleep apnea. OSA is overall mild or even borderline, but we can try autoPAP to see if her tiredness improves. Other options include avoiding supine sleep position along with weight loss, a dental appliance through a dentist.  I have not put an order in for autoPAP yet. Let me know, what she would like to do. Overall AHI was 6.1/h, O2 nadir 92%.   Star Age, MD, PhD [facility])

## 2019-01-12 NOTE — Telephone Encounter (Signed)
I called pt, advised her of her sleep study results. Pt declined an auto pap. She will consider the avoidance of supine sleep and pursue weight loss or speak with her dentist about a dental appliance. Pt verbalized understanding of results. Pt had no questions at this time but was encouraged to call back if questions arise.

## 2019-01-21 ENCOUNTER — Ambulatory Visit: Payer: 59 | Admitting: Family Medicine

## 2019-01-21 ENCOUNTER — Telehealth: Payer: Self-pay

## 2019-01-21 NOTE — Telephone Encounter (Signed)
Called l/m to call back so that we can work on getting in sooner.

## 2019-01-21 NOTE — Telephone Encounter (Signed)
Copied from [place] [id number]. Topic: Appointment Scheduling - Prior Auth Required for Appointment >> Jan 20, 2019  3:18 PM Vernona Rieger wrote: Pt states Dr Juleen China needed her to reschedule her appt with her for 1/29. She is re-scheduled for 2/17 but rather not wait that long. Would Dr Juleen China work her back in sooner? Please Advise

## 2019-01-21 NOTE — Progress Notes (Signed)
GYNECOLOGY  VISIT   HPI: 38 y.o.   Divorced White or Caucasian Not Hispanic or Latino  female   [id number] with No LMP recorded. (Menstrual status: IUD).   here for 1 month IUD recheck. She had a mirena placed earlier this month.   She has some spotting, no increase in baseline d/c. Notices an odor with the spotting. No itching, burning, irritation. No cramping, no dyspareunia.   GYNECOLOGIC HISTORY: No LMP recorded. (Menstrual status: IUD). Contraception: IUD Menopausal hormone therapy: None       OB History    Gravida  0   Para  0   Term  0   Preterm  0   AB  0   Living  0     SAB  0   TAB  0   Ectopic  0   Multiple  0   Live Births  0              Patient Active Problem List   Diagnosis Date Noted  . Urinary incontinence 11/06/2018  . Sleep-disordered breathing 10/25/2018  . Obesity (BMI 35.0-39.9 without comorbidity) 10/25/2018  . Situational depression 10/25/2018  . Amenorrhea 10/25/2018  . Family history of ovarian cancer 10/25/2018  . Pure hypercholesterolemia 10/25/2018  . Seasonal allergies 10/25/2018    Past Medical History:  Diagnosis Date  . Abnormal Pap smear of cervix    age 20 or 82  . Anemia   . Anxiety   . Depression   . DVT (deep venous thrombosis) ([place])   . Migraines   . Urinary incontinence     Past Surgical History:  Procedure Laterality Date  . CRYOTHERAPY     For abnormal pap smear, age 45 or 71  . INTRAUTERINE DEVICE INSERTION     PARAGUARD    Current Outpatient Medications  Medication Sig Dispense Refill  . Cholecalciferol (VITAMIN D PO) Take 2,000 Int'l Units by mouth.    . levonorgestrel (MIRENA) 20 MCG/24HR IUD 1 each by Intrauterine route once.    . loratadine (CLARITIN) 10 MG tablet Take 10 mg by mouth daily.    . Multiple Vitamins-Minerals (MULTIVITAMIN PO) Take by mouth.     No current facility-administered medications for this visit.      ALLERGIES: Aspirin and Latex  Family History  Problem Relation  Age of Onset  . Kidney cancer Mother   . Renal cancer Mother   . Diabetes Father   . Heart disease Father   . Stroke Father   . Uterine cancer Sister   . Diabetes Paternal Aunt   . Heart disease Maternal Grandfather   . Heart attack Maternal Grandfather   . Diabetes Paternal Grandmother   . Diabetes Paternal Grandfather   . Stroke Paternal Grandfather   . Heart attack Paternal Grandfather   . Ovarian cancer Sister     Social History   Socioeconomic History  . Marital status: Divorced    Spouse name: Not on file  . Number of children: Not on file  . Years of education: Not on file  . Highest education level: Master's degree (e.g., MA, MS, MEng, MEd, MSW, MBA)  Occupational History  . Occupation: Tree surgeon: [employer]  Social Needs  . Financial resource strain: Not on file  . Food insecurity:    Worry: Not on file    Inability: Not on file  . Transportation needs:    Medical: Not on file    Non-medical: Not on file  Tobacco Use  . Smoking status: Never Smoker  . Smokeless tobacco: Never Used  Substance and Sexual Activity  . Alcohol use: Yes    Alcohol/week: 0.0 - 3.0 standard drinks  . Drug use: Not Currently  . Sexual activity: Yes    Partners: Male, Female    Birth control/protection: I.U.D.  Lifestyle  . Physical activity:    Days per week: Not on file    Minutes per session: Not on file  . Stress: Not on file  Relationships  . Social connections:    Talks on phone: Not on file    Gets together: Not on file    Attends religious service: Not on file    Active member of club or organization: Not on file    Attends meetings of clubs or organizations: Not on file    Relationship status: Not on file  . Intimate partner violence:    Fear of current or ex partner: Not on file    Emotionally abused: Not on file    Physically abused: Not on file    Forced sexual activity: Not on file  Other Topics Concern  . Not on file  Social  History Narrative   Recently moved back to [place] after divorcing husband. Went to college here and still has friends here. Enjoys hiking. Trying to integrate herself back into the community.     Review of Systems  Constitutional: Negative.   HENT: Negative.   Eyes: Negative.   Respiratory: Negative.   Cardiovascular: Negative.   Gastrointestinal: Negative.   Genitourinary: Negative.   Musculoskeletal: Negative.   Skin: Negative.   Neurological: Negative.   Endo/Heme/Allergies: Negative.   Psychiatric/Behavioral: Negative.     PHYSICAL EXAMINATION:    BP 112/64 (BP Location: Right Arm, Patient Position: Sitting, Cuff Size: Large)   Pulse 76     General appearance: alert, cooperative and appears stated age   Pelvic: External genitalia:  no lesions              Urethra:  normal appearing urethra with no masses, tenderness or lesions              Bartholins and Skenes: normal                 Vagina: normal appearing vagina with a large amount of watery yellow/brown vaginal d/c, frothy              Cervix: no cervical motion tenderness, no lesions and IUD string 3-4 cm              Bimanual Exam:  Uterus:  normal size, contour, position, consistency, mobility, non-tender and anteverted              Adnexa: no mass, fullness, tenderness                Chaperone was present for exam.  Wet prep: + clue, no trich, rare wbc KOH: no yeast PH: 5.5   ASSESSMENT IUD check, doing well  Bacterial vaginitis     PLAN Routine f/u, due for annual in 10/20 Treat with flagyl, no ETOH   An After Visit Summary was printed and given to the patient.   CC: Evalee Mutton, CNM

## 2019-01-21 NOTE — Telephone Encounter (Signed)
Copied from [place] [id number]. Topic: Appointment Scheduling - Prior Auth Required for Appointment >> Jan 20, 2019  3:18 PM Vernona Rieger wrote: Pt states Dr Juleen China needed her to reschedule her appt with her for 1/29. She is re-scheduled for 2/17 but rather not wait that long. Would Dr Juleen China work her back in sooner? Please Advise

## 2019-01-22 ENCOUNTER — Other Ambulatory Visit: Payer: Self-pay

## 2019-01-22 ENCOUNTER — Ambulatory Visit: Payer: 59 | Admitting: Obstetrics and Gynecology

## 2019-01-22 ENCOUNTER — Encounter: Payer: Self-pay | Admitting: Obstetrics and Gynecology

## 2019-01-22 VITALS — BP 112/64 | HR 76 | Wt 173.0 lb

## 2019-01-22 DIAGNOSIS — Z30431 Encounter for routine checking of intrauterine contraceptive device: Secondary | ICD-10-CM

## 2019-01-22 DIAGNOSIS — N760 Acute vaginitis: Secondary | ICD-10-CM

## 2019-01-22 DIAGNOSIS — B9689 Other specified bacterial agents as the cause of diseases classified elsewhere: Secondary | ICD-10-CM

## 2019-01-22 MED ORDER — METRONIDAZOLE 500 MG PO TABS: 500.0000 mg | ORAL_TABLET | Freq: Two times a day (BID) | ORAL | 0 refills | Status: DC

## 2019-01-22 NOTE — Patient Instructions (Signed)
Vaginitis  Vaginitis is a condition in which the vaginal tissue swells and becomes red (inflamed). This condition is most often caused by a change in the normal balance of bacteria and yeast that live in the vagina. This change causes an overgrowth of certain bacteria or yeast, which causes the inflammation. There are different types of vaginitis, but the most common types are:   Bacterial vaginosis.   Yeast infection (candidiasis).   Trichomoniasis vaginitis. This is a sexually transmitted disease (STD).   Viral vaginitis.   Atrophic vaginitis.   Allergic vaginitis.  What are the causes?  The cause of this condition depends on the type of vaginitis. It can be caused by:   Bacteria (bacterial vaginosis).   Yeast, which is a fungus (yeast infection).   A parasite (trichomoniasis vaginitis).   A virus (viral vaginitis).   Low hormone levels (atrophic vaginitis). Low hormone levels can occur during pregnancy, breastfeeding, or after menopause.   Irritants, such as bubble baths, scented tampons, and feminine sprays (allergic vaginitis).  Other factors can change the normal balance of the yeast and bacteria that live in the vagina. These include:   Antibiotic medicines.   Poor hygiene.   Diaphragms, vaginal sponges, spermicides, birth control pills, and intrauterine devices (IUD).   Sex.   Infection.   Uncontrolled diabetes.   A weakened defense (immune) system.  What increases the risk?  This condition is more likely to develop in women who:   Smoke.   Use vaginal douches, scented tampons, or scented sanitary pads.   Wear tight-fitting pants.   Wear thong underwear.   Use oral birth control pills or an IUD.   Have sex without a condom.   Have multiple sex partners.   Have an STD.   Frequently use the spermicide nonoxynol-9.   Eat lots of foods high in sugar.   Have uncontrolled diabetes.   Have low estrogen levels.   Have a weakened immune system from an immune disorder or medical  treatment.   Are pregnant or breastfeeding.  What are the signs or symptoms?  Symptoms vary depending on the cause of the vaginitis. Common symptoms include:   Abnormal vaginal discharge.  ? The discharge is white, gray, or yellow with bacterial vaginosis.  ? The discharge is thick, white, and cheesy with a yeast infection.  ? The discharge is frothy and yellow or greenish with trichomoniasis.   A bad vaginal smell. The smell is fishy with bacterial vaginosis.   Vaginal itching, pain, or swelling.   Sex that is painful.   Pain or burning when urinating.  Sometimes there are no symptoms.  How is this diagnosed?  This condition is diagnosed based on your symptoms and medical history. A physical exam, including a pelvic exam, will also be done. You may also have other tests, including:   Tests to determine the pH level (acidity or alkalinity) of your vagina.   A whiff test, to assess the odor that results when a sample of your vaginal discharge is mixed with a potassium hydroxide solution.   Tests of vaginal fluid. A sample will be examined under a microscope.  How is this treated?  Treatment varies depending on the type of vaginitis you have. Your treatment may include:   Antibiotic creams or pills to treat bacterial vaginosis and trichomoniasis.   Antifungal medicines, such as vaginal creams or suppositories, to treat a yeast infection.   Medicine to ease discomfort if you have viral vaginitis. Your sexual partner   should also be treated.   Estrogen delivered in a cream, pill, suppository, or vaginal ring to treat atrophic vaginitis. If vaginal dryness occurs, lubricants and moisturizing creams may help. You may need to avoid scented soaps, sprays, or douches.   Stopping use of a product that is causing allergic vaginitis. Then using a vaginal cream to treat the symptoms.  Follow these instructions at home:  Lifestyle   Keep your genital area clean and dry. Avoid soap, and only rinse the area with  water.   Do not douche or use tampons until your health care provider says it is okay to do so. Use sanitary pads, if needed.   Do not have sex until your health care provider approves. When you can return to sex, practice safe sex and use condoms.   Wipe from front to back. This avoids the spread of bacteria from the rectum to the vagina.  General instructions   Take over-the-counter and prescription medicines only as told by your health care provider.   If you were prescribed an antibiotic medicine, take or use it as told by your health care provider. Do not stop taking or using the antibiotic even if you start to feel better.   Keep all follow-up visits as told by your health care provider. This is important.  How is this prevented?   Use mild, non-scented products. Do not use things that can irritate the vagina, such as fabric softeners. Avoid the following products if they are scented:  ? Feminine sprays.  ? Detergents.  ? Tampons.  ? Feminine hygiene products.  ? Soaps or bubble baths.   Let air reach your genital area.  ? Wear cotton underwear to reduce moisture buildup.  ? Avoid wearing underwear while you sleep.  ? Avoid wearing tight pants and underwear or nylons without a cotton panel.  ? Avoid wearing thong underwear.   Take off any wet clothing, such as bathing suits, as soon as possible.   Practice safe sex and use condoms.  Contact a health care provider if:   You have abdominal pain.   You have a fever.   You have symptoms that last for more than 2-3 days.  Get help right away if:   You have a fever and your symptoms suddenly get worse.  Summary   Vaginitis is a condition in which the vaginal tissue becomes inflamed.This condition is most often caused by a change in the normal balance of bacteria and yeast that live in the vagina.   Treatment varies depending on the type of vaginitis you have.   Do not douche, use tampons , or have sex until your health care provider approves. When  you can return to sex, practice safe sex and use condoms.  This information is not intended to replace advice given to you by your health care provider. Make sure you discuss any questions you have with your health care provider.  Document Released: 10/07/2007 Document Revised: 01/15/2017 Document Reviewed: 01/15/2017  Elsevier Interactive Patient Education  2019 [employer] Inc[employer]

## 2019-01-23 ENCOUNTER — Telehealth: Payer: Self-pay | Admitting: Certified Nurse Midwife

## 2019-01-23 MED ORDER — METRONIDAZOLE 0.75 % VA GEL: VAGINAL | 0 refills | Status: DC

## 2019-01-23 MED ORDER — ONDANSETRON HCL 4 MG PO TABS: 4.0000 mg | ORAL_TABLET | Freq: Three times a day (TID) | ORAL | 0 refills | Status: DC | PRN

## 2019-01-23 NOTE — Telephone Encounter (Signed)
Spoke with Dr. Talbert Nan.  Patient reviewed and verbal orders received and entered for cosign. Contacted patient.  New prescriptions and instructions given.  Patient advised to call back if has any concerns or symptoms do not improve or if worsening seek care in urgent care for evaluation. Verbalized understanding. Encounter closed.

## 2019-01-23 NOTE — Telephone Encounter (Signed)
Patient calling with reports of "severe nausea" after starting oral Flagyl. Has taken 3 pills for treatment of bacterial vaginosis.  Denies fevers, vomiting, diarrhea, pelvic pain, recent alcohol use. No cough syrups or mouth washes that have alcohol.  She has taken the flagyl with a full meal and states that does not help the nausea either.  She is able to eat and drink as normal.   Has Latex and ASA allergy.   Advised will send message to Dr. Talbert Nan and obtain instructions for her care.  Pt agreeable.

## 2019-01-23 NOTE — Telephone Encounter (Signed)
Patient is having severe nausea and "just feels terrible" She is wondering if this related to her new medication metronidazole.

## 2019-01-25 NOTE — Telephone Encounter (Signed)
Please call patient and get her in sooner. Okay to use same-day slot if needed.

## 2019-01-26 NOTE — Telephone Encounter (Signed)
Called pt and left vm to reschedule sooner.

## 2019-01-27 NOTE — Telephone Encounter (Signed)
App made hold placed for same day for them to remove so that I can put in. Patient informed

## 2019-02-03 ENCOUNTER — Ambulatory Visit: Payer: 59 | Admitting: Family Medicine

## 2019-02-03 VITALS — BP 120/64 | HR 102 | Temp 98.6°F | Ht 61.0 in | Wt 206.2 lb

## 2019-02-03 DIAGNOSIS — M797 Fibromyalgia: Secondary | ICD-10-CM | POA: Diagnosis not present

## 2019-02-03 DIAGNOSIS — R5382 Chronic fatigue, unspecified: Secondary | ICD-10-CM | POA: Diagnosis not present

## 2019-02-03 DIAGNOSIS — Z975 Presence of (intrauterine) contraceptive device: Secondary | ICD-10-CM

## 2019-02-03 DIAGNOSIS — G479 Sleep disorder, unspecified: Secondary | ICD-10-CM

## 2019-02-03 DIAGNOSIS — E669 Obesity, unspecified: Secondary | ICD-10-CM | POA: Diagnosis not present

## 2019-02-03 DIAGNOSIS — G4733 Obstructive sleep apnea (adult) (pediatric): Secondary | ICD-10-CM

## 2019-02-03 MED ORDER — AMPHETAMINE-DEXTROAMPHETAMINE 10 MG PO TABS: 10.0000 mg | ORAL_TABLET | Freq: Two times a day (BID) | ORAL | 0 refills | Status: DC

## 2019-02-03 MED ORDER — TRAZODONE HCL 50 MG PO TABS: ORAL_TABLET | ORAL | 3 refills | Status: DC

## 2019-02-03 NOTE — Progress Notes (Signed)
Savannah George is a 38 y.o. female is here for follow up.  History of Present Illness:   HPI:    Pain and fatigue Patient is tearful today.  She did have a home sleep study that showed mild sleep apnea.  The sleep specialist was concerned about another sleep disorder per the patient's report but they were unable to obtain a facility sleep study due to insurance denial.  Her generalized pain, especially her back and thighs continues.  She continues to have severe fatigue during the day and then difficulty with staying asleep at night.  She has been worried about her job since there have been some days that she is too fatigued or in too much pain to go to work.  Sleep-disorder Patient was referred to sleep specialist for concern for sleep disorder.  Unfortunately, insurance would only cover a home sleep study that was positive for mild sleep apnea.  A CPAP was offered as an option but was not deemed necessary, so the patient declined.  Situational depression Current symptoms include depressed mood, fatigue and hypersomnia. Symptoms have been unchanged since that time. Patient denies recurrent thoughts of death.   Obesity (BMI 35.0-39.9 without comorbidity) Wt Readings from Last 3 Encounters:  02/03/19 206 lb 3.2 oz (93.5 kg)  01/22/19 173 lb (78.5 kg)  12/25/18 173 lb 9.6 oz (78.7 kg)   Amenorrhea Followed by OBGYN.  Had IUD placed on January 2.  Mirena.  Likes it so far.  Noted history of copper IUD that she kept for 5 years.  Health Maintenance Due  Topic Date Due  . TETANUS/TDAP  01/13/1999   Depression screen PHQ 2/9 02/03/2019  Decreased Interest 0  Down, Depressed, Hopeless 0  PHQ - 2 Score 0  Altered sleeping 2  Tired, decreased energy 1  Change in appetite 0  Feeling bad or failure about yourself  0  Trouble concentrating 1  Moving slowly or fidgety/restless 0  Suicidal thoughts 0  PHQ-9 Score 4  Difficult doing work/chores Not difficult at all   PMHx, SurgHx,  SocialHx, FamHx, Medications, and Allergies were reviewed in the Visit Navigator and updated as appropriate.   Patient Active Problem List   Diagnosis Date Noted  . Chronic fatigue 02/04/2019  . Fibromyalgia 02/04/2019  . OSA, without need for CPAP 02/04/2019  . IUD, Mirena, placed 12/25/18 02/04/2019  . Urinary incontinence 11/06/2018  . Sleep disorder 10/25/2018  . Obesity (BMI 35.0-39.9 without comorbidity) 10/25/2018  . Situational depression 10/25/2018  . Amenorrhea 10/25/2018  . Family history of ovarian cancer 10/25/2018  . Pure hypercholesterolemia 10/25/2018  . Seasonal allergies 10/25/2018   Social History   Tobacco Use  . Smoking status: Never Smoker  . Smokeless tobacco: Never Used  Substance Use Topics  . Alcohol use: Yes    Alcohol/week: 0.0 - 3.0 standard drinks  . Drug use: Not Currently   Current Medications and Allergies   .  Cholecalciferol (VITAMIN D PO), Take 2,000 Int'l Units by mouth., Disp: , Rfl:  .  levonorgestrel (MIRENA) 20 MCG/24HR IUD, 1 each by Intrauterine route once., Disp: , Rfl:  .  loratadine (CLARITIN) 10 MG tablet, Take 10 mg by mouth daily., Disp: , Rfl:  .  Multiple Vitamins-Minerals (MULTIVITAMIN PO), Take by mouth., Disp: , Rfl:    Allergies  Allergen Reactions  . Aspirin Hives  . Latex Hives   Review of Systems   Pertinent items are noted in the HPI. Otherwise, a complete ROS is negative.  Vitals   Vitals:   02/03/19 1534  BP: 120/64  Pulse: (!) 102  Temp: 98.6 F (37 C)  TempSrc: Oral  SpO2: 98%  Weight: 206 lb 3.2 oz (93.5 kg)  Height: 5\' 1"  (1.549 m)     Body mass index is 38.96 kg/m.  Physical Exam   Physical Exam Vitals signs and nursing note reviewed.  HENT:     Head: Normocephalic and atraumatic.  Eyes:     Pupils: Pupils are equal, round, and reactive to light.  Neck:     Musculoskeletal: Normal range of motion and neck supple.  Cardiovascular:     Rate and Rhythm: Normal rate and regular rhythm.       Heart sounds: Normal heart sounds.  Pulmonary:     Effort: Pulmonary effort is normal.  Abdominal:     Palpations: Abdomen is soft.  Skin:    General: Skin is warm.  Psychiatric:        Behavior: Behavior normal.     Assessment and Plan   Savannah George was seen today for follow-up.  Diagnoses and all orders for this visit:  Chronic fatigue -     CBC with Differential/Platelet -     ANA -     Thyroid Peroxidase Antibodies (TPO) (REFL) -     C-reactive protein -     Sedimentation rate -     amphetamine-dextroamphetamine (ADDERALL) 10 MG tablet; Take 1 tablet (10 mg total) by mouth 2 (two) times daily.  Obesity (BMI 35.0-39.9 without comorbidity)  IUD, Mirena, placed 12/25/18  Sleep disorder -     CBC with Differential/Platelet -     ANA -     Thyroid Peroxidase Antibodies (TPO) (REFL) -     C-reactive protein -     Sedimentation rate -     traZODone (DESYREL) 50 MG tablet; 1/2 to one tab daily  OSA, without need for CPAP  Fibromyalgia -     CBC with Differential/Platelet -     ANA -     Thyroid Peroxidase Antibodies (TPO) (REFL) -     C-reactive protein -     Sedimentation rate    . Orders and follow up as documented in [place], reviewed diet, exercise and weight control, cardiovascular risk and specific lipid/LDL goals reviewed, reviewed medications and side effects in detail.  . Reviewed expectations re: course of current medical issues. . Outlined signs and symptoms indicating need for more acute intervention. . Patient verbalized understanding and all questions were answered. . Patient received an After Visit Summary.  CMA served as Education administrator during this visit. History, Physical, and Plan performed by medical provider. The above documentation has been reviewed and is accurate and complete. Briscoe Deutscher, D.O.  Briscoe Deutscher, DO [place], [facility] 02/04/2019

## 2019-02-04 ENCOUNTER — Encounter: Payer: Self-pay | Admitting: Family Medicine

## 2019-02-04 DIAGNOSIS — M797 Fibromyalgia: Secondary | ICD-10-CM | POA: Insufficient documentation

## 2019-02-04 DIAGNOSIS — G4733 Obstructive sleep apnea (adult) (pediatric): Secondary | ICD-10-CM | POA: Insufficient documentation

## 2019-02-04 DIAGNOSIS — Z975 Presence of (intrauterine) contraceptive device: Secondary | ICD-10-CM | POA: Insufficient documentation

## 2019-02-04 DIAGNOSIS — R5382 Chronic fatigue, unspecified: Secondary | ICD-10-CM | POA: Insufficient documentation

## 2019-02-04 LAB — CBC WITH DIFFERENTIAL/PLATELET
Basophils Absolute: 0.2 10*3/uL — ABNORMAL HIGH (ref 0.0–0.1)
Basophils Relative: 1.6 % (ref 0.0–3.0)
Eosinophils Absolute: 0.2 10*3/uL (ref 0.0–0.7)
Eosinophils Relative: 1.4 % (ref 0.0–5.0)
HCT: 38.6 % (ref 36.0–46.0)
Hemoglobin: 13.2 g/dL (ref 12.0–15.0)
Lymphocytes Relative: 28.6 % (ref 12.0–46.0)
Lymphs Abs: 3.4 10*3/uL (ref 0.7–4.0)
MCHC: 34.1 g/dL (ref 30.0–36.0)
MCV: 88.5 fl (ref 78.0–100.0)
Monocytes Absolute: 0.7 10*3/uL (ref 0.1–1.0)
Monocytes Relative: 5.8 % (ref 3.0–12.0)
Neutro Abs: 7.3 10*3/uL (ref 1.4–7.7)
Neutrophils Relative %: 62.6 % (ref 43.0–77.0)
Platelets: 422 10*3/uL — ABNORMAL HIGH (ref 150.0–400.0)
RBC: 4.36 Mil/uL (ref 3.87–5.11)
RDW: 13.2 % (ref 11.5–15.5)
WBC: 11.7 10*3/uL — ABNORMAL HIGH (ref 4.0–10.5)

## 2019-02-04 LAB — ANA: Anti Nuclear Antibody(ANA): NEGATIVE

## 2019-02-04 LAB — SEDIMENTATION RATE: Sed Rate: 11 mm/hr (ref 0–20)

## 2019-02-04 LAB — THYROID PEROXIDASE ANTIBODIES (TPO) (REFL): Thyroperoxidase Ab SerPl-aCnc: <1 IU/mL (ref <9)

## 2019-02-04 LAB — C-REACTIVE PROTEIN: CRP: 1 mg/dL (ref 0.5–20.0)

## 2019-02-09 ENCOUNTER — Ambulatory Visit: Payer: 59 | Admitting: Family Medicine

## 2019-02-20 ENCOUNTER — Telehealth: Payer: Self-pay | Admitting: Family Medicine

## 2019-02-20 NOTE — Telephone Encounter (Signed)
See note

## 2019-02-20 NOTE — Telephone Encounter (Signed)
Copied from [place] [id number]. Topic: Quick Communication - See Telephone Encounter >> Feb 20, 2019  2:20 PM Sheran Luz wrote: CRM for notification. See Telephone encounter for: 02/20/19.  Patient called to inquire what she would need to pick up from office to take to her employer regarding "reasonable accommodation" as discussed at last OV with Dr. Juleen China on 2/11. Patient states that she is unsure if this would be FMLA paperwork and also unsure if she should make appointment as she is scheduled to be seen on 3/10. Patient is requesting a call back to discuss further. Please advise.

## 2019-02-23 NOTE — Telephone Encounter (Signed)
I believe that we discussed desk setup. Possibly standing desk. Okay to call patient for details. Should just need letter. Okay to write letter.

## 2019-03-02 NOTE — Progress Notes (Signed)
Savannah George is a 38 y.o. female is here for follow up.  Assessment and Plan:   Chronic fatigue Patient has noticed a huge difference in energy when using the Adderall.  She has been taking 10 mg in the morning and skipping the afternoon dose many days due to either forgetting the medication or feeling too jittery to take the second dose.  She feels much more in control.  Her focus is better.  And her brain fog has lightened significantly.  She denies any headaches, dizziness, constipation.  She denies chest pain, shortness of breath, edema.  She has been taking tramadol at night to regulate sleep and feels that her sleep is improving.  She is still getting less than 6 hours total.  Her goal is 7-1/2 per night.  We discussed increasing the trazodone to 100 mg p.o. nightly.  Decrease the Adderall to 5 mg every morning with an as needed 5 mg dose in the afternoon.  She was instructed to drink plenty of fluids and exercise.  We discussed considering Provigil in the future if the Adderall has to any side effects.  Fibromyalgia Improving with sleep regulation.  We will see if this continues.  Consider baclofen at night if needed.  She does endorse some leg cramps at night.  No restless leg syndrome.  No back pain.  She has been taking electrolyte supplements for this.  Will check labs today.  Morbid obesity ([place]) The patient is asked to make an attempt to improve diet and exercise patterns to aid in medical management of this problem.   Orders Placed This Encounter  Procedures  . Tdap vaccine greater than or equal to 7yo IM  . Basic metabolic panel  . Magnesium   Meds ordered this encounter  Medications  . amphetamine-dextroamphetamine (ADDERALL) 10 MG tablet    Sig: 1/2 to 1 po q am for hypersomnia.    Dispense:  90 tablet    Refill:  0    Subjective:   HPI: See Assessment and Plan section for Problem Based Charting of issues discussed today.   Health Maintenance:   There are no  preventive care reminders to display for this patient. Depression screen [facility] 2/9 03/03/2019 02/03/2019  Decreased Interest 0 0  Down, Depressed, Hopeless 0 0  PHQ - 2 Score 0 0  Altered sleeping 1 2  Tired, decreased energy 1 1  Change in appetite 3 0  Feeling bad or failure about yourself  0 0  Trouble concentrating 1 1  Moving slowly or fidgety/restless 1 0  Suicidal thoughts 0 0  PHQ-9 Score 7 4  Difficult doing work/chores Somewhat difficult Not difficult at all   PMHx, SurgHx, SocialHx, FamHx, Medications, and Allergies were reviewed in the Visit Navigator and updated as appropriate.   Patient Active Problem List   Diagnosis Date Noted  . Chronic fatigue 02/04/2019  . Fibromyalgia 02/04/2019  . OSA, without need for CPAP 02/04/2019  . IUD, Mirena, placed 12/25/18 02/04/2019  . Urinary incontinence 11/06/2018  . Sleep disorder 10/25/2018  . Morbid obesity ([place]) 10/25/2018  . Situational depression 10/25/2018  . Amenorrhea 10/25/2018  . Family history of ovarian cancer 10/25/2018  . Pure hypercholesterolemia 10/25/2018  . Seasonal allergies 10/25/2018   Social History   Tobacco Use  . Smoking status: Never Smoker  . Smokeless tobacco: Never Used  Substance Use Topics  . Alcohol use: Yes    Alcohol/week: 0.0 - 3.0 standard drinks  . Drug use: Not Currently  Current Medications and Allergies:   .  amphetamine-dextroamphetamine (ADDERALL) 10 MG tablet, Take 1 tablet (10 mg total) by mouth 2 (two) times daily., Disp: 60 tablet, Rfl: 0 .  Cholecalciferol (VITAMIN D PO), Take 2,000 Int'l Units by mouth., Disp: , Rfl:  .  levonorgestrel (MIRENA) 20 MCG/24HR IUD, 1 each by Intrauterine route once., Disp: , Rfl:  .  loratadine (CLARITIN) 10 MG tablet, Take 10 mg by mouth daily., Disp: , Rfl:  .  Multiple Vitamins-Minerals (MULTIVITAMIN PO), Take by mouth., Disp: , Rfl:  .  traZODone (DESYREL) 50 MG tablet, 1/2 to one tab daily, Disp: 30 tablet, Rfl: 3   Allergies  Allergen  Reactions  . Aspirin Hives  . Latex Hives   Review of Systems   Pertinent items are noted in the HPI. Otherwise, ROS is negative.  Vitals:   Vitals:   03/03/19 1331  BP: 110/80  Pulse: (!) 113  SpO2: 98%  Weight: 200 lb (90.7 kg)  Height: 5\' 1"  (1.549 m)     Body mass index is 37.79 kg/m. Physical Exam:   General: Cooperative, alert and oriented, well developed, well nourished, in no acute distress. HEENT: Pupils equal round reactive light and extraocular movements intact. Conjunctivae and lids unremarkable. No pallor or cyanosis, dentition good. Neck: No thyromegaly.  Cardiovascular: Regular rhythm. No murmurs appreciated.  Lungs: Normal work of breathing. Clear bilaterally without rales, rhonchi, or wheezing.  Abdomen: Soft, nontender, no masses. Normal bowel sounds. Extremities: No clubbing, cyanosis, erythema. No edema.  Skin: Warm and dry. Neurologic: No focal deficits.  Psychiatric: Normal affect and thought content.   . Reviewed expectations re: course of current medical issues. . Discussed self-management of symptoms. . Outlined signs and symptoms indicating need for more acute intervention. . Patient verbalized understanding and all questions were answered. Marland Kitchen Health Maintenance issues including appropriate healthy diet, exercise, and smoking avoidance were discussed with patient. . See orders for this visit as documented in the electronic medical record. . Patient received an After Visit Summary.  Briscoe Deutscher, DO [place], [place] [facility] 03/08/2019

## 2019-03-03 ENCOUNTER — Ambulatory Visit: Payer: 59 | Admitting: Family Medicine

## 2019-03-03 ENCOUNTER — Encounter: Payer: Self-pay | Admitting: Family Medicine

## 2019-03-03 VITALS — BP 110/80 | HR 113 | Ht 61.0 in | Wt 200.0 lb

## 2019-03-03 DIAGNOSIS — R5382 Chronic fatigue, unspecified: Secondary | ICD-10-CM

## 2019-03-03 DIAGNOSIS — M797 Fibromyalgia: Secondary | ICD-10-CM | POA: Diagnosis not present

## 2019-03-03 DIAGNOSIS — Z23 Encounter for immunization: Secondary | ICD-10-CM | POA: Diagnosis not present

## 2019-03-03 DIAGNOSIS — R252 Cramp and spasm: Secondary | ICD-10-CM | POA: Diagnosis not present

## 2019-03-03 LAB — BASIC METABOLIC PANEL
BUN: 17 mg/dL (ref 6–23)
CO2: 28 mEq/L (ref 19–32)
Calcium: 9.6 mg/dL (ref 8.4–10.5)
Chloride: 102 mEq/L (ref 96–112)
Creatinine, Ser: 0.76 mg/dL (ref 0.40–1.20)
GFR: 84.66 mL/min (ref >60.00)
Glucose, Bld: 93 mg/dL (ref 70–99)
Potassium: 3.9 mEq/L (ref 3.5–5.1)
Sodium: 138 mEq/L (ref 135–145)

## 2019-03-03 LAB — MAGNESIUM: Magnesium: 1.8 mg/dL (ref 1.5–2.5)

## 2019-03-03 MED ORDER — AMPHETAMINE-DEXTROAMPHETAMINE 10 MG PO TABS: ORAL_TABLET | ORAL | 0 refills | Status: DC

## 2019-03-03 NOTE — Patient Instructions (Signed)
Increase Trazodone to 75-100 mg per night. Recheck in 2 months.   [email or web address]

## 2019-03-03 NOTE — Telephone Encounter (Signed)
Spoke to patient while in office she did not need letter for accomodation. She had questions about FMLA. Patient will pick up forms and bring by out office for completion.

## 2019-03-08 NOTE — Assessment & Plan Note (Signed)
Patient has noticed a huge difference in energy when using the Adderall.  She has been taking 10 mg in the morning and skipping the afternoon dose many days due to either forgetting the medication or feeling too jittery to take the second dose.  She feels much more in control.  Her focus is better.  And her brain fog has lightened significantly.  She denies any headaches, dizziness, constipation.  She denies chest pain, shortness of breath, edema.  She has been taking tramadol at night to regulate sleep and feels that her sleep is improving.  She is still getting less than 6 hours total.  Her goal is 7-1/2 per night.  We discussed increasing the trazodone to 100 mg p.o. nightly.  Decrease the Adderall to 5 mg every morning with an as needed 5 mg dose in the afternoon.  She was instructed to drink plenty of fluids and exercise.  We discussed considering Provigil in the future if the Adderall has to any side effects.

## 2019-03-08 NOTE — Assessment & Plan Note (Addendum)
Improving with sleep regulation.  We will see if this continues.  Consider baclofen at night if needed.  She does endorse some leg cramps at night.  No restless leg syndrome.  No back pain.  She has been taking electrolyte supplements for this.  Will check labs today.

## 2019-03-08 NOTE — Assessment & Plan Note (Signed)
The patient is asked to make an attempt to improve diet and exercise patterns to aid in medical management of this problem.  

## 2019-03-25 ENCOUNTER — Encounter: Payer: Self-pay | Admitting: Family Medicine

## 2019-04-01 ENCOUNTER — Telehealth: Payer: Self-pay | Admitting: Family Medicine

## 2019-04-01 NOTE — Telephone Encounter (Signed)
Just received forms, will work on this tomorrow.

## 2019-04-01 NOTE — Telephone Encounter (Signed)
Copied from [place] [id number]. Topic: General - Other >> Apr 01, 2019  2:47 PM Berneta Levins wrote: Reason for CRM:   See MyChart message from 03/25/2019.  Pt calling to check on status of paperwork. Pt can be reached at [phone]

## 2019-04-02 NOTE — Telephone Encounter (Signed)
Responded to pt via MyChart

## 2019-05-04 ENCOUNTER — Telehealth (INDEPENDENT_AMBULATORY_CARE_PROVIDER_SITE_OTHER): Payer: 59 | Admitting: Family Medicine

## 2019-05-04 ENCOUNTER — Encounter: Payer: Self-pay | Admitting: Family Medicine

## 2019-05-04 ENCOUNTER — Other Ambulatory Visit: Payer: Self-pay

## 2019-05-04 VITALS — Ht 61.0 in | Wt 200.0 lb

## 2019-05-04 DIAGNOSIS — R5382 Chronic fatigue, unspecified: Secondary | ICD-10-CM

## 2019-05-04 DIAGNOSIS — E7800 Pure hypercholesterolemia, unspecified: Secondary | ICD-10-CM

## 2019-05-04 DIAGNOSIS — Z975 Presence of (intrauterine) contraceptive device: Secondary | ICD-10-CM

## 2019-05-04 DIAGNOSIS — R739 Hyperglycemia, unspecified: Secondary | ICD-10-CM

## 2019-05-04 MED ORDER — AMPHETAMINE-DEXTROAMPHETAMINE 10 MG PO TABS: ORAL_TABLET | ORAL | 0 refills | Status: DC

## 2019-05-04 NOTE — Progress Notes (Signed)
Virtual Visit via Video   Due to the COVID-19 pandemic, this visit was completed with telemedicine (audio/video) technology to reduce patient and provider exposure as well as to preserve personal protective equipment.   I connected with Savannah George by a video enabled telemedicine application and verified that I am speaking with the correct person using two identifiers. Location patient: Home Location provider: [facility], Office Persons participating in the virtual visit: Savannah George, Savannah Deutscher, DO Savannah George, CMA acting as scribe for Dr. Briscoe George.   I discussed the limitations of evaluation and management by telemedicine and the availability of in person appointments. The patient expressed understanding and agreed to proceed.  Care Team   Patient Care Team: Savannah Deutscher, DO as PCP - General (Family Medicine) Savannah Dom, MD as Consulting Physician (Obstetrics and Gynecology) [place] as Physical Therapist (Physical Medicine and Rehabilitation)  Subjective:   HPI:   Chronic fatigue Patient has noticed a huge difference in energy when using the Adderall.  She has been taking 10 mg in the morning and skipping the afternoon dose many days due to either forgetting the medication or feeling too jittery to take the second dose.  She feels much more in control.  Her focus is better.  And her brain fog has lightened significantly.  She denies any headaches, dizziness, constipation.  She denies chest pain, shortness of breath, edema.  She has been taking tramadol at night to regulate sleep and feels that her sleep is improving.  She is still getting less than 6 hours total.  Her goal is 7-1/2 per night.  We discussed increasing the trazodone to 100 mg p.o. nightly.  Decrease the Adderall to 5 mg every morning with an as needed 5 mg dose in the afternoon.  She was instructed to drink plenty of fluids and exercise.  We discussed  considering Provigil in the future if the Adderall has to any side effects.  Fibromyalgia Improving with sleep regulation.  We will see if this continues.  Consider baclofen at night if needed.  She does endorse some leg cramps at night.  No restless leg syndrome.  No back pain.  She has been taking electrolyte supplements for this.  Will check labs today.  Morbid obesity ([place]) The patient is asked to make an attempt to improve diet and exercise patterns to aid in medical management of this problem.   Review of Systems  Constitutional: Negative for chills, fever, malaise/fatigue and weight loss.  Respiratory: Negative for cough, shortness of breath and wheezing.   Cardiovascular: Negative for chest pain, palpitations and leg swelling.  Gastrointestinal: Negative for abdominal pain, constipation, diarrhea, nausea and vomiting.  Genitourinary: Negative for dysuria and urgency.  Musculoskeletal: Negative for joint pain and myalgias.  Skin: Negative for rash.  Neurological: Negative for dizziness and headaches.  Psychiatric/Behavioral: Negative for depression, substance abuse and suicidal ideas. The patient is not nervous/anxious.     Patient Active Problem List   Diagnosis Date Noted  . Chronic fatigue 02/04/2019  . Fibromyalgia 02/04/2019  . OSA, without need for CPAP 02/04/2019  . IUD, Mirena, placed 12/25/18 02/04/2019  . Urinary incontinence 11/06/2018  . Sleep disorder 10/25/2018  . Morbid obesity ([place]) 10/25/2018  . Situational depression 10/25/2018  . Amenorrhea 10/25/2018  . Family history of ovarian cancer 10/25/2018  . Pure hypercholesterolemia 10/25/2018  . Seasonal allergies 10/25/2018    Social History   Tobacco Use  . Smoking status: Never  Smoker  . Smokeless tobacco: Never Used  Substance Use Topics  . Alcohol use: Yes    Alcohol/week: 0.0 - 3.0 standard drinks    Current Outpatient Medications:  .  amphetamine-dextroamphetamine (ADDERALL) 10 MG tablet, 1/2 to  1 po q am for hypersomnia., Disp: 90 tablet, Rfl: 0 .  Cholecalciferol (VITAMIN D PO), Take 2,000 Int'l Units by mouth., Disp: , Rfl:  .  levonorgestrel (MIRENA) 20 MCG/24HR IUD, 1 each by Intrauterine route once., Disp: , Rfl:  .  loratadine (CLARITIN) 10 MG tablet, Take 10 mg by mouth daily., Disp: , Rfl:  .  Multiple Vitamins-Minerals (MULTIVITAMIN PO), Take by mouth., Disp: , Rfl:  .  traZODone (DESYREL) 50 MG tablet, 1/2 to one tab daily, Disp: 30 tablet, Rfl: 3  Allergies  Allergen Reactions  . Aspirin Hives  . Latex Hives   Objective:   VITALS: Per patient if applicable, see vitals. GENERAL: Alert, appears well and in no acute distress. HEENT: Atraumatic, conjunctiva clear, no obvious abnormalities on inspection of external nose and ears. NECK: Normal movements of the head and neck. CARDIOPULMONARY: No increased WOB. Speaking in clear sentences. I:E ratio WNL.  MS: Moves all visible extremities without noticeable abnormality. PSYCH: Pleasant and cooperative, well-groomed. Speech normal rate and rhythm. Affect is appropriate. Insight and judgement are appropriate. Attention is focused, linear, and appropriate.  NEURO: CN grossly intact. Oriented as arrived to appointment on time with no prompting. Moves both UE equally.  SKIN: No obvious lesions, wounds, erythema, or cyanosis noted on face or hands.  Depression screen [facility] 2/9 03/03/2019 02/03/2019  Decreased Interest 0 0  Down, Depressed, Hopeless 0 0  PHQ - 2 Score 0 0  Altered sleeping 1 2  Tired, decreased energy 1 1  Change in appetite 3 0  Feeling bad or failure about yourself  0 0  Trouble concentrating 1 1  Moving slowly or fidgety/restless 1 0  Suicidal thoughts 0 0  PHQ-9 Score 7 4  Difficult doing work/chores Somewhat difficult Not difficult at all   Assessment and Plan:   [name] was seen today for follow-up.  Diagnoses and all orders for this visit:  Morbid obesity ([place])  Chronic fatigue -     CBC with  Differential/Platelet; Future -     TSH; Future -     amphetamine-dextroamphetamine (ADDERALL) 10 MG tablet; 1/2 to 1 po q am for hypersomnia.  Pure hypercholesterolemia -     Comprehensive metabolic panel; Future -     Lipid panel; Future  IUD, Mirena, placed 12/25/18  Hyperglycemia -     Hemoglobin A1c; Future   . COVID-19 Education: The signs and symptoms of COVID-19 were discussed with the patient and how to seek care for testing if needed. The importance of social distancing was discussed today. . Reviewed expectations re: course of current medical issues. . Discussed self-management of symptoms. . Outlined signs and symptoms indicating need for more acute intervention. . Patient verbalized understanding and all questions were answered. Marland Kitchen Health Maintenance issues including appropriate healthy diet, exercise, and smoking avoidance were discussed with patient. . See orders for this visit as documented in the electronic medical record.  Savannah Deutscher, DO  Records requested if needed. Time spent: 25 minutes, of which >50% was spent in obtaining information about her symptoms, reviewing her previous labs, evaluations, and treatments, counseling her about her condition (please see the discussed topics above), and developing a plan to further investigate it; she had a number of questions  which I addressed.

## 2019-05-06 ENCOUNTER — Encounter: Payer: Self-pay | Admitting: Family Medicine

## 2019-05-08 ENCOUNTER — Other Ambulatory Visit: Payer: Self-pay

## 2019-05-08 ENCOUNTER — Other Ambulatory Visit (INDEPENDENT_AMBULATORY_CARE_PROVIDER_SITE_OTHER): Payer: 59

## 2019-05-08 DIAGNOSIS — R739 Hyperglycemia, unspecified: Secondary | ICD-10-CM

## 2019-05-08 DIAGNOSIS — E7800 Pure hypercholesterolemia, unspecified: Secondary | ICD-10-CM | POA: Diagnosis not present

## 2019-05-08 DIAGNOSIS — R5382 Chronic fatigue, unspecified: Secondary | ICD-10-CM | POA: Diagnosis not present

## 2019-05-08 LAB — CBC WITH DIFFERENTIAL/PLATELET
Basophils Absolute: 0 10*3/uL (ref 0.0–0.1)
Basophils Relative: 0.5 % (ref 0.0–3.0)
Eosinophils Absolute: 0.1 10*3/uL (ref 0.0–0.7)
Eosinophils Relative: 1.6 % (ref 0.0–5.0)
HCT: 37.9 % (ref 36.0–46.0)
Hemoglobin: 13.2 g/dL (ref 12.0–15.0)
Lymphocytes Relative: 32 % (ref 12.0–46.0)
Lymphs Abs: 2.2 10*3/uL (ref 0.7–4.0)
MCHC: 34.8 g/dL (ref 30.0–36.0)
MCV: 87.2 fl (ref 78.0–100.0)
Monocytes Absolute: 0.4 10*3/uL (ref 0.1–1.0)
Monocytes Relative: 5.4 % (ref 3.0–12.0)
Neutro Abs: 4.2 10*3/uL (ref 1.4–7.7)
Neutrophils Relative %: 60.5 % (ref 43.0–77.0)
Platelets: 376 10*3/uL (ref 150.0–400.0)
RBC: 4.35 Mil/uL (ref 3.87–5.11)
RDW: 13 % (ref 11.5–15.5)
WBC: 6.9 10*3/uL (ref 4.0–10.5)

## 2019-05-08 LAB — COMPREHENSIVE METABOLIC PANEL
ALT: 15 U/L (ref 0–35)
AST: 13 U/L (ref 0–37)
Albumin: 4.5 g/dL (ref 3.5–5.2)
Alkaline Phosphatase: 79 U/L (ref 39–117)
BUN: 11 mg/dL (ref 6–23)
CO2: 26 mEq/L (ref 19–32)
Calcium: 9.3 mg/dL (ref 8.4–10.5)
Chloride: 103 mEq/L (ref 96–112)
Creatinine, Ser: 0.71 mg/dL (ref 0.40–1.20)
GFR: 91.49 mL/min (ref >60.00)
Glucose, Bld: 93 mg/dL (ref 70–99)
Potassium: 4.5 mEq/L (ref 3.5–5.1)
Sodium: 138 mEq/L (ref 135–145)
Total Bilirubin: 0.4 mg/dL (ref 0.2–1.2)
Total Protein: 7 g/dL (ref 6.0–8.3)

## 2019-05-08 LAB — LIPID PANEL
Cholesterol: 228 mg/dL — ABNORMAL HIGH (ref 0–200)
HDL: 49 mg/dL (ref >39.00)
LDL Cholesterol: 151 mg/dL — ABNORMAL HIGH (ref 0–99)
NonHDL: 178.53
Total CHOL/HDL Ratio: 5
Triglycerides: 138 mg/dL (ref 0.0–149.0)
VLDL: 27.6 mg/dL (ref 0.0–40.0)

## 2019-05-08 LAB — TSH: TSH: 1.84 u[IU]/mL (ref 0.35–4.50)

## 2019-05-08 LAB — HEMOGLOBIN A1C: Hgb A1c MFr Bld: 5.7 % (ref 4.6–6.5)

## 2019-05-19 ENCOUNTER — Ambulatory Visit: Payer: Self-pay

## 2019-05-19 NOTE — Telephone Encounter (Signed)
See note

## 2019-05-19 NOTE — Telephone Encounter (Signed)
Pt called to say that this weekend she had a 5 hour loss of time she labels as memory fog.  She states that she and Dr Juleen China have bee working on this problem since this fall.  She is concerned because in her fog she has miss placed her trazodone for sleep. She has been unable to find them.  She remembers picking them up at the pharmacy but nothing else. Pt states her symptoms are improved today. Care advice read to patient. Patient verbalized understanding. Call transferred to office for appointment scheduling  Reason for Disposition . [1] MODERATE weakness (i.e., interferes with work, school, normal activities) AND [2] persists > 3 days  Answer Assessment - Initial Assessment Questions 1. SYMPTOM: "What is the main symptom you are concerned about?" (e.g., weakness, numbness)     Memory fog 2. ONSET: "When did this start?" (minutes, hours, days; while sleeping)     Last fall 3. LAST NORMAL: "When was the last time you were normal (no symptoms)?"     This past weekend lost time for about 5 hours 4. PATTERN "Does this come and go, or has it been constant since it started?"  "Is it present now?"    Comes and goes 5. CARDIAC SYMPTOMS: "Have you had any of the following symptoms: chest pain, difficulty breathing, palpitations?"     Heavy fatigue memory fog 6. NEUROLOGIC SYMPTOMS: "Have you had any of the following symptoms: headache, dizziness, vision loss, double vision, changes in speech, unsteady on your feet?"     none 7. OTHER SYMPTOMS: "Do you have any other symptoms?"    fatigue 8. PREGNANCY: "Is there any chance you are pregnant?" "When was your last menstrual period?"   no  Answer Assessment - Initial Assessment Questions 1. DESCRIPTION: "Describe how you are feeling."     Very tired 2. SEVERITY: "How bad is it?"  "Can you stand and walk?"   - MILD - Feels weak or tired, but does not interfere with work, school or normal activities   - [place] to stand and walk; weakness  interferes with work, school, or normal activities   - SEVERE - Unable to stand or walk    severe 3. ONSET:  "When did the weakness begin?"     This weekend Memory fog 4. CAUSE: "What do you think is causing the weakness?"     unsure 5. MEDICINES: "Have you recently started a new medicine or had a change in the amount of a medicine?"     Lost trazidone 6. OTHER SYMPTOMS: "Do you have any other symptoms?" (e.g., chest pain, fever, cough, SOB, vomiting, diarrhea, bleeding, other areas of pain)     no 7. PREGNANCY: "Is there any chance you are pregnant?" "When was your last menstrual period?"     No  Protocols used: WEAKNESS (GENERALIZED) AND FATIGUE-A-AH, NEUROLOGIC DEFICIT-A-AH

## 2019-05-20 ENCOUNTER — Other Ambulatory Visit: Payer: Self-pay

## 2019-05-20 ENCOUNTER — Ambulatory Visit: Payer: 59 | Admitting: Physician Assistant

## 2019-05-20 ENCOUNTER — Encounter: Payer: Self-pay | Admitting: Family Medicine

## 2019-05-20 ENCOUNTER — Ambulatory Visit (INDEPENDENT_AMBULATORY_CARE_PROVIDER_SITE_OTHER): Payer: 59 | Admitting: Family Medicine

## 2019-05-20 VITALS — Ht 61.0 in | Wt 200.0 lb

## 2019-05-20 DIAGNOSIS — F4321 Adjustment disorder with depressed mood: Secondary | ICD-10-CM | POA: Diagnosis not present

## 2019-05-20 DIAGNOSIS — G479 Sleep disorder, unspecified: Secondary | ICD-10-CM | POA: Diagnosis not present

## 2019-05-20 DIAGNOSIS — G4733 Obstructive sleep apnea (adult) (pediatric): Secondary | ICD-10-CM | POA: Diagnosis not present

## 2019-05-20 DIAGNOSIS — R5382 Chronic fatigue, unspecified: Secondary | ICD-10-CM | POA: Diagnosis not present

## 2019-05-20 DIAGNOSIS — R413 Other amnesia: Secondary | ICD-10-CM

## 2019-05-20 MED ORDER — MODAFINIL 100 MG PO TABS: 100.0000 mg | ORAL_TABLET | Freq: Every day | ORAL | 0 refills | Status: DC

## 2019-05-20 MED ORDER — TRAZODONE HCL 50 MG PO TABS: ORAL_TABLET | ORAL | 3 refills | Status: DC

## 2019-05-20 NOTE — Progress Notes (Signed)
Virtual Visit via Video   Due to the COVID-19 pandemic, this visit was completed with telemedicine (audio/video) technology to reduce patient and provider exposure as well as to preserve personal protective equipment.   I connected with Savannah George by a video enabled telemedicine application and verified that I am speaking with the correct person using two identifiers. Location patient: Home Location provider: [facility], Office Persons participating in the virtual visit: Savannah George, Savannah Deutscher, DO Savannah George, CMA acting as scribe for Dr. Briscoe George.   I discussed the limitations of evaluation and management by telemedicine and the availability of in person appointments. The patient expressed understanding and agreed to proceed.  Care Team   Patient Care Team: Savannah Deutscher, DO as PCP - General (Family Medicine) Salvadore Dom, MD as Consulting Physician (Obstetrics and Gynecology) [facility] st as Physical Therapist (Physical Medicine and Rehabilitation)  Subjective:   HPI: Patient had issue over the weekend where she lost hours on Saturday. She went to pick up medications from [facility] and doesn't remember anything after that until later in the evening. She has no idea where her trazodone is. She has looked everywhere and is not sure where it has gone.   Patient called to say that this weekend she had a 5 hour loss of time she labels as memory fog.  She states that she and Savannah George have bee working on this problem since this fall.  She is concerned because in her fog she has misplaced her trazodone for sleep. She has been unable to find them.  She remembers picking them up at the pharmacy but nothing else. Patient states her symptoms are improved today. Care advice read to patient. Patient verbalized understanding. Call transferred to office for appointment scheduling.  Review of Systems  Constitutional: Negative for chills and fever.    HENT: Negative for hearing loss and tinnitus.   Eyes: Negative for blurred vision and double vision.  Respiratory: Negative for cough and hemoptysis.   Cardiovascular: Negative for chest pain and palpitations.  Gastrointestinal: Negative for nausea and vomiting.  Genitourinary: Negative for dysuria, frequency and urgency.  Musculoskeletal: Negative for myalgias and neck pain.  Skin: Negative for rash.  Neurological: Negative for dizziness and headaches.  Endo/Heme/Allergies: Does not bruise/bleed easily.  Psychiatric/Behavioral: Negative for depression and suicidal ideas.     Patient Active Problem List   Diagnosis Date Noted  . Chronic fatigue 02/04/2019  . Fibromyalgia 02/04/2019  . OSA, without need for CPAP 02/04/2019  . IUD, Mirena, placed 12/25/18 02/04/2019  . Urinary incontinence 11/06/2018  . Sleep disorder 10/25/2018  . Morbid obesity ([place]) 10/25/2018  . Situational depression 10/25/2018  . Amenorrhea 10/25/2018  . Family history of ovarian cancer 10/25/2018  . Pure hypercholesterolemia 10/25/2018  . Seasonal allergies 10/25/2018    Social History   Tobacco Use  . Smoking status: Never Smoker  . Smokeless tobacco: Never Used  Substance Use Topics  . Alcohol use: Yes    Alcohol/week: 0.0 - 3.0 standard drinks   Current Outpatient Medications:  .  Cholecalciferol (VITAMIN D PO), Take 2,000 Int'l Units by mouth., Disp: , Rfl:  .  levonorgestrel (MIRENA) 20 MCG/24HR IUD, 1 each by Intrauterine route once., Disp: , Rfl:  .  loratadine (CLARITIN) 10 MG tablet, Take 10 mg by mouth daily., Disp: , Rfl:  .  Multiple Vitamins-Minerals (MULTIVITAMIN PO), Take by mouth., Disp: , Rfl:  .  traZODone (DESYREL) 50 MG tablet, 1/2  to one tab daily, Disp: 30 tablet, Rfl: 3  Allergies  Allergen Reactions  . Aspirin Hives  . Latex Hives    Objective:   VITALS: Per patient if applicable, see vitals. GENERAL: Alert, appears well and in no acute distress. HEENT: Atraumatic,  conjunctiva clear, no obvious abnormalities on inspection of external nose and ears. NECK: Normal movements of the head and neck. CARDIOPULMONARY: No increased WOB. Speaking in clear sentences. I:E ratio WNL.  MS: Moves all visible extremities without noticeable abnormality. PSYCH: Pleasant and cooperative, well-groomed. Speech normal rate and rhythm. Affect is appropriate. Insight and judgement are appropriate. Attention is focused, linear, and appropriate.  NEURO: CN grossly intact. Oriented as arrived to appointment on time with no prompting. Moves both UE equally.  SKIN: No obvious lesions, wounds, erythema, or cyanosis noted on face or hands.  Depression screen [facility] 2/9 03/03/2019 02/03/2019  Decreased Interest 0 0  Down, Depressed, Hopeless 0 0  PHQ - 2 Score 0 0  Altered sleeping 1 2  Tired, decreased energy 1 1  Change in appetite 3 0  Feeling bad or failure about yourself  0 0  Trouble concentrating 1 1  Moving slowly or fidgety/restless 1 0  Suicidal thoughts 0 0  PHQ-9 Score 7 4  Difficult doing work/chores Somewhat difficult Not difficult at all    Assessment and Plan:   Savannah George was seen today for memory loss.  Diagnoses and all orders for this visit:  Chronic fatigue  Sleep disorder -     modafinil (PROVIGIL) 100 MG tablet; Take 1 tablet (100 mg total) by mouth daily.  OSA, without need for CPAP  Situational depression  Memory loss Will ask Neurology to evaluate again with new symptoms.   Marland Kitchen COVID-19 Education: The signs and symptoms of COVID-19 were discussed with the patient and how to seek care for testing if needed. The importance of social distancing was discussed today. . Reviewed expectations re: course of current medical issues. . Discussed self-management of symptoms. . Outlined signs and symptoms indicating need for more acute intervention. . Patient verbalized understanding and all questions were answered. Marland Kitchen Health Maintenance issues including  appropriate healthy diet, exercise, and smoking avoidance were discussed with patient. . See orders for this visit as documented in the electronic medical record.  Savannah Deutscher, DO  Records requested if needed. Time spent: 25 minutes, of which >50% was spent in obtaining information about her symptoms, reviewing her previous labs, evaluations, and treatments, counseling her about her condition (please see the discussed topics above), and developing a plan to further investigate it; she had a number of questions which I addressed.

## 2019-05-22 ENCOUNTER — Encounter: Payer: Self-pay | Admitting: Family Medicine

## 2019-05-22 ENCOUNTER — Telehealth: Payer: Self-pay | Admitting: Family Medicine

## 2019-05-22 NOTE — Telephone Encounter (Signed)
Copied from [place] [phone]. Topic: Quick Communication - Rx Refill/Question >> May 22, 2019  7:56 AM Rayann Heman wrote: Medication: pt called and states that a rx to replace generic adderall needed to be called in. Pt states that this was discusses at last appointment. Pt would like to know if she needs to take the original medication. Please advise

## 2019-05-22 NOTE — Telephone Encounter (Signed)
See note

## 2019-05-22 NOTE — Telephone Encounter (Signed)
Was not in call with you not sure what needed to be sent in?

## 2019-05-24 NOTE — Telephone Encounter (Signed)
I sent in Provigil. May need a prior authorization. Okay to continue Adderall until then.

## 2019-05-25 ENCOUNTER — Encounter: Payer: Self-pay | Admitting: Family Medicine

## 2019-05-25 NOTE — Telephone Encounter (Signed)
Called pt and left VM to call the office.  

## 2019-05-27 ENCOUNTER — Encounter: Payer: Self-pay | Admitting: Family Medicine

## 2019-07-01 ENCOUNTER — Other Ambulatory Visit: Payer: Self-pay

## 2019-07-01 ENCOUNTER — Ambulatory Visit: Payer: 59 | Admitting: Neurology

## 2019-07-01 ENCOUNTER — Encounter: Payer: Self-pay | Admitting: Neurology

## 2019-07-01 VITALS — BP 158/117 | HR 92 | Ht 60.0 in | Wt 197.0 lb

## 2019-07-01 DIAGNOSIS — F439 Reaction to severe stress, unspecified: Secondary | ICD-10-CM

## 2019-07-01 DIAGNOSIS — R413 Other amnesia: Secondary | ICD-10-CM

## 2019-07-01 DIAGNOSIS — F419 Anxiety disorder, unspecified: Secondary | ICD-10-CM | POA: Diagnosis not present

## 2019-07-01 DIAGNOSIS — F329 Major depressive disorder, single episode, unspecified: Secondary | ICD-10-CM

## 2019-07-01 DIAGNOSIS — G4710 Hypersomnia, unspecified: Secondary | ICD-10-CM

## 2019-07-01 DIAGNOSIS — R419 Unspecified symptoms and signs involving cognitive functions and awareness: Secondary | ICD-10-CM | POA: Diagnosis not present

## 2019-07-01 DIAGNOSIS — F32A Depression, unspecified: Secondary | ICD-10-CM

## 2019-07-01 NOTE — Patient Instructions (Signed)
You have complaints of memory loss: memory loss or changes in cognitive function can have many reasons and does not always mean you have dementia. Conditions that can contribute to subjective or objective memory loss include: depression, stress, poor sleep from insomnia or sleep apnea, dehydration, fluctuation in blood sugar values, thyroid or electrolyte dysfunction and certain vitamin deficiencies. Dementia can be caused by stroke, brain atherosclerosis or brain vascular disease due to vascular risk factors (smoking, high blood pressure, high cholesterol, obesity and uncontrolled diabetes), certain degenerative brain disorders (including Parkinson's disease and Multiple sclerosis) and by Alzheimer's disease or other, more rare and sometimes hereditary causes. We will do some additional testing: blood work (which has been done by your PCP, nothing to be added at this time) and we will do a brain scan. I will also request a formal cognitive evaluation, called neuropsychological evaluation which is done by a licensed neuropsychologist. We will make a referral in that regard. We will call you with brain scan test results and monitor your symptoms. Your memory loss is rather mild at this point, which, of course is reassuring.   We will look into your daytime sleepiness with a nighttime sleep study, followed by a daytime nap study. In preparation for sleep study testing, you will have to be off of the Provigil/modafinil and the trazodone. Please discuss with Dr. Juleen China, your PCP, whether it is okay for you to come off these meds, you have to be off of the meds at least for 2 weeks prior to sleep study testing.

## 2019-07-01 NOTE — Progress Notes (Signed)
Subjective:    Patient ID: Savannah George is a 38 y.o. female.  HPI     Savannah Age, MD, PhD [facility] [address], [address] [place], [place] [address]   Dear Dr. Juleen George,    I saw your patient, Savannah George, upon your kind request in the sleep clinic today for initial consultation of her cognitive complaints/memory loss.  The patient is unaccompanied today.  I have previously seen her last year for daytime somnolence.,  She had an interim home sleep test in January 2020 which showed borderline or very mild obstructive sleep apnea.  As you know, Savannah George is a 38 year old right-handed woman with an underlying medical history of migraines, history of DVT, depression, anxiety, obesity, fibromyalgia and anemia, who reports problems with her memory for the past 1-1/2 years.  She has noted more difficulty in the past 6 months or so.  She has noticed originally in late 2018 or early 2019 difficulty with focusing.  She had other symptoms at the time and was diagnosed with vitamin D deficiency, she had a 20 pound weight gain since she moved back to [place] in July 2018.  She has had thyroid issues but had it checked several times.  She had multiple blood tests over the past year and year and a half.  She has difficulty with short-term memory, has word finding difficulty, difficulty concentrating, difficulty with name recall and sequencing tasks.  She has noticed increase in stress and mood lability.  She is in fact quite tearful during the entire office visit and anxious.  She has in the past seen a psychologist and therapist but not recently and not since she moved back to [place].  She is currently not on any antidepressant or anxiety medication.  She has been taking low-dose trazodone at night for sleep and had been on generic Adderall until recently and is now on Provigil 100 mg daily.  She reports difficulty with sleep at night and daytime somnolence.  Her home  sleep test showed really borderline sleep apnea and she was offered to consider AutoPap therapy but it was not necessary and she decided against it.  She reports no family history of dementia, mother died 33 years ago at a younger George, 41 from kidney cancer.  Her father is 31 years old.  She reports that her 30 year old sister has had memory complaints and has a mood disorder including bipolar disease and suffers from insomnia as well. She has had recent blood work which I reviewed, on 05/08/2019 she had CBC with differential, CMP, A1c, lipid panel, TSH.  All of these were unremarkable.  In March 2020 she had BMP and magnesium, in February she had ESR, CRP, thyroid peroxidase antibodies, ANA and CBC with differential, all unremarkable, with the exception of slightly elevated platelet count, total cholesterol of 228 and LDL of 151.  In March 2019 she had a normal B12 level and she also had RPR and HIV checked last year. I reviewed your recent office notes including video based visit notes. Previously:  11/25/2018: 38 year old right-handed woman with an underlying medical history of migraines, DVT, depression, anxiety, anemia, and obesity, who reports worsening daytime tiredness and sleepiness over the past couple of years, as well as intermittent snoring.she does not wake up rested. She has had trouble maintaining sleep but typically no difficulty falling asleep. I reviewed your office note from 10/24/2018. Her Epworth sleepiness score is 11 out of 24 today, fatigue score is 46 out  of 108. She is single/divorced and lives with a roommate, she works at the [employer]. She has no children. She is a nonsmoker and drinks alcohol occasionally, about 2 times a week, caffeine in the form of coffee and soda, up to 2-3 servings per day, but not necessarily daily. Her father has sleep apnea and has a CPAP machine. Her ex-husband has sleep apnea and she is familiar with the diagnosis. She would be willing to  consider CPAP therapy if the need arises. She has been struggling with her weight. She has not been as active this past year. She moved from [place] about 15 months ago. She endorses increase in stress, lost her mother at George 42 about 2 years ago and also went through a divorce recently. She denies telltale symptoms versus leg syndrome. She is not aware of any leg twitching while asleep. She does tend to be a restless sleeper at times. She has woken up with a headache, up to 2 times per week, denies night to night nocturia. Her bedtime is generally between 9:30 and 10:30 PM, rise time around 6. She does not watch TV in her bedroom, she has 3 cats in her bedroom. When she has difficulty maintaining sleep and frequent nighttime awakenings, she tends to have more physical exhaustion, foggy headedness and difficulty with concentration.  Her Past Medical History Is Significant For: Past Medical History:  Diagnosis Date  . Abnormal Pap smear of cervix    George 55 or 64  . Anemia   . Anxiety   . Depression   . DVT (deep venous thrombosis) ([place])   . Migraines   . Urinary incontinence     Her Past Surgical History Is Significant For: Past Surgical History:  Procedure Laterality Date  . CRYOTHERAPY     For abnormal pap smear, George 33 or 17  . INTRAUTERINE DEVICE INSERTION     PARAGUARD    Her Family History Is Significant For: Family History  Problem Relation George of Onset  . Kidney cancer Mother   . Renal cancer Mother   . Diabetes Father   . Heart disease Father   . Stroke Father   . Uterine cancer Sister   . Diabetes Paternal Aunt   . Heart disease Maternal Grandfather   . Heart attack Maternal Grandfather   . Diabetes Paternal Grandmother   . Diabetes Paternal Grandfather   . Stroke Paternal Grandfather   . Heart attack Paternal Grandfather   . Ovarian cancer Sister     Her Social History Is Significant For: Social History   Socioeconomic History  . Marital status: Divorced     Spouse name: Not on file  . Number of children: Not on file  . Years of education: Not on file  . Highest education level: Master's degree (e.g., MA, MS, MEng, MEd, MSW, MBA)  Occupational History  . Occupation: Tree surgeon: [employer]  Social Needs  . Financial resource strain: Not on file  . Food insecurity    Worry: Not on file    Inability: Not on file  . Transportation needs    Medical: Not on file    Non-medical: Not on file  Tobacco Use  . Smoking status: Never Smoker  . Smokeless tobacco: Never Used  Substance and Sexual Activity  . Alcohol use: Yes    Alcohol/week: 0.0 - 3.0 standard drinks  . Drug use: Not Currently  . Sexual activity: Yes    Partners: Male,  Female    Birth control/protection: I.U.D.  Lifestyle  . Physical activity    Days per week: Not on file    Minutes per session: Not on file  . Stress: Not on file  Relationships  . Social Herbalist on phone: Not on file    Gets together: Not on file    Attends religious service: Not on file    Active member of club or organization: Not on file    Attends meetings of clubs or organizations: Not on file    Relationship status: Not on file  Other Topics Concern  . Not on file  Social History Narrative   Recently moved back to [place] after divorcing husband. Went to college here and still has friends here. Enjoys hiking. Trying to integrate herself back into the community.     Her Allergies Are:  Allergies  Allergen Reactions  . Aspirin Hives  . Latex Hives  :   Her Current Medications Are:  Outpatient Encounter Medications as of 07/01/2019  Medication Sig  . Cholecalciferol (VITAMIN D PO) Take 2,000 Int'l Units by mouth.  . levonorgestrel (MIRENA) 20 MCG/24HR IUD 1 each by Intrauterine route once.  . loratadine (CLARITIN) 10 MG tablet Take 10 mg by mouth daily.  . modafinil (PROVIGIL) 100 MG tablet Take 1 tablet (100 mg total) by mouth daily.  . Multiple  Vitamins-Minerals (MULTIVITAMIN PO) Take by mouth.  . traZODone (DESYREL) 50 MG tablet 1/2 to one tab daily   No facility-administered encounter medications on file as of 07/01/2019.   :  Review of Systems:  Out of a complete 14 point review of systems, all are reviewed and negative with the exception of these symptoms as listed below:      Review of Systems  Neurological:       Pt presents today to discuss her memory. Pt feels that her memory has worsened over the past year. She is also concerned about her sleep cycle.    Objective:  Neurological Exam  Physical Exam Physical Examination:   Vitals:   07/01/19 1003  BP: (!) 158/117  Pulse: 92    General Examination: The patient is a very pleasant 38 y.o. female in no acute distress, But she is tearful and started crying during the memory test as well as during the examination, she is tearful, appears anxious is jittery and fidgety with her fingers.   HEENT: Normocephalic, atraumatic, pupils are equal, round and reactive to light and accommodation. Corrective eye glasses in place. Extraocular tracking is good without limitation to gaze excursion or nystagmus noted. Normal smooth pursuit is noted. Hearing is grossly intact. Face is symmetric with normal facial animation and normal facial sensation. Speech is clear with no dysarthria noted. There is no hypophonia. There is no lip, neck/head, jaw or voice tremor. Neck is supple with full range of passive and active motion. There are no carotid bruits on auscultation. Oropharynx exam reveals: mild to moderate mouth dryness, adequate dental hygiene and mild airway crowding. Tongue protrudes centrally and palate elevates symmetrically.   Chest: Clear to auscultation without wheezing, rhonchi or crackles noted.  Heart: S1+S2+0, regular and normal without murmurs, rubs or gallops noted.   Abdomen: Soft, non-tender and non-distended with normal bowel sounds appreciated on  auscultation.  Extremities: There is no pitting edema in the distal lower extremities bilaterally.   Skin: Warm and dry without trophic changes noted.  Musculoskeletal: exam reveals no obvious joint deformities, tenderness or joint swelling  or erythema.   Neurologically:  Mental status: The patient is awake, alert and oriented in all 4 spheres. Her immediate and remote memory, attention, language skills and fund of knowledge are appropriate. There is no evidence of aphasia, agnosia, apraxia or anomia. Speech is clear with normal prosody and enunciation. Thought process is linear. Mood is labile, affect blunted.    On 07/01/2019: MMSE: 28/30, CDT: 4/4, AFT: 10/min.  Cranial nerves II - XII are as described above under HEENT exam.  Motor exam: Normal bulk, strength and tone is noted. There is no drift, tremor or rebound. Romberg is negative. Reflexes are 3+. Toes downgoing. Fine motor skills and coordination: grossly intact, cerebellar testing: No dysmetria or intention tremor. Normal FTN and HTS. No truncal or gait ataxia.  Sensory exam: intact to light touch.  Gait, station and balance: She stands easily. No veering to one side is noted. No leaning to one side is noted. Posture is George-appropriate and stance is narrow based. Gait shows normal stride length and normal pace. No problems turning are noted. Tandem walk is unremarkable.       Assessment and plan:  In summary, Margaux Engen is a very pleasant 38 year old female with an underlying medical history of migraines, DVT, depression, anxiety, anemia, and obesity, who Presents for evaluation of her memory complaints, she reports an at least 1-1/2-year history of difficulty with name recall, word finding difficulty, difficulty sequencing tasks. She missed 2 points on the MMSE today, one-point for Dr. and one-point on the serial sevens.  Her neurological exam is nonfocal and she is reassured.  She does not give a telltale history of dementia.   She does appear to be quite tearful/depressed and anxious today and admits to having increase in stress and anxiety.  She had in the past successful counseling but not since she moved back to the [place] area.  She would be amenable to seeking further help for depression and anxiety management and consider counseling.  I think suboptimally treated mood disorder is a contributor.  Nevertheless, we can certainly try to rule out an underlying organic problem.  I would like to proceed with further testing in the form of brain MRI with and without contrast, neuropsychological evaluation with the help of a trained neuropsychologist and also pursue additional sleep study testing to rule out an underlying organic sleepiness disorder.  In preparation for her sleep studies she is advised to talk to you about coming off her Provigil and trazodone or any other stimulant, wake promoting or sedating medications potentially.  She is currently not on any antidepressant medication.  She would be willing to come off of her medications but is encouraged to discuss this with you first.  I am planning to see her back after these studies are completed.  In the interim, she is encouraged to talk to about help with mood disorder management/counseling.   Thank you very much for allowing me to participate in the care of this nice patient. If I can be of any further assistance to you please do not hesitate to call me at [phone].  Sincerely,   Savannah Age, MD, PhD

## 2019-07-02 ENCOUNTER — Telehealth: Payer: Self-pay | Admitting: Neurology

## 2019-07-02 ENCOUNTER — Encounter: Payer: Self-pay | Admitting: Family Medicine

## 2019-07-02 DIAGNOSIS — F40240 Claustrophobia: Secondary | ICD-10-CM

## 2019-07-02 MED ORDER — ALPRAZOLAM 0.5 MG PO TABS: ORAL_TABLET | ORAL | 0 refills | Status: DC

## 2019-07-02 NOTE — Addendum Note (Signed)
Addended by: Star Age on: 07/02/2019 10:49 AM   Modules accepted: Orders

## 2019-07-02 NOTE — Telephone Encounter (Signed)
I called pt, left a detailed message per DPR, advising of her the xanax instructions and that she must have a driver to and from the MRI appt. I asked her to call me back with any questions or concerns.

## 2019-07-02 NOTE — Telephone Encounter (Signed)
I have ordered Xanax for patient's upcoming MRI due to anxiety/claustrophobia reported. Please inform patient or caregiver and remind them, that she should not drive after taking Xanax and have someone take her to and from the MRI appointment.

## 2019-07-02 NOTE — Telephone Encounter (Signed)
no to the covid-19 questions MR Brain w/wo contrast Dr. Rexene Alberts [facility] Auth: [place] via [facility] website. Patient is scheduled at [facility] for 07/08/19.  Patient also informed me she is claustrophobic and will need something to help her. She is aware to have a driver.

## 2019-07-08 ENCOUNTER — Ambulatory Visit: Payer: 59

## 2019-07-08 ENCOUNTER — Other Ambulatory Visit: Payer: Self-pay

## 2019-07-08 DIAGNOSIS — R419 Unspecified symptoms and signs involving cognitive functions and awareness: Secondary | ICD-10-CM | POA: Diagnosis not present

## 2019-07-08 DIAGNOSIS — F439 Reaction to severe stress, unspecified: Secondary | ICD-10-CM | POA: Diagnosis not present

## 2019-07-08 DIAGNOSIS — F329 Major depressive disorder, single episode, unspecified: Secondary | ICD-10-CM

## 2019-07-08 DIAGNOSIS — F419 Anxiety disorder, unspecified: Secondary | ICD-10-CM

## 2019-07-08 DIAGNOSIS — F32A Depression, unspecified: Secondary | ICD-10-CM

## 2019-07-08 DIAGNOSIS — R413 Other amnesia: Secondary | ICD-10-CM | POA: Diagnosis not present

## 2019-07-08 MED ORDER — GADOBENATE DIMEGLUMINE 529 MG/ML IV SOLN
20.0000 mL | Freq: Once | INTRAVENOUS | Status: AC | PRN
  Administered 2019-07-08: 20 mL via INTRAVENOUS

## 2019-07-13 ENCOUNTER — Telehealth: Payer: Self-pay

## 2019-07-13 NOTE — Progress Notes (Signed)
MRI brain was reported as normal w/wo contrast, please update pt. We will proceed, as discussed, with sleep studies and neuropsychological evaluation. Savannah George

## 2019-07-13 NOTE — Telephone Encounter (Signed)
-----   Message from Star Age, MD sent at 07/13/2019  8:10 AM EDT ----- MRI brain was reported as normal w/wo contrast, please update pt. We will proceed, as discussed, with sleep studies and neuropsychological evaluation. Michel Bickers

## 2019-07-13 NOTE — Telephone Encounter (Signed)
I called pt, left a detailed message on her cell phone with MRI results and recommendations. I asked pt to call us back with any questions or concerns.

## 2019-07-21 ENCOUNTER — Encounter: Payer: Self-pay | Admitting: Family Medicine

## 2019-08-03 NOTE — Telephone Encounter (Signed)
Copied from [place] [id number]. Topic: General - Other >> Aug 03, 2019  3:31 PM Leward Quan A wrote: Reason for CRM: Patient called to inform Dr Juleen China that she have been approved for a sleep study so [facility] ask that she is weaned off her medications by 08/17/2019 so that she can be tested on 08/31/2019/& 09/01/2019 please call patient at Ph# [phone]

## 2019-08-14 ENCOUNTER — Encounter: Payer: Self-pay | Admitting: Psychology

## 2019-08-18 ENCOUNTER — Telehealth: Payer: Self-pay

## 2019-08-18 NOTE — Telephone Encounter (Signed)
Received FMLA forms from patient. We have not seen in last few months. Have called and l/m to call office to see why

## 2019-08-19 ENCOUNTER — Other Ambulatory Visit: Payer: Self-pay

## 2019-08-19 ENCOUNTER — Encounter: Payer: Self-pay | Admitting: Family Medicine

## 2019-08-19 ENCOUNTER — Ambulatory Visit (INDEPENDENT_AMBULATORY_CARE_PROVIDER_SITE_OTHER): Payer: 59 | Admitting: Family Medicine

## 2019-08-19 VITALS — Ht 60.0 in | Wt 197.0 lb

## 2019-08-19 DIAGNOSIS — R5382 Chronic fatigue, unspecified: Secondary | ICD-10-CM

## 2019-08-19 DIAGNOSIS — M797 Fibromyalgia: Secondary | ICD-10-CM

## 2019-08-19 DIAGNOSIS — F4321 Adjustment disorder with depressed mood: Secondary | ICD-10-CM | POA: Diagnosis not present

## 2019-08-19 DIAGNOSIS — G479 Sleep disorder, unspecified: Secondary | ICD-10-CM

## 2019-08-19 NOTE — Progress Notes (Signed)
Virtual Visit via Video   Due to the COVID-19 pandemic, this visit was completed with telemedicine (audio/video) technology to reduce patient and provider exposure as well as to preserve personal protective equipment.   I connected with Gar Ponto by a video enabled telemedicine application and verified that I am speaking with the correct person using two identifiers. Location patient: Home Location provider: [facility], Office Persons participating in the virtual visit: Peggie Shines, Briscoe Deutscher, DO Lonell Grandchild, CMA acting as scribe for Dr. Briscoe Deutscher.   I discussed the limitations of evaluation and management by telemedicine and the availability of in person appointments. The patient expressed understanding and agreed to proceed.  Care Team   Patient Care Team: Briscoe Deutscher, DO as PCP - General (Family Medicine) Salvadore Dom, MD as Consulting Physician (Obstetrics and Gynecology) [address] st as Physical Therapist (Physical Medicine and Rehabilitation)  Subjective:   HPI: Patient in for review of symptoms in order to have FMLA forms filled out. Hx of fibromyalgia, sleep disorder, chronic fatigue, depression/anxiety. 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 Symptoms of fatigue, brain fog, time lapse, memory issues worsened during the spring due to no real schedule, high stress, father living with her for a month. Now improved with schedule, work boundaries.   Review of Systems  Constitutional: Negative for chills and fever.  HENT: Negative for hearing loss and tinnitus.   Eyes: Negative for blurred vision and double vision.  Respiratory: Negative for cough and wheezing.   Cardiovascular: Negative for chest pain, palpitations and leg swelling.  Gastrointestinal: Negative for nausea and vomiting.  Genitourinary: Negative for dysuria and urgency.  Neurological: Negative for dizziness and headaches.  Psychiatric/Behavioral: Negative for  depression and suicidal ideas.    Patient Active Problem List   Diagnosis Date Noted  . Chronic fatigue 02/04/2019  . Fibromyalgia 02/04/2019  . OSA, without need for CPAP 02/04/2019  . IUD, Mirena, placed 12/25/18 02/04/2019  . Urinary incontinence 11/06/2018  . Sleep disorder 10/25/2018  . Morbid obesity ([place]) 10/25/2018  . Situational depression 10/25/2018  . Amenorrhea 10/25/2018  . Family history of ovarian cancer 10/25/2018  . Pure hypercholesterolemia 10/25/2018  . Seasonal allergies 10/25/2018    Social History   Tobacco Use  . Smoking status: Never Smoker  . Smokeless tobacco: Never Used  Substance Use Topics  . Alcohol use: Yes    Alcohol/week: 0.0 - 3.0 standard drinks    Current Outpatient Medications:  .  Cholecalciferol (VITAMIN D PO), Take 2,000 Int'l Units by mouth., Disp: , Rfl:  .  levonorgestrel (MIRENA) 20 MCG/24HR IUD, 1 each by Intrauterine route once., Disp: , Rfl:  .  modafinil (PROVIGIL) 100 MG tablet, Take 1 tablet (100 mg total) by mouth daily., Disp: 30 tablet, Rfl: 0 .  Multiple Vitamins-Minerals (MULTIVITAMIN PO), Take by mouth., Disp: , Rfl:  .  traZODone (DESYREL) 50 MG tablet, 1/2 to one tab daily, Disp: 30 tablet, Rfl: 3  Allergies  Allergen Reactions  . Aspirin Hives  . Latex Hives    Objective:   VITALS: Per patient if applicable, see vitals. GENERAL: Alert, appears well and in no acute distress. HEENT: Atraumatic, conjunctiva clear, no obvious abnormalities on inspection of external nose and ears. NECK: Normal movements of the head and neck. CARDIOPULMONARY: No increased WOB. Speaking in clear sentences. I:E ratio WNL.  MS: Moves all visible extremities without noticeable abnormality. PSYCH: Pleasant and cooperative, well-groomed. Speech normal rate and rhythm. Affect is appropriate.  Insight and judgement are appropriate. Attention is focused, linear, and appropriate.  NEURO: CN grossly intact. Oriented as arrived to appointment on  time with no prompting. Moves both UE equally.  SKIN: No obvious lesions, wounds, erythema, or cyanosis noted on face or hands.  Depression screen [facility] 2/9 08/19/2019 03/03/2019 02/03/2019  Decreased Interest 1 0 0  Down, Depressed, Hopeless 1 0 0  PHQ - 2 Score 2 0 0  Altered sleeping 3 1 2   Tired, decreased energy 3 1 1   Change in appetite 1 3 0  Feeling bad or failure about yourself  0 0 0  Trouble concentrating 2 1 1   Moving slowly or fidgety/restless 2 1 0  Suicidal thoughts 0 0 0  PHQ-9 Score 13 7 4   Difficult doing work/chores - Somewhat difficult Not difficult at all    Assessment and Plan:   Baylynn was seen today for follow-up.  Diagnoses and all orders for this visit:  Chronic fatigue  Fibromyalgia  Sleep disorder  Situational depression    . COVID-19 Education: The signs and symptoms of COVID-19 were discussed with the patient and how to seek care for testing if needed. The importance of social distancing was discussed today. . Reviewed expectations re: course of current medical issues. . Discussed self-management of symptoms. . Outlined signs and symptoms indicating need for more acute intervention. . Patient verbalized understanding and all questions were answered. Marland Kitchen Health Maintenance issues including appropriate healthy diet, exercise, and smoking avoidance were discussed with patient. . See orders for this visit as documented in the electronic medical record.  Briscoe Deutscher, DO  Records requested if needed. Time spent: 25 minutes, of which >50% was spent in obtaining information about her symptoms, reviewing her previous labs, evaluations, and treatments, counseling her about her condition (please see the discussed topics above), and developing a plan to further investigate it; she had a number of questions which I addressed.

## 2019-08-24 ENCOUNTER — Ambulatory Visit (INDEPENDENT_AMBULATORY_CARE_PROVIDER_SITE_OTHER): Payer: 59 | Admitting: Neurology

## 2019-08-24 DIAGNOSIS — G4733 Obstructive sleep apnea (adult) (pediatric): Secondary | ICD-10-CM | POA: Diagnosis not present

## 2019-08-24 DIAGNOSIS — R419 Unspecified symptoms and signs involving cognitive functions and awareness: Secondary | ICD-10-CM | POA: Diagnosis not present

## 2019-08-24 DIAGNOSIS — F439 Reaction to severe stress, unspecified: Secondary | ICD-10-CM

## 2019-08-24 DIAGNOSIS — F419 Anxiety disorder, unspecified: Secondary | ICD-10-CM

## 2019-08-24 DIAGNOSIS — G4720 Circadian rhythm sleep disorder, unspecified type: Secondary | ICD-10-CM

## 2019-08-24 DIAGNOSIS — F329 Major depressive disorder, single episode, unspecified: Secondary | ICD-10-CM

## 2019-08-24 DIAGNOSIS — R413 Other amnesia: Secondary | ICD-10-CM

## 2019-08-24 DIAGNOSIS — F32A Depression, unspecified: Secondary | ICD-10-CM

## 2019-08-24 DIAGNOSIS — G4710 Hypersomnia, unspecified: Secondary | ICD-10-CM

## 2019-08-27 ENCOUNTER — Encounter: Payer: Self-pay | Admitting: Neurology

## 2019-08-27 ENCOUNTER — Encounter: Payer: Self-pay | Admitting: Family Medicine

## 2019-08-27 NOTE — Addendum Note (Signed)
Addended by: Star Age on: 08/27/2019 07:41 PM   Modules accepted: Orders

## 2019-08-27 NOTE — Procedures (Signed)
PATIENT'S NAME:  Savannah George, Savannah George DOB:      24-Jan-1981      MR#:    [id number]     DATE OF RECORDING: 08/24/2019 REFERRING M.D.:  Briscoe Deutscher, DO  Study Performed:   Baseline Polysomnogram HISTORY: 38 year old woman with a history of migraines, history of DVT, depression, anxiety, obesity, fibromyalgia and anemia, who reports daytime sleepiness and problems with her memory for the past 1-1/2 years. The patient endorsed the Epworth Sleepiness Scale at 11 points. The patient's weight 197 pounds with a height of 60 (inches), resulting in a BMI of 38.5 kg/m2. The patient's neck circumference measured 14 inches. The patient was scheduled for a PSG and next day MSLT, but the nap study was canceled, due to OSA detected.   CURRENT MEDICATIONS: Mirena, Claritin, Provigil, Desyrel   PROCEDURE:  This is a multichannel digital polysomnogram utilizing the Somnostar 11.2 system.  Electrodes and sensors were applied and monitored per AASM Specifications.   EEG, EOG, Chin and Limb EMG, were sampled at 200 Hz.  ECG, Snore and Nasal Pressure, Thermal Airflow, Respiratory Effort, CPAP Flow and Pressure, Oximetry was sampled at 50 Hz. Digital video and audio were recorded.      BASELINE STUDY  Lights Out was at 21:40 and Lights On at 05:00.  Total recording time (TRT) was 440.5 minutes, with a total sleep time (TST) of 397 minutes.   The patient's sleep latency was 12.5 minutes. REM latency was 63.5 minutes, which is slightly reduced. The sleep efficiency was 90.1 %.     SLEEP ARCHITECTURE: WASO (Wake after sleep onset) was 38 minutes with mild to moderate sleep fragmentation noted. There were 38.5 minutes in Stage N1, 132.5 minutes Stage N2, 137 minutes Stage N3 and 89 minutes in Stage REM.  The percentage of Stage N1 was 9.7%, which is increased, Stage N2 was 33.4%, Stage N3 was 34.5%, which is increased, and Stage R (REM sleep) was 22.4%, which is normal.  RESPIRATORY ANALYSIS:  There were a total of 82 respiratory  events:  0 obstructive apneas, 3 central apneas and 6 mixed apneas with a total of 9 apneas and an apnea index (AI) of 1.4 /hour. There were 73 hypopneas with a hypopnea index of 11. /hour. The patient also had 0 respiratory event related arousals (RERAs).      The total APNEA/HYPOPNEA INDEX (AHI) was 12.4 /hour and the total RESPIRATORY DISTURBANCE INDEX was 0. 12.4 /hour.  75 events occurred in REM sleep and 13 events in NREM. The REM AHI was 50.6 /hour, versus a non-REM AHI of 1.4. The patient spent 375.5 minutes of total sleep time in the supine position and 22 minutes in non-supine.. The supine AHI was 11.5 versus a non-supine AHI of 27.9.  OXYGEN SATURATION & C02:  The Wake baseline 02 saturation was 98%, with the lowest being 82%. Time spent below 89% saturation equaled 23 minutes.  PERIODIC LIMB MOVEMENTS: The patient had a total of 0 Periodic Limb Movements.  The Periodic Limb Movement (PLM) index was 0 and the PLM Arousal index was 0/hour. The arousals were noted as: 87 were spontaneous, 0 were associated with PLMs, 23 were associated with respiratory events.  Audio and video analysis did not show any abnormal or unusual movements, behaviors, phonations or vocalizations. The patient took no bathroom breaks. Snoring was noted, ranging from mild to loud. The EKG was in keeping with normal sinus rhythm (NSR).  Post-study, the patient indicated that sleep was the same as usual.  IMPRESSION:  1. Obstructive Sleep Apnea (OSA) 2. Dysfunctions associated with sleep stages or arousal from sleep  RECOMMENDATIONS:  1. This study demonstrates overall mild obstructive sleep apnea, severe in REM sleep with a total AHI of 12.4/hour, REM AHI of 50.6/hour, and O2 nadir of 82%. Given the patient's medical history and sleep related complaints, treatment with positive airway pressure is advised; a full-night CPAP titration study is recommended to optimize therapy. Other treatment options may include  avoidance of supine sleep position along with weight loss, upper airway or jaw surgery in selected patients or the use of an oral appliance in certain patients. ENT evaluation and/or consultation with a maxillofacial surgeon or dentist may be feasible in some instances.    2. This study shows sleep fragmentation and abnormal sleep stage percentages; these are nonspecific findings and per se do not signify an intrinsic sleep disorder or a cause for the patient's sleep-related symptoms. Causes include (but are not limited to) the first night effect of the sleep study, circadian rhythm disturbances, medication effect or an underlying mood disorder or medical problem.  3. The patient should be cautioned not to drive, work at heights, or operate dangerous or heavy equipment when tired or sleepy. Review and reiteration of good sleep hygiene measures should be pursued with any patient. 4. The patient will be seen in follow-up in the sleep clinic at [facility] for discussion of the test results, symptom and treatment compliance review, further management strategies, etc. The referring provider will be notified of the test results.  I certify that I have reviewed the entire raw data recording prior to the issuance of this report in accordance with the Standards of Accreditation of the American Academy of Sleep Medicine (AASM)     Star Age, MD, PhD Diplomat, American Board of Neurology and Sleep Medicine (Neurology and Sleep Medicine)

## 2019-08-27 NOTE — Progress Notes (Signed)
Patient referred by Dr. Juleen China for cognitive complaints and EDS, seen by me on 07/01/19, had prior HST with borderline OSA (no PAP therapy). I suggested PSG with next day MSLT, had diagnostic PSG on 08/24/19 and we canceled MSLT.   Please call and notify the patient that the recent sleep study showed mild obstructive sleep apnea, severe in REM sleep. I recommend treatment for this in the form of CPAP. This will require, ideally, a repeat sleep study for proper titration and mask fitting and correct monitoring of the oxygen saturations. Please explain to patient. I have placed an order in the chart. Thanks.  Star Age, MD, PhD [facility])

## 2019-09-01 ENCOUNTER — Encounter: Payer: Self-pay | Admitting: Family Medicine

## 2019-09-01 ENCOUNTER — Encounter: Payer: Self-pay | Admitting: Neurology

## 2019-09-01 ENCOUNTER — Telehealth: Payer: Self-pay | Admitting: Neurology

## 2019-09-01 NOTE — Telephone Encounter (Signed)
-----   Message from Star Age, MD sent at 08/27/2019  7:41 PM EDT ----- Patient referred by Dr. Juleen China for cognitive complaints and EDS, seen by me on 07/01/19, had prior HST with borderline OSA (no PAP therapy). I suggested PSG with next day MSLT, had diagnostic PSG on 08/24/19 and we canceled MSLT.   Please call and notify the patient that the recent sleep study showed mild obstructive sleep apnea, severe in REM sleep. I recommend treatment for this in the form of CPAP. This will require, ideally, a repeat sleep study for proper titration and mask fitting and correct monitoring of the oxygen saturations. Please explain to patient. I have placed an order in the chart. Thanks.  Star Age, MD, PhD [facility])

## 2019-09-01 NOTE — Telephone Encounter (Signed)
Called patient to discuss sleep study results. No answer at this time. LVM for the patient to call back. I will also send a mychart message.   

## 2019-09-01 NOTE — Telephone Encounter (Signed)
Pt returned call. I advised pt that Dr. Rexene Alberts reviewed their sleep study results and found that pt had sleep apnea and recommends that pt be treated with a cpap. Dr. Rexene Alberts recommends that pt return for a repeat sleep study in order to properly titrate the cpap and ensure a good mask fit. Pt is agreeable to returning for a titration study. I advised pt that our sleep lab will file with pt's insurance and call pt to schedule the sleep study when we hear back from the pt's insurance regarding coverage of this sleep study. Pt verbalized understanding of results. Pt had no questions at this time but was encouraged to call back if questions arise. Reviewed the patient's sleep study with her in detail.

## 2019-09-03 ENCOUNTER — Other Ambulatory Visit: Payer: Self-pay

## 2019-09-03 DIAGNOSIS — F4321 Adjustment disorder with depressed mood: Secondary | ICD-10-CM

## 2019-09-07 ENCOUNTER — Telehealth: Payer: Self-pay

## 2019-09-07 ENCOUNTER — Telehealth: Payer: Self-pay | Admitting: Obstetrics and Gynecology

## 2019-09-07 DIAGNOSIS — G4733 Obstructive sleep apnea (adult) (pediatric): Secondary | ICD-10-CM

## 2019-09-07 NOTE — Telephone Encounter (Signed)
Insurance has denied the in lab CPAP titration study. They have recommended AutoCPAP. Thanks.

## 2019-09-07 NOTE — Telephone Encounter (Signed)
Call to patient. Patient states that she noticed the vaginal discharge about a week ago. Patient states the discharge started out faint, but has gotten darker each day. Complaining of itching and odor. Denies pelvic pain or fever. No recent change in sexual partner. Patient states she does not currently have cycles with her IUD. OV recommended. Patient scheduled for 09-08-2019 at 1400. Patient agreeable to date and time of appointment. Covid prescreening negative.   Routing to provider and will close encounter.

## 2019-09-07 NOTE — Addendum Note (Signed)
Addended by: Star Age on: 09/07/2019 10:04 AM   Modules accepted: Orders

## 2019-09-07 NOTE — Telephone Encounter (Signed)
Patient is calling regarding reoccurring BV symptoms. Patient stated that she is experiencing itching and "blueish green" discharge.

## 2019-09-07 NOTE — Telephone Encounter (Signed)
We will set patient up with autoPAP at home, as insurance denied in house titration study for OSA. Pls process order and notify patient and set up FU in 10 weeks with me or NP.     

## 2019-09-08 ENCOUNTER — Ambulatory Visit: Payer: 59 | Admitting: Obstetrics and Gynecology

## 2019-09-08 ENCOUNTER — Other Ambulatory Visit: Payer: Self-pay

## 2019-09-08 ENCOUNTER — Encounter: Payer: Self-pay | Admitting: Obstetrics and Gynecology

## 2019-09-08 VITALS — BP 108/60 | HR 88 | Temp 97.3°F | Wt 196.6 lb

## 2019-09-08 DIAGNOSIS — N760 Acute vaginitis: Secondary | ICD-10-CM

## 2019-09-08 DIAGNOSIS — F418 Other specified anxiety disorders: Secondary | ICD-10-CM | POA: Diagnosis not present

## 2019-09-08 DIAGNOSIS — B9689 Other specified bacterial agents as the cause of diseases classified elsewhere: Secondary | ICD-10-CM | POA: Diagnosis not present

## 2019-09-08 MED ORDER — METRONIDAZOLE 500 MG PO TABS: 500.0000 mg | ORAL_TABLET | Freq: Two times a day (BID) | ORAL | 0 refills | Status: DC

## 2019-09-08 NOTE — Telephone Encounter (Signed)
I called pt to discuss. No answer, left a message asking her to call me back. 

## 2019-09-08 NOTE — Telephone Encounter (Signed)
I called pt. Dr. Rexene Alberts recommends that pt start an auto pap at home since her insurance denied her in house titration study. I reviewed PAP compliance expectations with the pt. Pt is agreeable to starting an auto-PAP. I advised pt that an order will be sent to a DME, [facility], and [facility] will call the pt within about one week after they file with the pt's insurance. [facility] will show the pt how to use the machine, fit for masks, and troubleshoot the auto-PAP if needed. A follow up appt was made for insurance purposes with Amy, NP on 11/12/19 at 8:00am. Pt verbalized understanding to arrive 15 minutes early and bring their auto-PAP. A letter with all of this information in it will be sent to the pt's mychart as a reminder. I verified with the pt that the address we have on file is correct. Pt verbalized understanding of results. Pt had no questions at this time but was encouraged to call back if questions arise. I have sent the order to [facility] and have received confirmation that they have received the order.

## 2019-09-08 NOTE — Progress Notes (Signed)
GYNECOLOGY  VISIT   HPI: 38 y.o.   Divorced White or Caucasian Not Hispanic or Latino  female   [id number] with No LMP recorded. (Menstrual status: IUD).   here for vaginal itching, odor, and discharge that started 1 week ago. The d/c is yellow/white with a blue/green hue, thin. Odor is worse than the itching. The itching is mild.  Not sexually active currently, no STD concerns.  She is struggling with covid. She is still working as a Licensed conveyancer. She is anxious about getting Covid. She is fairly isolated.  She has spoken with Dr Juleen China about depression. No thoughts of hurting herself or others. She has been in a mental fog, not sleeping well, fatigue, missing work. She has an appointment to see a Neuropsychologist in 12/20. She is being set up for a sleep study.   GYNECOLOGIC HISTORY: No LMP recorded. (Menstrual status: IUD). Contraception: IUD, mirena placed in 1/20 Menopausal hormone therapy: None        OB History    Gravida  0   Para  0   Term  0   Preterm  0   AB  0   Living  0     SAB  0   TAB  0   Ectopic  0   Multiple  0   Live Births  0              Patient Active Problem List   Diagnosis Date Noted  . Chronic fatigue 02/04/2019  . Fibromyalgia 02/04/2019  . OSA, without need for CPAP 02/04/2019  . IUD, Mirena, placed 12/25/18 02/04/2019  . Urinary incontinence 11/06/2018  . Sleep disorder 10/25/2018  . Morbid obesity ([place]) 10/25/2018  . Situational depression 10/25/2018  . Amenorrhea 10/25/2018  . Family history of ovarian cancer 10/25/2018  . Pure hypercholesterolemia 10/25/2018  . Seasonal allergies 10/25/2018    Past Medical History:  Diagnosis Date  . Abnormal Pap smear of cervix    age 39 or 43  . Anemia   . Anxiety   . Depression   . DVT (deep venous thrombosis) ([place])   . Migraines   . Urinary incontinence     Past Surgical History:  Procedure Laterality Date  . CRYOTHERAPY     For abnormal pap smear, age 57 or 37  .  INTRAUTERINE DEVICE INSERTION     PARAGUARD    Current Outpatient Medications  Medication Sig Dispense Refill  . Cholecalciferol (VITAMIN D PO) Take 2,000 Int'l Units by mouth.    . levonorgestrel (MIRENA) 20 MCG/24HR IUD 1 each by Intrauterine route once.    . modafinil (PROVIGIL) 100 MG tablet Take 1 tablet (100 mg total) by mouth daily. 30 tablet 0  . Multiple Vitamins-Minerals (MULTIVITAMIN PO) Take by mouth.    . traZODone (DESYREL) 50 MG tablet 1/2 to one tab daily 30 tablet 3   No current facility-administered medications for this visit.      ALLERGIES: Aspirin and Latex  Family History  Problem Relation Age of Onset  . Kidney cancer Mother   . Renal cancer Mother   . Diabetes Father   . Heart disease Father   . Stroke Father   . Uterine cancer Sister   . Diabetes Paternal Aunt   . Heart disease Maternal Grandfather   . Heart attack Maternal Grandfather   . Diabetes Paternal Grandmother   . Diabetes Paternal Grandfather   . Stroke Paternal Grandfather   . Heart attack Paternal Grandfather   .  Ovarian cancer Sister     Social History   Socioeconomic History  . Marital status: Divorced    Spouse name: Not on file  . Number of children: Not on file  . Years of education: Not on file  . Highest education level: Master's degree (e.g., MA, MS, MEng, MEd, MSW, MBA)  Occupational History  . Occupation: Tree surgeon: [employer]  Social Needs  . Financial resource strain: Not on file  . Food insecurity    Worry: Not on file    Inability: Not on file  . Transportation needs    Medical: Not on file    Non-medical: Not on file  Tobacco Use  . Smoking status: Never Smoker  . Smokeless tobacco: Never Used  Substance and Sexual Activity  . Alcohol use: Yes    Alcohol/week: 0.0 - 3.0 standard drinks  . Drug use: Not Currently  . Sexual activity: Not Currently    Partners: Male, Female    Birth control/protection: I.U.D.  Lifestyle  .  Physical activity    Days per week: Not on file    Minutes per session: Not on file  . Stress: Not on file  Relationships  . Social Herbalist on phone: Not on file    Gets together: Not on file    Attends religious service: Not on file    Active member of club or organization: Not on file    Attends meetings of clubs or organizations: Not on file    Relationship status: Not on file  . Intimate partner violence    Fear of current or ex partner: Not on file    Emotionally abused: Not on file    Physically abused: Not on file    Forced sexual activity: Not on file  Other Topics Concern  . Not on file  Social History Narrative   Recently moved back to [place] after divorcing husband. Went to college here and still has friends here. Enjoys hiking. Trying to integrate herself back into the community.     Review of Systems  Constitutional: Negative.   HENT: Negative.   Eyes: Negative.   Respiratory: Negative.   Cardiovascular: Negative.   Gastrointestinal: Negative.   Genitourinary:       Vaginal discharge Vaginal itching Vaginal odor  Musculoskeletal: Negative.   Skin: Negative.   Neurological: Negative.   Endo/Heme/Allergies: Negative.   Psychiatric/Behavioral: Negative.     PHYSICAL EXAMINATION:    BP 108/60 (BP Location: Right Arm, Patient Position: Sitting, Cuff Size: Large)   Pulse 88   Temp (!) 97.3 F (36.3 C) (Skin)   Wt 196 lb 9.6 oz (89.2 kg)   BMI 38.40 kg/m     General appearance: alert, cooperative and appears stated age   Pelvic: External genitalia:  no lesions              Urethra:  normal appearing urethra with no masses, tenderness or lesions              Bartholins and Skenes: normal                 Vagina: normal appearing vagina with an increase in watery, frothy, white/yellow vaginal discharge              Cervix: no lesions and IUD string 3 cm               Chaperone was present for exam.  Wet prep: +  clue, no trich, few wbc KOH: no  yeast PH: 5   ASSESSMENT Bacterial vaginitis Depression/anxiety, working with Dr Juleen China. Discussed therapy, name given. Discussed medication    PLAN Treat with oral flagyl (no ETOH) Information on depression and anxiety given Discussed the option of trying an SSRI, she will discuss with Dr Juleen China.    An After Visit Summary was printed and given to the patient.  ~25 minutes face to face time of which over 50% was spent in counseling.

## 2019-09-08 NOTE — Patient Instructions (Addendum)
Therapist: Rogene Houston  [phone]  Generalized Anxiety Disorder, Adult Generalized anxiety disorder (GAD) is a mental health disorder. People with this condition constantly worry about everyday events. Unlike normal anxiety, worry related to GAD is not triggered by a specific event. These worries also do not fade or get better with time. GAD interferes with life functions, including relationships, work, and school. GAD can vary from mild to severe. People with severe GAD can have intense waves of anxiety with physical symptoms (panic attacks). What are the causes? The exact cause of GAD is not known. What increases the risk? This condition is more likely to develop in:  Women.  People who have a family history of anxiety disorders.  People who are very shy.  People who experience very stressful life events, such as the death of a loved one.  People who have a very stressful family environment. What are the signs or symptoms? People with GAD often worry excessively about many things in their lives, such as their health and family. They may also be overly concerned about:  Doing well at work.  Being on time.  Natural disasters.  Friendships. Physical symptoms of GAD include:  Fatigue.  Muscle tension or having muscle twitches.  Trembling or feeling shaky.  Being easily startled.  Feeling like your heart is pounding or racing.  Feeling out of breath or like you cannot take a deep breath.  Having trouble falling asleep or staying asleep.  Sweating.  Nausea, diarrhea, or irritable bowel syndrome (IBS).  Headaches.  Trouble concentrating or remembering facts.  Restlessness.  Irritability. How is this diagnosed? Your health care provider can diagnose GAD based on your symptoms and medical history. You will also have a physical exam. The health care provider will ask specific questions about your symptoms, including how severe they are, when they  started, and if they come and go. Your health care provider may ask you about your use of alcohol or drugs, including prescription medicines. Your health care provider may refer you to a mental health specialist for further evaluation. Your health care provider will do a thorough examination and may perform additional tests to rule out other possible causes of your symptoms. To be diagnosed with GAD, a person must have anxiety that:  Is out of his or her control.  Affects several different aspects of his or her life, such as work and relationships.  Causes distress that makes him or her unable to take part in normal activities.  Includes at least three physical symptoms of GAD, such as restlessness, fatigue, trouble concentrating, irritability, muscle tension, or sleep problems. Before your health care provider can confirm a diagnosis of GAD, these symptoms must be present more days than they are not, and they must last for six months or longer. How is this treated? The following therapies are usually used to treat GAD:  Medicine. Antidepressant medicine is usually prescribed for long-term daily control. Antianxiety medicines may be added in severe cases, especially when panic attacks occur.  Talk therapy (psychotherapy). Certain types of talk therapy can be helpful in treating GAD by providing support, education, and guidance. Options include: ? Cognitive behavioral therapy (CBT). People learn coping skills and techniques to ease their anxiety. They learn to identify unrealistic or negative thoughts and behaviors and to replace them with positive ones. ? Acceptance and commitment therapy (ACT). This treatment teaches people how to be mindful as a way to cope with unwanted thoughts and feelings. ? Biofeedback. This process  trains you to manage your body's response (physiological response) through breathing techniques and relaxation methods. You will work with a therapist while machines are used  to monitor your physical symptoms.  Stress management techniques. These include yoga, meditation, and exercise. A mental health specialist can help determine which treatment is best for you. Some people see improvement with one type of therapy. However, other people require a combination of therapies. Follow these instructions at home:  Take over-the-counter and prescription medicines only as told by your health care provider.  Try to maintain a normal routine.  Try to anticipate stressful situations and allow extra time to manage them.  Practice any stress management or self-calming techniques as taught by your health care provider.  Do not punish yourself for setbacks or for not making progress.  Try to recognize your accomplishments, even if they are small.  Keep all follow-up visits as told by your health care provider. This is important. Contact a health care provider if:  Your symptoms do not get better.  Your symptoms get worse.  You have signs of depression, such as: ? A persistently sad, cranky, or irritable mood. ? Loss of enjoyment in activities that used to bring you joy. ? Change in weight or eating. ? Changes in sleeping habits. ? Avoiding friends or family members. ? Loss of energy for normal tasks. ? Feelings of guilt or worthlessness. Get help right away if:  You have serious thoughts about hurting yourself or others. If you ever feel like you may hurt yourself or others, or have thoughts about taking your own life, get help right away. You can go to your nearest emergency department or call:  Your local emergency services (911 in the [place]  A suicide crisis helpline, such as the [facility] at 450-270-8945. This is open 24 hours a day. Summary  Generalized anxiety disorder (GAD) is a mental health disorder that involves worry that is not triggered by a specific event.  People with GAD often worry excessively about many things  in their lives, such as their health and family.  GAD may cause physical symptoms such as restlessness, trouble concentrating, sleep problems, frequent sweating, nausea, diarrhea, headaches, and trembling or muscle twitching.  A mental health specialist can help determine which treatment is best for you. Some people see improvement with one type of therapy. However, other people require a combination of therapies. This information is not intended to replace advice given to you by your health care provider. Make sure you discuss any questions you have with your health care provider. Document Released: 04/06/2013 Document Revised: 11/22/2017 Document Reviewed: 10/30/2016 Elsevier Patient Education  2020 [place]. Major Depressive Disorder, Adult Major depressive disorder (MDD) is a mental health condition. MDD often makes you feel sad, hopeless, or helpless. MDD can also cause symptoms in your body. MDD can affect your:  Work.  School.  Relationships.  Other normal activities. MDD can range from mild to very bad. It may occur once (single episode MDD). It can also occur many times (recurrent MDD). The main symptoms of MDD often include:  Feeling sad, depressed, or irritable most of the time.  Loss of interest. MDD symptoms also include:  Sleeping too much or too little.  Eating too much or too little.  A change in your weight.  Feeling tired (fatigue) or having low energy.  Feeling worthless.  Feeling guilty.  Trouble making decisions.  Trouble thinking clearly.  Thoughts of suicide or harming others.  Feeling weak.  Feeling agitated.  Keeping yourself from being around other people (isolation). Follow these instructions at home: Activity  Do these things as told by your doctor: ? Go back to your normal activities. ? Exercise regularly. ? Spend time outdoors. Alcohol  Talk with your doctor about how alcohol can affect your antidepressant medicines.  Do  not drink alcohol. Or, limit how much alcohol you drink. ? This means no more than 1 drink a day for nonpregnant women and 2 drinks a day for men. One drink equals one of these:  12 oz of beer.  5 oz of wine.  1 oz of hard liquor. General instructions  Take over-the-counter and prescription medicines only as told by your doctor.  Eat a healthy diet.  Get plenty of sleep.  Find activities that you enjoy. Make time to do them.  Think about joining a support group. Your doctor may be able to suggest a group for you.  Keep all follow-up visits as told by your doctor. This is important. Where to find more information:  [employer] on Mental Illness: ? www.nami.[place]: ? https://carter.com/  National Suicide Prevention Lifeline: ? 940-114-6325. This is free, 24-hour help. Contact a doctor if:  Your symptoms get worse.  You have new symptoms. Get help right away if:  You self-harm.  You see, hear, taste, smell, or feel things that are not present (hallucinate). If you ever feel like you may hurt yourself or others, or have thoughts about taking your own life, get help right away. You can go to your nearest emergency department or call:  Your local emergency services (911 in the [place]  A suicide crisis helpline, such as the National Suicide Prevention Lifeline: ? 603 717 9136. This is open 24 hours a day. This information is not intended to replace advice given to you by your health care provider. Make sure you discuss any questions you have with your health care provider. Document Released: 11/21/2015 Document Revised: 11/22/2017 Document Reviewed: 08/26/2016 Elsevier Patient Education  [place]. Vaginitis Vaginitis is a condition in which the vaginal tissue swells and becomes red (inflamed). This condition is most often caused by a change in the normal balance of bacteria and yeast that live in the vagina. This  change causes an overgrowth of certain bacteria or yeast, which causes the inflammation. There are different types of vaginitis, but the most common types are:  Bacterial vaginosis.  Yeast infection (candidiasis).  Trichomoniasis vaginitis. This is a sexually transmitted disease (STD).  Viral vaginitis.  Atrophic vaginitis.  Allergic vaginitis. What are the causes? The cause of this condition depends on the type of vaginitis. It can be caused by:  Bacteria (bacterial vaginosis).  Yeast, which is a fungus (yeast infection).  A parasite (trichomoniasis vaginitis).  A virus (viral vaginitis).  Low hormone levels (atrophic vaginitis). Low hormone levels can occur during pregnancy, breastfeeding, or after menopause.  Irritants, such as bubble baths, scented tampons, and feminine sprays (allergic vaginitis). Other factors can change the normal balance of the yeast and bacteria that live in the vagina. These include:  Antibiotic medicines.  Poor hygiene.  Diaphragms, vaginal sponges, spermicides, birth control pills, and intrauterine devices (IUD).  Sex.  Infection.  Uncontrolled diabetes.  A weakened defense (immune) system. What increases the risk? This condition is more likely to develop in women who:  Smoke.  Use vaginal douches, scented tampons, or scented sanitary pads.  Wear tight-fitting pants.  Wear  thong underwear.  Use oral birth control pills or an IUD.  Have sex without a condom.  Have multiple sex partners.  Have an STD.  Frequently use the spermicide nonoxynol-9.  Eat lots of foods high in sugar.  Have uncontrolled diabetes.  Have low estrogen levels.  Have a weakened immune system from an immune disorder or medical treatment.  Are pregnant or breastfeeding. What are the signs or symptoms? Symptoms vary depending on the cause of the vaginitis. Common symptoms include:  Abnormal vaginal discharge. ? The discharge is white, gray, or  yellow with bacterial vaginosis. ? The discharge is thick, white, and cheesy with a yeast infection. ? The discharge is frothy and yellow or greenish with trichomoniasis.  A bad vaginal smell. The smell is fishy with bacterial vaginosis.  Vaginal itching, pain, or swelling.  Sex that is painful.  Pain or burning when urinating. Sometimes there are no symptoms. How is this diagnosed? This condition is diagnosed based on your symptoms and medical history. A physical exam, including a pelvic exam, will also be done. You may also have other tests, including:  Tests to determine the pH level (acidity or alkalinity) of your vagina.  A whiff test, to assess the odor that results when a sample of your vaginal discharge is mixed with a potassium hydroxide solution.  Tests of vaginal fluid. A sample will be examined under a microscope. How is this treated? Treatment varies depending on the type of vaginitis you have. Your treatment may include:  Antibiotic creams or pills to treat bacterial vaginosis and trichomoniasis.  Antifungal medicines, such as vaginal creams or suppositories, to treat a yeast infection.  Medicine to ease discomfort if you have viral vaginitis. Your sexual partner should also be treated.  Estrogen delivered in a cream, pill, suppository, or vaginal ring to treat atrophic vaginitis. If vaginal dryness occurs, lubricants and moisturizing creams may help. You may need to avoid scented soaps, sprays, or douches.  Stopping use of a product that is causing allergic vaginitis. Then using a vaginal cream to treat the symptoms. Follow these instructions at home: Lifestyle  Keep your genital area clean and dry. Avoid soap, and only rinse the area with water.  Do not douche or use tampons until your health care provider says it is okay to do so. Use sanitary pads, if needed.  Do not have sex until your health care provider approves. When you can return to sex, practice safe  sex and use condoms.  Wipe from front to back. This avoids the spread of bacteria from the rectum to the vagina. General instructions  Take over-the-counter and prescription medicines only as told by your health care provider.  If you were prescribed an antibiotic medicine, take or use it as told by your health care provider. Do not stop taking or using the antibiotic even if you start to feel better.  Keep all follow-up visits as told by your health care provider. This is important. How is this prevented?  Use mild, non-scented products. Do not use things that can irritate the vagina, such as fabric softeners. Avoid the following products if they are scented: ? Feminine sprays. ? Detergents. ? Tampons. ? Feminine hygiene products. ? Soaps or bubble baths.  Let air reach your genital area. ? Wear cotton underwear to reduce moisture buildup. ? Avoid wearing underwear while you sleep. ? Avoid wearing tight pants and underwear or nylons without a cotton panel. ? Avoid wearing thong underwear.  Take off any  wet clothing, such as bathing suits, as soon as possible.  Practice safe sex and use condoms. Contact a health care provider if:  You have abdominal pain.  You have a fever.  You have symptoms that last for more than 2-3 days. Get help right away if:  You have a fever and your symptoms suddenly get worse. Summary  Vaginitis is a condition in which the vaginal tissue becomes inflamed.This condition is most often caused by a change in the normal balance of bacteria and yeast that live in the vagina.  Treatment varies depending on the type of vaginitis you have.  Do not douche, use tampons , or have sex until your health care provider approves. When you can return to sex, practice safe sex and use condoms. This information is not intended to replace advice given to you by your health care provider. Make sure you discuss any questions you have with your health care provider.  Document Released: 10/07/2007 Document Revised: 11/22/2017 Document Reviewed: 01/15/2017 Elsevier Patient Education  2020 [employer].

## 2019-10-08 ENCOUNTER — Telehealth: Payer: Self-pay | Admitting: Obstetrics and Gynecology

## 2019-10-08 NOTE — Telephone Encounter (Signed)
Patient finished medication and is not better.

## 2019-10-08 NOTE — Telephone Encounter (Signed)
Spoke with patient. Seen in office on 9/15 and tx with PO flagyl for BV, symptoms resolved. Reports blue/green vaginal d/c with odor and vaginal itching that started on 10/11. Patient is requesting RX. Advised OV needed for further evaluation, patient declined. Patient states she is scheduled for AEX 10/23 at 8:30am with Melvia Heaps, CNM, will wait until then. Advised to return call to office if symptoms worsen or earlier OV needed. Will review with covering provider and return call if any additional recommendations, patient agreeable.   Routing to provider for final review. Patient is agreeable to disposition. Will close encounter.  Cc: Melvia Heaps, CNM

## 2019-10-16 ENCOUNTER — Ambulatory Visit: Payer: 59 | Admitting: Certified Nurse Midwife

## 2019-10-29 ENCOUNTER — Other Ambulatory Visit: Payer: Self-pay

## 2019-11-02 ENCOUNTER — Encounter: Payer: Self-pay | Admitting: Certified Nurse Midwife

## 2019-11-02 ENCOUNTER — Ambulatory Visit: Payer: 59 | Admitting: Certified Nurse Midwife

## 2019-11-02 ENCOUNTER — Other Ambulatory Visit: Payer: Self-pay

## 2019-11-02 VITALS — BP 116/64 | HR 64 | Temp 97.0°F | Resp 16 | Ht 61.75 in | Wt 195.0 lb

## 2019-11-02 DIAGNOSIS — Z01419 Encounter for gynecological examination (general) (routine) without abnormal findings: Secondary | ICD-10-CM | POA: Diagnosis not present

## 2019-11-02 DIAGNOSIS — Z30431 Encounter for routine checking of intrauterine contraceptive device: Secondary | ICD-10-CM

## 2019-11-02 DIAGNOSIS — N898 Other specified noninflammatory disorders of vagina: Secondary | ICD-10-CM | POA: Diagnosis not present

## 2019-11-02 NOTE — Patient Instructions (Signed)

## 2019-11-02 NOTE — Progress Notes (Signed)
38 y.o. G0P0000 Divorced  Caucasian Fe here for annual exam. Was treated for BV and feels it has not resolved. Please check, slight itching/burning noted. IUD working well, inserted  In 12/2018. Scant to no bleeding with IUD, happy with choice. Will be establishing with new PCP(same practice) in 11/2019. Not sexually active. No other health issues today.  No LMP recorded. (Menstrual status: IUD).          Sexually active: No.  The current method of family planning is IUD.    Exercising: Yes.    some at work & at the park Smoker:  no  Review of Systems  Constitutional: Negative.   HENT: Negative.   Eyes: Negative.   Respiratory: Negative.   Cardiovascular: Negative.   Gastrointestinal: Negative.   Genitourinary: Negative.   Musculoskeletal: Negative.   Skin:       Discharge, itching & irritation  Neurological: Negative.   Endo/Heme/Allergies: Negative.   Psychiatric/Behavioral: Negative.     Health Maintenance: Pap:  10-10-18 neg HPV HR neg History of Abnormal Pap: yes MMG:  4-68yrs ago Self Breast exams: yes Colonoscopy:  none BMD:   none TDaP:  2020 Shingles: no Pneumonia: no Hep C and HIV: both neg 2019 Labs: if needed   reports that she has never smoked. She has never used smokeless tobacco. She reports current alcohol use. She reports previous drug use.  Past Medical History:  Diagnosis Date  . Abnormal Pap smear of cervix    age 3 or 62  . Anemia   . Anxiety   . Depression   . DVT (deep venous thrombosis) ([place])   . Migraines   . Sleep apnea   . Urinary incontinence     Past Surgical History:  Procedure Laterality Date  . CRYOTHERAPY     For abnormal pap smear, age 73 or 34  . INTRAUTERINE DEVICE INSERTION     PARAGUARD    Current Outpatient Medications  Medication Sig Dispense Refill  . Cholecalciferol (VITAMIN D PO) Take 2,000 Int'l Units by mouth.    . levonorgestrel (MIRENA) 20 MCG/24HR IUD 1 each by Intrauterine route once.    . Multiple  Vitamins-Minerals (MULTIVITAMIN PO) Take by mouth.     No current facility-administered medications for this visit.     Family History  Problem Relation Age of Onset  . Kidney cancer Mother   . Renal cancer Mother   . Diabetes Father   . Heart disease Father   . Stroke Father   . Uterine cancer Sister   . Diabetes Paternal Aunt   . Heart disease Maternal Grandfather   . Heart attack Maternal Grandfather   . Diabetes Paternal Grandmother   . Diabetes Paternal Grandfather   . Stroke Paternal Grandfather   . Heart attack Paternal Grandfather   . Ovarian cancer Sister     ROS:  Pertinent items are noted in HPI.  Otherwise, a comprehensive ROS was negative.  Exam:   BP 116/64   Pulse 64   Temp (!) 97 F (36.1 C) (Skin)   Resp 16   Ht 5' 1.75" (1.568 m)   Wt 195 lb (88.5 kg)   BMI 35.96 kg/m  Height: 5' 1.75" (156.8 cm) Ht Readings from Last 3 Encounters:  11/02/19 5' 1.75" (1.568 m)  08/19/19 5' (1.524 m)  07/01/19 5' (1.524 m)    General appearance: alert, cooperative and appears stated age Head: Normocephalic, without obvious abnormality, atraumatic Neck: no adenopathy, supple, symmetrical, trachea midline and thyroid  normal to inspection and palpation Lungs: clear to auscultation bilaterally Breasts: normal appearance, no masses or tenderness, No nipple retraction or dimpling, No nipple discharge or bleeding, No axillary or supraclavicular adenopathy Heart: regular rate and rhythm Abdomen: soft, non-tender; no masses,  no organomegaly Extremities: extremities normal, atraumatic, no cyanosis or edema Skin: Skin color, texture, turgor normal. No rashes or lesions Lymph nodes: Cervical, supraclavicular, and axillary nodes normal. No abnormal inguinal nodes palpated Neurologic: Grossly normal   Pelvic: External genitalia:  no lesions              Urethra:  normal appearing urethra with no masses, tenderness or lesions              Bartholin's and Skene's: normal                  Vagina: normal appearing vagina with normal color and discharge, no lesions              Cervix: no cervical motion tenderness, no lesions and normal appearance              Pap taken: No. Bimanual Exam:  Uterus:  normal size, contour, position, consistency, mobility, non-tender and anteverted              Adnexa: normal adnexa and no mass, fullness, tenderness               Rectovaginal: Confirms               Anus:  normal sphincter tone, no lesions  Chaperone present: yes  A:  Well Woman with normal exam  Contraception Mirena IUD due for removal 12/2023  R/O vaginal infection history of BV  Mammogram due after 01/14/2020  P:   Reviewed health and wellness pertinent to exam  Reviewed warning signs with IUD and bleeding expectations  Lab: Affirm  Comfort measures with Aveeno sitz bath or baking soda for itching  Given information to schedule.  Pap smear: no   counseled on breast self exam, mammography screening, STD prevention, HIV risk factors and prevention, adequate intake of calcium and vitamin D, diet and exercise  return annually or prn  An After Visit Summary was printed and given to the patient.

## 2019-11-03 ENCOUNTER — Telehealth: Payer: Self-pay

## 2019-11-03 LAB — VAGINITIS/VAGINOSIS, DNA PROBE
Candida Species: NEGATIVE
Gardnerella vaginalis: POSITIVE — AB
Trichomonas vaginosis: NEGATIVE

## 2019-11-03 NOTE — Telephone Encounter (Signed)
Left message for call back.

## 2019-11-03 NOTE — Telephone Encounter (Signed)
-----   Message from Regina Eck, CNM sent at 11/03/2019 11:47 AM EST ----- Notify patient her vaginal screen was negative for yeast and trichomonas. BV was positive  Would recommend Rx Metrogel one applicator bid x7.  No alcohol use during treatment

## 2019-11-04 MED ORDER — METRONIDAZOLE 0.75 % VA GEL: 1.0000 | Freq: Two times a day (BID) | VAGINAL | 0 refills | Status: AC

## 2019-11-04 NOTE — Telephone Encounter (Signed)
Patient notified of results & metrogel sent to pharmacy.

## 2019-11-11 NOTE — Progress Notes (Addendum)
PATIENT: Savannah George DOB: 08/26/1981  REASON FOR VISIT: follow up HISTORY FROM: patient  Chief Complaint  Patient presents with   Follow-up    Corner room, alone. Sleeping better. Dreaming more. sometimes wakes in the middle of the night     HISTORY OF PRESENT ILLNESS: Today 11/12/19 Savannah George is a 38 y.o. female here today for follow up for OSA on CPAP. Sleep study revealed mild NREM and severe REM OSA. CPAP therapy started in . She returns today for initial CPAP compliance visit.  She is doing very well with CPAP therapy.  She has adjusted to the nasal mask.  She feels that she is sleeping more soundly.  It is much easier for her to fall back asleep if she wakes up.  Compliance report dated 10/12/2019 through 11/10/2019 reveals that she used CPAP 30 out of the last 30 days for compliance of 100%.  She used CPAP greater than 4 hours all 30 days for compliance of 100%.  Average usage was 7 hours and 54 minutes.  AHI was 0.2 on 6 to 12 cm of water and an EPR of 3.  There was no leak noted.   HISTORY: (copied from Dr Guadelupe Sabin note on 07/01/2019)  Dear Dr. Juleen China,   I saw your patient, Savannah George, upon your kind request in the sleep clinic today for initial consultation of her cognitive complaints/memory loss.  The patient is unaccompanied today.  I have previously seen her last year for daytime somnolence.,  She had an interim home sleep test in January 2020 which showed borderline or very mild obstructive sleep apnea.  As you know, Savannah George is a 38 year old right-handed woman with an underlying medical history of migraines, history of DVT, depression, anxiety, obesity, fibromyalgia and anemia, who reports problems with her memory for the past 1-1/2 years.  She has noted more difficulty in the past 6 months or so.  She has noticed originally in late 2018 or early 2019 difficulty with focusing.  She had other symptoms at the time and was diagnosed with vitamin D deficiency, she had a  20 pound weight gain since she moved back to [place] in July 2018.  She has had thyroid issues but had it checked several times.  She had multiple blood tests over the past year and year and a half.  She has difficulty with short-term memory, has word finding difficulty, difficulty concentrating, difficulty with name recall and sequencing tasks.  She has noticed increase in stress and mood lability.  She is in fact quite tearful during the entire office visit and anxious.  She has in the past seen a psychologist and therapist but not recently and not since she moved back to [place].  She is currently not on any antidepressant or anxiety medication.  She has been taking low-dose trazodone at night for sleep and had been on generic Adderall until recently and is now on Provigil 100 mg daily.  She reports difficulty with sleep at night and daytime somnolence.  Her home sleep test showed really borderline sleep apnea and she was offered to consider AutoPap therapy but it was not necessary and she decided against it.  She reports no family history of dementia, mother died 46 years ago at a younger age, 9 from kidney cancer.  Her father is 58 years old.  She reports that her 28 year old sister has had memory complaints and has a mood disorder including bipolar disease and suffers from insomnia as well. She has  had recent blood work which I reviewed, on 05/08/2019 she had CBC with differential, CMP, A1c, lipid panel, TSH.  All of these were unremarkable.  In March 2020 she had BMP and magnesium, in February she had ESR, CRP, thyroid peroxidase antibodies, ANA and CBC with differential, all unremarkable, with the exception of slightly elevated platelet count, total cholesterol of 228 and LDL of 151.  In March 2019 she had a normal B12 level and she also had RPR and HIV checked last year. I reviewed your recent office notes including video based visit notes. Previously:  11/25/2018: 38 year old right-handed woman  with an underlying medical history of migraines, DVT, depression, anxiety, anemia, and obesity, who reports worsening daytime tiredness and sleepiness over the past couple of years, as well as intermittent snoring.she does not wake up rested. She has had trouble maintaining sleep but typically no difficulty falling asleep. I reviewed your office note from 10/24/2018. Her Epworth sleepiness score is 11 out of 24 today, fatigue score is 46 out of 63. She is single/divorced and lives with a roommate, she works at the [employer]. She has no children. She is a nonsmoker and drinks alcohol occasionally, about 2 times a week, caffeine in the form of coffee and soda, up to 2-3 servings per day, but not necessarily daily. Her father has sleep apnea and has a CPAP machine. Her ex-husband has sleep apnea and she is familiar with the diagnosis. She would be willing to consider CPAP therapy if the need arises. She has been struggling with her weight. She has not been as active this past year. She moved from [place] about 15 months ago. She endorses increase in stress, lost her mother at age 40 about 2 years ago and also went through a divorce recently. She denies telltale symptoms versus leg syndrome. She is not aware of any leg twitching while asleep. She does tend to be a restless sleeper at times. She has woken up with a headache, up to 2 times per week, denies night to night nocturia. Her bedtime is generally between 9:30 and 10:30 PM, rise time around 6. She does not watch TV in her bedroom, she has 3 cats in her bedroom.When she has difficulty maintaining sleep and frequent nighttime awakenings, she tends to have more physical exhaustion, foggy headedness and difficulty with concentration.   REVIEW OF SYSTEMS: Out of a complete 14 system review of symptoms, the patient complains only of the following symptoms, apnea, depression, anxiety and all other reviewed systems are negative.  Epworth  sleepiness scale: 8 Fatigue severity scale: 27  ALLERGIES: Allergies  Allergen Reactions   Aspirin Hives   Latex Hives    HOME MEDICATIONS: Outpatient Medications Prior to Visit  Medication Sig Dispense Refill   levonorgestrel (MIRENA) 20 MCG/24HR IUD 1 each by Intrauterine route once.     metroNIDAZOLE (METROGEL) 0.75 % vaginal gel Place 1 Applicatorful vaginally 2 (two) times daily.     Multiple Vitamins-Minerals (MULTIVITAMIN PO) Take by mouth.     Cholecalciferol (VITAMIN D PO) Take 2,000 Int'l Units by mouth.     No facility-administered medications prior to visit.     PAST MEDICAL HISTORY: Past Medical History:  Diagnosis Date   Abnormal Pap smear of cervix    age 51 or 37   Anemia    Anxiety    Depression    DVT (deep venous thrombosis) (HCC)    Migraines    Sleep apnea    Urinary incontinence  PAST SURGICAL HISTORY: Past Surgical History:  Procedure Laterality Date   CRYOTHERAPY     For abnormal pap smear, age 55 or 89   INTRAUTERINE DEVICE INSERTION     PARAGUARD    FAMILY HISTORY: Family History  Problem Relation Age of Onset   Kidney cancer Mother    Renal cancer Mother    Diabetes Father    Heart disease Father    Stroke Father    Uterine cancer Sister    Diabetes Paternal Aunt    Heart disease Maternal Grandfather    Heart attack Maternal Grandfather    Diabetes Paternal Grandmother    Diabetes Paternal Grandfather    Stroke Paternal Grandfather    Heart attack Paternal Grandfather    Ovarian cancer Sister     SOCIAL HISTORY: Social History   Socioeconomic History   Marital status: Divorced    Spouse name: Not on file   Number of children: Not on file   Years of education: Not on file   Highest education level: Master's degree (e.g., MA, MS, MEng, MEd, MSW, MBA)  Occupational History   Occupation: Tree surgeon: Automotive engineer strain:  Not on file   Food insecurity    Worry: Not on file    Inability: Not on Lexicographer needs    Medical: Not on file    Non-medical: Not on file  Tobacco Use   Smoking status: Never Smoker   Smokeless tobacco: Never Used  Substance and Sexual Activity   Alcohol use: Yes    Alcohol/week: 0.0 - 3.0 standard drinks   Drug use: Not Currently   Sexual activity: Not Currently    Partners: Male, Female    Birth control/protection: I.U.D.  Lifestyle   Physical activity    Days per week: Not on file    Minutes per session: Not on file   Stress: Not on file  Relationships   Social connections    Talks on phone: Not on file    Gets together: Not on file    Attends religious service: Not on file    Active member of club or organization: Not on file    Attends meetings of clubs or organizations: Not on file    Relationship status: Not on file   Intimate partner violence    Fear of current or ex partner: Not on file    Emotionally abused: Not on file    Physically abused: Not on file    Forced sexual activity: Not on file  Other Topics Concern   Not on file  Social History Narrative   Recently moved back to [place] after divorcing husband. Went to college here and still has friends here. Enjoys hiking. Trying to integrate herself back into the community.       PHYSICAL EXAM  Vitals:   11/12/19 0759  BP: 127/84  Pulse: 63  Temp: 97.7 F (36.5 C)  Weight: 200 lb (90.7 kg)  Height: 5' 1"  (1.549 m)   Body mass index is 37.79 kg/m.  Generalized: Well developed, in no acute distress  Respiratory: Clear to auscultation bilaterally Cardiology: normal rate and rhythm, no murmur noted Neurological examination  Mentation: Alert oriented to time, place, history taking. Follows all commands speech and language fluent Cranial nerve II-XII: Pupils were equal round reactive to light. Extraocular movements were full, visual field were full on confrontational  test. Motor: The motor testing reveals 5 over  5 strength of all 4 extremities. Good symmetric motor tone is noted throughout.  Coordination: Cerebellar testing reveals good finger-nose-finger and heel-to-shin bilaterally.  Gait and station: Gait is normal.    DIAGNOSTIC DATA (LABS, IMAGING, TESTING) - I reviewed patient records, labs, notes, testing and imaging myself where available.  MMSE - Mini Mental State Exam 07/01/2019  Orientation to time 5  Orientation to Place 4  Registration 3  Attention/ Calculation 4  Recall 3  Language- name 2 objects 2  Language- repeat 1  Language- follow 3 step command 3  Language- read & follow direction 1  Write a sentence 1  Copy design 1  Total score 28     Lab Results  Component Value Date   WBC 6.9 05/08/2019   HGB 13.2 05/08/2019   HCT 37.9 05/08/2019   MCV 87.2 05/08/2019   PLT 376.0 05/08/2019      Component Value Date/Time   NA 138 05/08/2019 0847   NA 137 05/08/2018   K 4.5 05/08/2019 0847   CL 103 05/08/2019 0847   CO2 26 05/08/2019 0847   GLUCOSE 93 05/08/2019 0847   BUN 11 05/08/2019 0847   BUN 13 05/08/2018   CREATININE 0.71 05/08/2019 0847   CALCIUM 9.3 05/08/2019 0847   PROT 7.0 05/08/2019 0847   ALBUMIN 4.5 05/08/2019 0847   AST 13 05/08/2019 0847   ALT 15 05/08/2019 0847   ALKPHOS 79 05/08/2019 0847   BILITOT 0.4 05/08/2019 0847   Lab Results  Component Value Date   CHOL 228 (H) 05/08/2019   HDL 49.00 05/08/2019   LDLCALC 151 (H) 05/08/2019   TRIG 138.0 05/08/2019   CHOLHDL 5 05/08/2019   Lab Results  Component Value Date   HGBA1C 5.7 05/08/2019   Lab Results  Component Value Date   VITAMINB12 842 02/25/2018   Lab Results  Component Value Date   TSH 1.84 05/08/2019       ASSESSMENT AND PLAN 38 y.o. year old female  has a past medical history of Abnormal Pap smear of cervix, Anemia, Anxiety, Depression, DVT (deep venous thrombosis) ([place]), Migraines, Sleep apnea, and Urinary incontinence. here  with     ICD-10-CM   1. OSA on CPAP  G47.33    Z99.89     However reports that she is feeling much better on CPAP therapy.  That she is resting more soundly and feels better rested in the mornings.  Compliance report reveals excellent compliance.  She was encouraged to continue nightly use and greater than 4 hours each night.  She will follow-up in 1 year, sooner if needed.  She verbalizes understanding and agreement with this plan.   No orders of the defined types were placed in this encounter.    No orders of the defined types were placed in this encounter.     I spent 15 minutes with the patient. 50% of this time was spent counseling and educating patient on plan of care and medications.    Debbora Presto, FNP-C 11/12/2019, 8:03 AM [facility] [address], [place], [place] [address] [phone]  I reviewed the above note and documentation by the Nurse Practitioner and agree with the history, exam, assessment and plan as outlined above. I was available for consultation. Star Age, MD, PhD [facility])

## 2019-11-11 NOTE — Patient Instructions (Addendum)
Continue CPAP nightly and for greater than 4 hours each night   Follow up in 1 year, sooner if needed   Sleep Apnea Sleep apnea affects breathing during sleep. It causes breathing to stop for a short time or to become shallow. It can also increase the risk of:  Heart attack.  Stroke.  Being very overweight (obese).  Diabetes.  Heart failure.  Irregular heartbeat. The goal of treatment is to help you breathe normally again. What are the causes? There are three kinds of sleep apnea:  Obstructive sleep apnea. This is caused by a blocked or collapsed airway.  Central sleep apnea. This happens when the brain does not send the right signals to the muscles that control breathing.  Mixed sleep apnea. This is a combination of obstructive and central sleep apnea. The most common cause of this condition is a collapsed or blocked airway. This can happen if:  Your throat muscles are too relaxed.  Your tongue and tonsils are too large.  You are overweight.  Your airway is too small. What increases the risk?  Being overweight.  Smoking.  Having a small airway.  Being older.  Being female.  Drinking alcohol.  Taking medicines to calm yourself (sedatives or tranquilizers).  Having family members with the condition. What are the signs or symptoms?  Trouble staying asleep.  Being sleepy or tired during the day.  Getting angry a lot.  Loud snoring.  Headaches in the morning.  Not being able to focus your mind (concentrate).  Forgetting things.  Less interest in sex.  Mood swings.  Personality changes.  Feelings of sadness (depression).  Waking up a lot during the night to pee (urinate).  Dry mouth.  Sore throat. How is this diagnosed?  Your medical history.  A physical exam.  A test that is done when you are sleeping (sleep study). The test is most often done in a sleep lab but may also be done at home. How is this treated?   Sleeping on your  side.  Using a medicine to get rid of mucus in your nose (decongestant).  Avoiding the use of alcohol, medicines to help you relax, or certain pain medicines (narcotics).  Losing weight, if needed.  Changing your diet.  Not smoking.  Using a machine to open your airway while you sleep, such as: ? An oral appliance. This is a mouthpiece that shifts your lower jaw forward. ? A CPAP device. This device blows air through a mask when you breathe out (exhale). ? An EPAP device. This has valves that you put in each nostril. ? A BPAP device. This device blows air through a mask when you breathe in (inhale) and breathe out.  Having surgery if other treatments do not work. It is important to get treatment for sleep apnea. Without treatment, it can lead to:  High blood pressure.  Coronary artery disease.  In men, not being able to have an erection (impotence).  Reduced thinking ability. Follow these instructions at home: Lifestyle  Make changes that your doctor recommends.  Eat a healthy diet.  Lose weight if needed.  Avoid alcohol, medicines to help you relax, and some pain medicines.  Do not use any products that contain nicotine or tobacco, such as cigarettes, e-cigarettes, and chewing tobacco. If you need help quitting, ask your doctor. General instructions  Take over-the-counter and prescription medicines only as told by your doctor.  If you were given a machine to use while you sleep, use it  only as told by your doctor.  If you are having surgery, make sure to tell your doctor you have sleep apnea. You may need to bring your device with you.  Keep all follow-up visits as told by your doctor. This is important. Contact a doctor if:  The machine that you were given to use during sleep bothers you or does not seem to be working.  You do not get better.  You get worse. Get help right away if:  Your chest hurts.  You have trouble breathing in enough air.  You have  an uncomfortable feeling in your back, arms, or stomach.  You have trouble talking.  One side of your body feels weak.  A part of your face is hanging down. These symptoms may be an emergency. Do not wait to see if the symptoms will go away. Get medical help right away. Call your local emergency services (911 in the [place] Do not drive yourself to the hospital. Summary  This condition affects breathing during sleep.  The most common cause is a collapsed or blocked airway.  The goal of treatment is to help you breathe normally while you sleep. This information is not intended to replace advice given to you by your health care provider. Make sure you discuss any questions you have with your health care provider. Document Released: 09/18/2008 Document Revised: 09/26/2018 Document Reviewed: 08/05/2018 Elsevier Patient Education  2020 [employer].

## 2019-11-12 ENCOUNTER — Encounter: Payer: Self-pay | Admitting: Family Medicine

## 2019-11-12 ENCOUNTER — Other Ambulatory Visit: Payer: Self-pay

## 2019-11-12 ENCOUNTER — Ambulatory Visit: Payer: 59 | Admitting: Family Medicine

## 2019-11-12 VITALS — BP 127/84 | HR 63 | Temp 97.7°F | Ht 61.0 in | Wt 200.0 lb

## 2019-11-12 DIAGNOSIS — Z9989 Dependence on other enabling machines and devices: Secondary | ICD-10-CM

## 2019-11-12 DIAGNOSIS — G4733 Obstructive sleep apnea (adult) (pediatric): Secondary | ICD-10-CM | POA: Diagnosis not present

## 2019-11-17 ENCOUNTER — Encounter: Payer: 59 | Admitting: Family Medicine

## 2019-12-22 ENCOUNTER — Encounter: Payer: 59 | Admitting: Psychology

## 2020-01-04 ENCOUNTER — Encounter: Payer: 59 | Admitting: Family Medicine

## 2020-03-04 ENCOUNTER — Other Ambulatory Visit: Payer: Self-pay

## 2020-03-04 ENCOUNTER — Telehealth: Payer: Self-pay | Admitting: Family Medicine

## 2020-03-04 ENCOUNTER — Encounter: Payer: 59 | Admitting: Family Medicine

## 2020-03-04 ENCOUNTER — Other Ambulatory Visit: Payer: Self-pay | Admitting: Physician Assistant

## 2020-03-04 ENCOUNTER — Ambulatory Visit (INDEPENDENT_AMBULATORY_CARE_PROVIDER_SITE_OTHER): Payer: 59 | Admitting: Physician Assistant

## 2020-03-04 ENCOUNTER — Encounter: Payer: Self-pay | Admitting: Physician Assistant

## 2020-03-04 VITALS — BP 120/80 | HR 120 | Temp 98.7°F | Ht 61.0 in | Wt 214.0 lb

## 2020-03-04 DIAGNOSIS — M797 Fibromyalgia: Secondary | ICD-10-CM

## 2020-03-04 MED ORDER — PREDNISONE 20 MG PO TABS: 20.0000 mg | ORAL_TABLET | Freq: Two times a day (BID) | ORAL | 0 refills | Status: DC

## 2020-03-04 NOTE — Progress Notes (Signed)
Savannah George is a 39 y.o. female here for a follow up of a pre-existing problem.  I acted as a Education administrator for [employer], PA-C Anselmo Pickler, LPN  History of Present Illness:   Chief Complaint  Patient presents with  . Fibromyalgia    HPI   Fibromyalgia Pt c/o a flare, started almost two weeks ago. Having memory issues -- which is common for her. Will often feel "deep weakness and fatigue, as if I've had no sleep in a week." Most of the pain is in her lower body and upper areas touch sensitive. She is having muscle spasms in her legs. Symptoms are worst in L leg, has had physical therapy in around 2019 for this.  Has flares often, but usually works through them -- has about a few days a month that she has to miss work. This is her longest flare that she has had in awhile. She has missed this whole week of work due to her pain.   She is unsure of what triggers her flares.  She was supposed to see a rheumatologist and did see neurologist for OSA, CPAP started in fall 2020.  Went to physical therapy this morning. Planning to start PT scheduled next week. In the past, has used foam roller, stretch bands. Has used muscle relaxer when having severe spasms in her leg.   Has tried amitriptyline, tramadol, oxycodone,   Has not tried cymbalta, gabapentin, or prednisone during her flares or for maintenance.     Past Medical History:  Diagnosis Date  . Abnormal Pap smear of cervix    age 28 or 13  . Anemia   . Anxiety   . Depression   . DVT (deep venous thrombosis) ([place])   . Migraines   . Sleep apnea   . Urinary incontinence      Social History   Socioeconomic History  . Marital status: Divorced    Spouse name: Not on file  . Number of children: Not on file  . Years of education: Not on file  . Highest education level: Master's degree (e.g., MA, MS, MEng, MEd, MSW, MBA)  Occupational History  . Occupation: Tree surgeon: [employer]  .  Smoking status: Never Smoker  . Smokeless tobacco: Never Used  Substance and Sexual Activity  . Alcohol use: Yes    Alcohol/week: 0.0 - 3.0 standard drinks  . Drug use: Not Currently  . Sexual activity: Not Currently    Partners: Male, Female    Birth control/protection: I.U.D.  Other Topics Concern  . Not on file  Social History Narrative   Recently moved back to [place] after divorcing husband. Went to college here and still has friends here. Enjoys hiking. Trying to integrate herself back into the community.    Social Determinants of Health   Financial Resource Strain:   . Difficulty of Paying Living Expenses:   Food Insecurity:   . Worried About Charity fundraiser in the Last Year:   . Arboriculturist in the Last Year:   Transportation Needs:   . Film/video editor (Medical):   Marland Kitchen Lack of Transportation (Non-Medical):   Physical Activity:   . Days of Exercise per Week:   . Minutes of Exercise per Session:   Stress:   . Feeling of Stress :   Social Connections:   . Frequency of Communication with Friends and Family:   . Frequency of Social Gatherings with Friends and  Family:   . Attends Religious Services:   . Active Member of Clubs or Organizations:   . Attends Archivist Meetings:   Marland Kitchen Marital Status:   Intimate Partner Violence:   . Fear of Current or Ex-Partner:   . Emotionally Abused:   Marland Kitchen Physically Abused:   . Sexually Abused:     Past Surgical History:  Procedure Laterality Date  . CRYOTHERAPY     For abnormal pap smear, age 63 or 72  . INTRAUTERINE DEVICE INSERTION     PARAGUARD    Family History  Problem Relation Age of Onset  . Kidney cancer Mother   . Renal cancer Mother   . Diabetes Father   . Heart disease Father   . Stroke Father   . Uterine cancer Sister   . Diabetes Paternal Aunt   . Heart disease Maternal Grandfather   . Heart attack Maternal Grandfather   . Diabetes Paternal Grandmother   . Diabetes Paternal Grandfather   .  Stroke Paternal Grandfather   . Heart attack Paternal Grandfather   . Ovarian cancer Sister     Allergies  Allergen Reactions  . Aspirin Hives  . Latex Hives    Current Medications:   Current Outpatient Medications:  .  Cholecalciferol (VITAMIN D PO), Take 2,000 Int'l Units by mouth., Disp: , Rfl:  .  ibuprofen (MOTRIN IB) 200 MG tablet, Take 400 mg by mouth every 6 (six) hours as needed., Disp: , Rfl:  .  levonorgestrel (MIRENA) 20 MCG/24HR IUD, 1 each by Intrauterine route once., Disp: , Rfl:  .  Multiple Vitamins-Minerals (MULTIVITAMIN PO), Take by mouth., Disp: , Rfl:  .  predniSONE (DELTASONE) 20 MG tablet, Take 1 tablet (20 mg total) by mouth 2 (two) times daily with a meal., Disp: 10 tablet, Rfl: 0   Review of Systems:   ROS  Negative unless otherwise specified per HPI.  Vitals:   Vitals:   03/04/20 1321  BP: 120/80  Pulse: (!) 120  Temp: 98.7 F (37.1 C)  TempSrc: Temporal  SpO2: 96%  Weight: 214 lb (97.1 kg)  Height: 5\' 1"  (1.549 m)     Body mass index is 40.43 kg/m.  Physical Exam:   Physical Exam Vitals and nursing note reviewed.  Constitutional:      General: She is not in acute distress.    Appearance: She is well-developed. She is not ill-appearing or toxic-appearing.  Cardiovascular:     Rate and Rhythm: Normal rate and regular rhythm.     Pulses: Normal pulses.     Heart sounds: Normal heart sounds, S1 normal and S2 normal.     Comments: No LE edema Pulmonary:     Effort: Pulmonary effort is normal.     Breath sounds: Normal breath sounds.  Skin:    General: Skin is warm and dry.  Neurological:     Mental Status: She is alert.     GCS: GCS eye subscore is 4. GCS verbal subscore is 5. GCS motor subscore is 6.  Psychiatric:        Attention and Perception: Attention normal.        Mood and Affect: Mood is anxious.        Speech: Speech normal.        Behavior: Behavior normal. Behavior is cooperative.     Assessment and Plan:    Emmerie was seen today for fibromyalgia.  Diagnoses and all orders for this visit:  Fibromyalgia  Other orders -  predniSONE (DELTASONE) 20 MG tablet; Take 1 tablet (20 mg total) by mouth 2 (two) times daily with a meal.   No red flags on discussion today. We are going to trial a round of prednisone and then have her follow-up with me in 1 to 2 weeks. Continue physical therapy. Did discuss side effects of prednisone and recommended that she stop this medication if she has any unpleasant side effects. Patient was agreeable to plan.  She would like a referral to rheumatologist and was recommended to see someone in particular but she cannot remember the name, she is going to see me a MyChart message with this recommendation.  . Reviewed expectations re: course of current medical issues. . Discussed self-management of symptoms. . Outlined signs and symptoms indicating need for more acute intervention. . Patient verbalized understanding and all questions were answered. . See orders for this visit as documented in the electronic medical record. . Patient received an After-Visit Summary.  CMA or LPN served as scribe during this visit. History, Physical, and Plan performed by medical provider. The above documentation has been reviewed and is accurate and complete.  Inda Coke, PA-C

## 2020-03-04 NOTE — Telephone Encounter (Signed)
Are you here pcp now? Did not have one listed

## 2020-03-04 NOTE — Telephone Encounter (Signed)
Pt called stating the Physical Therapy agency she uses is [facility] - therapist named Cheri Pourtright. Rheumatologist she uses is Publishing rights manager.

## 2020-03-04 NOTE — Telephone Encounter (Signed)
I am not PCP, she is scheduled to see Jerline Pain.  I am going to put in referral to Dr. Estanislado Pandy per patient discussion today.

## 2020-03-04 NOTE — Patient Instructions (Addendum)
It was great to see you!  Please send me the name of the rheumatologist you would like to see.  Continue with physical therapy.  Start the oral prednisone. If it causes unpleasant side effects, please stop the medication immediately.  Consider in the future: cymbalta or gabapentin  Let's follow-up in the next 1-2 weeks, sooner if you have concerns.  Take care,  Inda Coke PA-C

## 2020-03-11 ENCOUNTER — Encounter: Payer: Self-pay | Admitting: Certified Nurse Midwife

## 2020-03-16 ENCOUNTER — Telehealth (INDEPENDENT_AMBULATORY_CARE_PROVIDER_SITE_OTHER): Payer: 59 | Admitting: Family Medicine

## 2020-03-16 ENCOUNTER — Encounter: Payer: Self-pay | Admitting: Family Medicine

## 2020-03-16 DIAGNOSIS — K589 Irritable bowel syndrome without diarrhea: Secondary | ICD-10-CM | POA: Diagnosis not present

## 2020-03-16 DIAGNOSIS — M797 Fibromyalgia: Secondary | ICD-10-CM

## 2020-03-16 MED ORDER — PREDNISONE 20 MG PO TABS: 20.0000 mg | ORAL_TABLET | Freq: Two times a day (BID) | ORAL | 0 refills | Status: DC

## 2020-03-16 MED ORDER — ONDANSETRON 8 MG PO TBDP: 8.0000 mg | ORAL_TABLET | Freq: Three times a day (TID) | ORAL | 0 refills | Status: DC | PRN

## 2020-03-16 MED ORDER — AMITRIPTYLINE HCL 10 MG PO TABS: 10.0000 mg | ORAL_TABLET | Freq: Every day | ORAL | 5 refills | Status: DC

## 2020-03-16 NOTE — Assessment & Plan Note (Signed)
Has referral to rheumatology pending.  Given concurrent insomnia and IBS, think she would be a good candidate for amitriptyline.  Will start 10 mg daily at bedtime.  Will increase as tolerated over the next several weeks.  Also start prednisone burst as this worked well for her in the past.  Will send in small supply of Zofran to use as needed for nausea.

## 2020-03-16 NOTE — Assessment & Plan Note (Signed)
Hopefully should have some improvement with starting of amitriptyline 10 mg daily.

## 2020-03-16 NOTE — Progress Notes (Signed)
   Savannah George is a 39 y.o. female who presents today for a virtual office visit.  Assessment/Plan:  Chronic Problems Addressed Today: Fibromyalgia Has referral to rheumatology pending.  Given concurrent insomnia and IBS, think she would be a good candidate for amitriptyline.  Will start 10 mg daily at bedtime.  Will increase as tolerated over the next several weeks.  Also start prednisone burst as this worked well for her in the past.  Will send in small supply of Zofran to use as needed for nausea.  IBS (irritable bowel syndrome) Hopefully should have some improvement with starting of amitriptyline 10 mg daily.    Subjective:  HPI:  Patient with long standing history of fibromylagia. About a year ago there was concern that her poor sleep was contributing and she was start on trazodone and adderall.  Neither of these symptoms help significantly.  Symptoms have been getting worse.  Couple of weeks ago she had a flare and was given a burst prednisone.  Symptoms improved for about a week however it worsened again over the last few days.  She has now having paresthesias along her entire left body from her ankle to her shoulder.  She has some associated nausea and fatigue as well.  She has had limited ability to do things at work due to the fatigue and numbness.  She has had some left hip pain as well that is worse with certain motions.  She is working with PT which helps modestly.  She also has noticed increased sensitivity to light touch to the area.       Objective/Observations  Physical Exam: Gen: NAD, resting comfortably Pulm: Normal work of breathing Neuro: Grossly normal, moves all extremities Psych: Normal affect and thought content  Virtual Visit via Video   I connected with Gar Ponto on 03/16/20 at 11:20 AM EDT by a video enabled telemedicine application and verified that I am speaking with the correct person using two identifiers. The limitations of evaluation and management by  telemedicine and the availability of in person appointments were discussed. The patient expressed understanding and agreed to proceed.   Patient location: Home Provider location: [place] [facility] Persons participating in the virtual visit: Myself and Patient  Time Spent: 45 minutes of total time was spent on the date of the encounter performing the following actions: chart review prior to seeing the patient, obtaining history, performing a medically necessary exam, counseling on the treatment plan, placing orders, and documenting in our EHR.       Algis Greenhouse. Jerline Pain, MD 03/16/2020 11:58 AM

## 2020-03-21 ENCOUNTER — Telehealth: Payer: Self-pay | Admitting: Family Medicine

## 2020-03-21 NOTE — Telephone Encounter (Signed)
Left voice message for patient to call clinic.  

## 2020-03-21 NOTE — Telephone Encounter (Signed)
Patient calling in to let Dr. Jerline Pain know how she is doing since her Virtual Visit. Patient would like a CMA to call her back to discuss.

## 2020-03-21 NOTE — Telephone Encounter (Signed)
We started very lose dose of amitriptyline and she has been on it for 5 days. Recommend that she increase dose to 20mg  at 14 day mark but would like for her to check back in with [place] in 1-2 weeks.  Algis Greenhouse. Jerline Pain, MD 03/21/2020 4:04 PM

## 2020-03-21 NOTE — Telephone Encounter (Signed)
Pt return call  Stated checking in with PCP from last visit  Still with radiating pain and numbness to  Left hip, No changes since last visit. Fibromyalgia Sx of nausea dizziness and trouble focusing still same no changes

## 2020-03-21 NOTE — Telephone Encounter (Signed)
Patient sated Amitriptyline making her space out and unable to concentrate.  Same symptoms happen a couple of years ago when she was taking Amitriptyline. Numbness on hip worsen with sitting, unsure if this is due to  IBS or back problems  Please advise

## 2020-03-22 ENCOUNTER — Other Ambulatory Visit: Payer: Self-pay

## 2020-03-22 NOTE — Telephone Encounter (Signed)
Probably coming from her back issues. If she is having intolerable side effects recommend that she STOP completely. Would like for her to let us know if she wants to try a different medication for IBS/fibromylagia.

## 2020-03-22 NOTE — Telephone Encounter (Signed)
Left message to return call to our office at their convenience.  

## 2020-03-22 NOTE — Telephone Encounter (Signed)
Patient schedule appointment with PCP will hold on new medication till appointment day

## 2020-03-23 ENCOUNTER — Ambulatory Visit: Payer: 59 | Admitting: Family Medicine

## 2020-03-29 ENCOUNTER — Ambulatory Visit: Payer: 59 | Admitting: Family Medicine

## 2020-04-21 ENCOUNTER — Ambulatory Visit: Payer: 59 | Admitting: Rheumatology

## 2020-04-26 ENCOUNTER — Encounter: Payer: 59 | Admitting: Family Medicine

## 2020-04-26 DIAGNOSIS — Z0289 Encounter for other administrative examinations: Secondary | ICD-10-CM

## 2020-06-17 ENCOUNTER — Telehealth: Payer: Self-pay | Admitting: Family Medicine

## 2020-06-17 ENCOUNTER — Other Ambulatory Visit: Payer: Self-pay

## 2020-06-17 ENCOUNTER — Emergency Department (HOSPITAL_COMMUNITY): Payer: 59

## 2020-06-17 ENCOUNTER — Emergency Department (HOSPITAL_COMMUNITY)
Admission: EM | Admit: 2020-06-17 | Discharge: 2020-06-17 | Disposition: A | Discharge status: Home / Self Care | Payer: 59 | Attending: Emergency Medicine | Admitting: Emergency Medicine

## 2020-06-17 ENCOUNTER — Encounter (HOSPITAL_COMMUNITY): Payer: Self-pay | Admitting: Emergency Medicine

## 2020-06-17 DIAGNOSIS — R0600 Dyspnea, unspecified: Secondary | ICD-10-CM

## 2020-06-17 DIAGNOSIS — R0602 Shortness of breath: Secondary | ICD-10-CM | POA: Diagnosis not present

## 2020-06-17 DIAGNOSIS — R0789 Other chest pain: Secondary | ICD-10-CM | POA: Diagnosis present

## 2020-06-17 DIAGNOSIS — R110 Nausea: Secondary | ICD-10-CM | POA: Insufficient documentation

## 2020-06-17 DIAGNOSIS — R0989 Other specified symptoms and signs involving the circulatory and respiratory systems: Secondary | ICD-10-CM | POA: Insufficient documentation

## 2020-06-17 DIAGNOSIS — R1013 Epigastric pain: Secondary | ICD-10-CM | POA: Insufficient documentation

## 2020-06-17 LAB — BASIC METABOLIC PANEL
Anion gap: 12 (ref 5–15)
BUN: 11 mg/dL (ref 6–20)
CO2: 22 mmol/L (ref 22–32)
Calcium: 9.8 mg/dL (ref 8.9–10.3)
Chloride: 103 mmol/L (ref 98–111)
Creatinine, Ser: 0.67 mg/dL (ref 0.44–1.00)
GFR calc Af Amer: >60 mL/min (ref >60)
GFR calc non Af Amer: >60 mL/min (ref >60)
Glucose, Bld: 91 mg/dL (ref 70–99)
Potassium: 4.2 mmol/L (ref 3.5–5.1)
Sodium: 137 mmol/L (ref 135–145)

## 2020-06-17 LAB — CBC
HCT: 39.6 % (ref 36.0–46.0)
Hemoglobin: 13.4 g/dL (ref 12.0–15.0)
MCH: 30.1 pg (ref 26.0–34.0)
MCHC: 33.8 g/dL (ref 30.0–36.0)
MCV: 89 fL (ref 80.0–100.0)
Platelets: 393 10*3/uL (ref 150–400)
RBC: 4.45 MIL/uL (ref 3.87–5.11)
RDW: 12.7 % (ref 11.5–15.5)
WBC: 8.7 10*3/uL (ref 4.0–10.5)
nRBC: 0 % (ref 0.0–0.2)

## 2020-06-17 LAB — HEPATIC FUNCTION PANEL
ALT: 26 U/L (ref 0–44)
AST: 47 U/L — ABNORMAL HIGH (ref 15–41)
Albumin: 4.2 g/dL (ref 3.5–5.0)
Alkaline Phosphatase: 70 U/L (ref 38–126)
Bilirubin, Direct: 0.7 mg/dL — ABNORMAL HIGH (ref 0.0–0.2)
Indirect Bilirubin: 0.6 mg/dL (ref 0.3–0.9)
Total Bilirubin: 1.3 mg/dL — ABNORMAL HIGH (ref 0.3–1.2)
Total Protein: 7.1 g/dL (ref 6.5–8.1)

## 2020-06-17 LAB — I-STAT BETA HCG BLOOD, ED (MC, WL, AP ONLY): I-stat hCG, quantitative: <5 m[IU]/mL (ref <5)

## 2020-06-17 LAB — TROPONIN I (HIGH SENSITIVITY)
Troponin I (High Sensitivity): <2 ng/L (ref <18)
Troponin I (High Sensitivity): 3 ng/L (ref <18)

## 2020-06-17 LAB — TSH: TSH: 1.027 u[IU]/mL (ref 0.350–4.500)

## 2020-06-17 LAB — D-DIMER, QUANTITATIVE: D-Dimer, Quant: <0.27 ug/mL-FEU (ref 0.00–0.50)

## 2020-06-17 MED ORDER — LIDOCAINE VISCOUS HCL 2 % MT SOLN
15.0000 mL | Freq: Once | OROMUCOSAL | Status: AC
  Administered 2020-06-17: 15 mL via ORAL
  Filled 2020-06-17: qty 15

## 2020-06-17 MED ORDER — SODIUM CHLORIDE 0.9% FLUSH: 3.0000 mL | Freq: Once | INTRAVENOUS | Status: DC

## 2020-06-17 MED ORDER — FAMOTIDINE 20 MG PO TABS
20.0000 mg | ORAL_TABLET | Freq: Two times a day (BID) | ORAL | 0 refills | Status: DC
Start: 2020-06-17 — End: 2020-06-20

## 2020-06-17 MED ORDER — ALUM & MAG HYDROXIDE-SIMETH 200-200-20 MG/5ML PO SUSP
30.0000 mL | Freq: Once | ORAL | Status: AC
  Administered 2020-06-17: 30 mL via ORAL
  Filled 2020-06-17: qty 30

## 2020-06-17 NOTE — Telephone Encounter (Signed)
Patient went to the ED    Chief Complaint BREATHING - shortness of breath or sounds breathless Reason for Call Symptomatic / Request for [place] states she is having a hard time breathing. She has nausea and feels like she has a golf ball in her throat. Translation No Nurse Assessment Nurse: Raphael Gibney, RN, Vanita Ingles Date/Time (Eastern Time): 06/17/2020 9:59:52 AM Confirm and document reason for call. If symptomatic, describe symptoms. ---Caller states she feels like she has ping pong ball in her throat. Having difficulty swallowing. has nausea. She is having having trouble breathing. Has the patient had close contact with a person known or suspected to have the novel coronavirus illness OR traveled / lives in area with major community spread (including international travel) in the last 14 days from the onset of symptoms? * If Asymptomatic, screen for exposure and travel within the last 14 days. ---No Does the patient have any new or worsening symptoms? ---Yes Will a triage be completed? ---Yes Related visit to physician within the last 2 weeks? ---No Does the PT have any chronic conditions? (i.e. diabetes, asthma, this includes High risk factors for pregnancy, etc.) ---Yes List chronic conditions. ---fibromyalgia Is the patient pregnant or possibly pregnant? (Ask all females between the ages of 58-55) ---No Is this a behavioral health or substance abuse call? ---No Guidelines Guideline Title Affirmed Question Affirmed Notes Nurse Date/Time Eilene Ghazi Time) Breathing Difficulty [1] MODERATE difficulty breathing Raphael Gibney, RN, Vanita Ingles 06/17/2020 10:04:00 AMPLEASE NOTE: All timestamps contained within this report are represented as [place] Standard Time. CONFIDENTIALTY NOTICE: This fax transmission is intended only for the addressee. It contains information that is legally privileged, confidential or otherwise protected from use or disclosure. If you are not  the intended recipient, you are strictly prohibited from reviewing, disclosing, copying using or disseminating any of this information or taking any action in reliance on or regarding this information. If you have received this fax in error, please notify us immediately by telephone so that we can arrange for its return to [place]. Phone: [phone], Toll-Free: [phone], Fax: [phone] Page: 2 of 2 Call Id: [id number] Guidelines Guideline Title Affirmed Question Affirmed Notes Nurse Date/Time Eilene Ghazi Time) (e.g., speaks in phrases, SOB even at rest, pulse 100-120) AND [2] NEWonset or WORSE than normal Disp. Time Eilene Ghazi Time) Disposition Final User 06/17/2020 9:56:42 AM Send to Urgent Queue Rica Mote 06/17/2020 10:05:50 AM Go to ED Now Yes Raphael Gibney, RN, Doreatha Lew Disagree/Comply Comply Caller Understands Yes PreDisposition Call Doctor Care Advice Given Per Guideline GO TO

## 2020-06-17 NOTE — ED Provider Notes (Signed)
[facility] Provider Note   CSN: [id number] Arrival date & time: 06/17/20  1032     History Chief Complaint  Patient presents with  . Shortness of Breath    Savannah George is a 39 y.o. female.  Patient is a 39 year old female with a history of fibromyalgia, DVT, hypercholesterolemia who is presenting today with complaint of chest discomfort, lump in the throat and shortness of breath.  Patient states been having the symptoms for a while now but they were significantly worse today.  She does notice the symptoms are worse in the morning especially with nausea and even though the nausea lasts all day long it is more pronounced in the morning.  For the last month she has been taking Motrin almost daily because of ongoing headaches.  This morning after she arrived at work she reported there is still thick lump in her throat with difficulty swallowing and feeling short of breath which prompted her to come to the emergency room.  She has not had any significant cough, fever or congestion.  She does have have some upper abdominal pain and has noticed more heartburn-like symptoms.  Patient has a follow-up appointment with her doctor next week but when symptoms worsen today she did not think she could wait.  She has been taking a lot of Pepto-Bismol and Tums.  She denies any change in weight or bowel movements.  No recent medication changes and she does take Zofran regularly for nausea.  No significant family history for heart disease and she has had several family members who have had blood clots and she herself had a DVT several years ago but unclear the cause.  No tobacco, alcohol or drug use.  The history is provided by the patient.  Shortness of Breath Severity:  Moderate      Past Medical History:  Diagnosis Date  . Abnormal Pap smear of cervix    age 84 or 20  . Anemia   . Anxiety   . Depression   . DVT (deep venous thrombosis) ([place])   . Migraines     . Sleep apnea   . Urinary incontinence     Patient Active Problem List   Diagnosis Date Noted  . IBS (irritable bowel syndrome) 03/16/2020  . Chronic fatigue 02/04/2019  . Fibromyalgia 02/04/2019  . OSA, without need for CPAP 02/04/2019  . IUD, Mirena, placed 12/25/18 02/04/2019  . Urinary incontinence 11/06/2018  . Sleep disorder 10/25/2018  . Morbid obesity ([place]) 10/25/2018  . Situational depression 10/25/2018  . Amenorrhea 10/25/2018  . Family history of ovarian cancer 10/25/2018  . Pure hypercholesterolemia 10/25/2018  . Seasonal allergies 10/25/2018    Past Surgical History:  Procedure Laterality Date  . CRYOTHERAPY     For abnormal pap smear, age 80 or 70  . INTRAUTERINE DEVICE INSERTION     PARAGUARD     OB History    Gravida  0   Para  0   Term  0   Preterm  0   AB  0   Living  0     SAB  0   TAB  0   Ectopic  0   Multiple  0   Live Births  0           Family History  Problem Relation Age of Onset  . Kidney cancer Mother   . Renal cancer Mother   . Diabetes Father   . Heart disease Father   .  Stroke Father   . Uterine cancer Sister   . Diabetes Paternal Aunt   . Heart disease Maternal Grandfather   . Heart attack Maternal Grandfather   . Diabetes Paternal Grandmother   . Diabetes Paternal Grandfather   . Stroke Paternal Grandfather   . Heart attack Paternal Grandfather   . Ovarian cancer Sister     Social History   Tobacco Use  . Smoking status: Never Smoker  . Smokeless tobacco: Never Used  Vaping Use  . Vaping Use: Never used  Substance Use Topics  . Alcohol use: Yes    Alcohol/week: 0.0 - 3.0 standard drinks  . Drug use: Not Currently    Home Medications Prior to Admission medications   Medication Sig Start Date End Date Taking? Authorizing Provider  amitriptyline (ELAVIL) 10 MG tablet Take 1 tablet (10 mg total) by mouth at bedtime. 03/16/20   Vivi Barrack, MD  Cholecalciferol (VITAMIN D PO) Take 2,000 Int'l  Units by mouth.    [provider]  ibuprofen (MOTRIN IB) 200 MG tablet Take 400 mg by mouth every 6 (six) hours as needed.    [provider]  levonorgestrel (MIRENA) 20 MCG/24HR IUD 1 each by Intrauterine route once.    [provider]  Multiple Vitamins-Minerals (MULTIVITAMIN PO) Take by mouth.    [provider]  ondansetron (ZOFRAN ODT) 8 MG disintegrating tablet Take 1 tablet (8 mg total) by mouth every 8 (eight) hours as needed for nausea or vomiting. 03/16/20   Vivi Barrack, MD  predniSONE (DELTASONE) 20 MG tablet Take 1 tablet (20 mg total) by mouth 2 (two) times daily with a meal. 03/16/20   Vivi Barrack, MD    Allergies    Aspirin and Latex  Review of Systems   Review of Systems  Respiratory: Positive for shortness of breath.   All other systems reviewed and are negative.   Physical Exam Updated Vital Signs BP 109/69 (BP Location: Right Arm)   Pulse 79   Temp 98.2 F (36.8 C) (Oral)   Resp 16   Ht 5\' 1"  (1.549 m)   Wt 98.4 kg   LMP 06/03/2020 (Approximate)   SpO2 100%   BMI 41.00 kg/m   Physical Exam Vitals and nursing note reviewed.  Constitutional:      General: She is not in acute distress.    Appearance: She is well-developed. She is obese.  HENT:     Head: Normocephalic and atraumatic.  Eyes:     Conjunctiva/sclera: Conjunctivae normal.     Pupils: Pupils are equal, round, and reactive to light.  Cardiovascular:     Rate and Rhythm: Normal rate and regular rhythm.     Heart sounds: No murmur heard.   Pulmonary:     Effort: Pulmonary effort is normal. No respiratory distress.     Breath sounds: Normal breath sounds. No wheezing or rales.     Comments: Mild central chest tenderness with palpation Chest:     Chest wall: Tenderness present.  Abdominal:     General: There is no distension.     Palpations: Abdomen is soft.     Tenderness: There is abdominal tenderness in the epigastric area. There is no guarding  or rebound.  Musculoskeletal:        General: No tenderness. Normal range of motion.     Cervical back: Normal range of motion and neck supple.     Right lower leg: No edema.     Left  lower leg: No edema.  Skin:    General: Skin is warm and dry.     Findings: No erythema or rash.  Neurological:     General: No focal deficit present.     Mental Status: She is alert and oriented to person, place, and time. Mental status is at baseline.  Psychiatric:        Mood and Affect: Mood normal.        Behavior: Behavior normal.        Thought Content: Thought content normal.     ED Results / Procedures / Treatments   Labs (all labs ordered are listed, but only abnormal results are displayed) Labs Reviewed  BASIC METABOLIC PANEL  CBC  TSH  D-DIMER, QUANTITATIVE (NOT AT [facility])  HEPATIC FUNCTION PANEL  I-STAT BETA HCG BLOOD, ED ([facility], [facility], [facility] ONLY)  TROPONIN I (HIGH SENSITIVITY)  TROPONIN I (HIGH SENSITIVITY)    EKG EKG Interpretation  Date/Time:  Friday June 17 2020 11:29:12 EDT Ventricular Rate:  95 PR Interval:  136 QRS Duration: 72 QT Interval:  378 QTC Calculation: 475 R Axis:   65 Text Interpretation: Normal sinus rhythm with sinus arrhythmia No significant change since last tracing Confirmed by Blanchie Dessert [phone]) on 06/17/2020 2:31:38 PM   Radiology DG Chest 2 View  Result Date: 06/17/2020 CLINICAL DATA:  Shortness of breath EXAM: CHEST - 2 VIEW COMPARISON:  02/03/2011 FINDINGS: The heart size and mediastinal contours are within normal limits. Low lung volumes. Minimal streaky bibasilar opacities, favor atelectasis. No pleural effusion or pneumothorax. The visualized skeletal structures are unremarkable. IMPRESSION: Low lung volumes with minimal streaky bibasilar opacities, favor atelectasis. Electronically Signed   By: Davina Poke D.O.   On: 06/17/2020 12:23    Procedures Procedures (including critical care time)  Medications Ordered in ED Medications  sodium  chloride flush (NS) 0.9 % injection 3 mL (3 mLs Intravenous Not Given 06/17/20 1332)  alum & mag hydroxide-simeth (MAALOX/MYLANTA) 200-200-20 MG/5ML suspension 30 mL (has no administration in time range)    And  lidocaine (XYLOCAINE) 2 % viscous mouth solution 15 mL (has no administration in time range)    ED Course  I have reviewed the triage vital signs and the nursing notes.  Pertinent labs & imaging results that were available during my care of the patient were reviewed by me and considered in my medical decision making (see chart for details).    MDM Rules/Calculators/A&P                          39 year old female presenting today with complaint of a lump in her throat, painful swallowing and shortness of breath.  Patient's symptoms sound most classic for reflux disease with the sensation of a lump in her throat, symptoms worse with laying down and increase Motrin use over the last month.  However patient is also moderate risk for PE as she has a prior history of DVT for unknown cause.  Patient saturations today are 100% on room air with normal heart rate and normal EKG.  Low suspicion for cardiac cause and symptoms have now been ongoing greater than 3 hours with a troponin of 2.  She has mild epigastric pain but no other abdominal discomfort concerning for lower abdominal process such as appendicitis or diverticulitis.  Will also check D-dimer and TSH. Patient's CBC, CMP without acute findings with low suspicion for pancreatitis or hepatitis.  Final Clinical Impression(s) / ED Diagnoses Final  diagnoses:  None    Rx / DC Orders ED Discharge Orders    None       Blanchie Dessert, MD 06/17/20 1635

## 2020-06-17 NOTE — Telephone Encounter (Signed)
Patient being seen at ER ...FYI

## 2020-06-17 NOTE — ED Triage Notes (Signed)
Pt reports sob that started this morning. Pt reports with her fibromyalgia she has had difficulty swallowing and nausea. "Feels like I can't take a deep breath and there is something stuck in my throat." Pt labored breathing, tachypneic in triage. Denies pain. Reports chest tightness. Denies throat closing feeling.

## 2020-06-17 NOTE — ED Notes (Signed)
Pt expresses no concerns. Pt verbalized understand of d/c paperwork

## 2020-06-20 ENCOUNTER — Other Ambulatory Visit: Payer: Self-pay

## 2020-06-20 ENCOUNTER — Encounter: Payer: Self-pay | Admitting: Family Medicine

## 2020-06-20 ENCOUNTER — Ambulatory Visit (INDEPENDENT_AMBULATORY_CARE_PROVIDER_SITE_OTHER): Payer: 59 | Admitting: Family Medicine

## 2020-06-20 VITALS — BP 130/88 | HR 96 | Temp 98.9°F | Ht 61.0 in | Wt 217.2 lb

## 2020-06-20 DIAGNOSIS — Z0001 Encounter for general adult medical examination with abnormal findings: Secondary | ICD-10-CM

## 2020-06-20 DIAGNOSIS — R5382 Chronic fatigue, unspecified: Secondary | ICD-10-CM

## 2020-06-20 DIAGNOSIS — R7303 Prediabetes: Secondary | ICD-10-CM | POA: Insufficient documentation

## 2020-06-20 DIAGNOSIS — M797 Fibromyalgia: Secondary | ICD-10-CM

## 2020-06-20 DIAGNOSIS — K219 Gastro-esophageal reflux disease without esophagitis: Secondary | ICD-10-CM | POA: Insufficient documentation

## 2020-06-20 DIAGNOSIS — R1310 Dysphagia, unspecified: Secondary | ICD-10-CM | POA: Diagnosis not present

## 2020-06-20 DIAGNOSIS — K589 Irritable bowel syndrome without diarrhea: Secondary | ICD-10-CM

## 2020-06-20 DIAGNOSIS — R739 Hyperglycemia, unspecified: Secondary | ICD-10-CM

## 2020-06-20 DIAGNOSIS — N912 Amenorrhea, unspecified: Secondary | ICD-10-CM

## 2020-06-20 MED ORDER — METFORMIN HCL ER 500 MG PO TB24: 500.0000 mg | ORAL_TABLET | Freq: Every day | ORAL | 3 refills | Status: DC

## 2020-06-20 NOTE — Assessment & Plan Note (Signed)
Will place referral to GI.  Will likely need endoscopic evaluation.

## 2020-06-20 NOTE — Patient Instructions (Signed)
It was very nice to see you today!  Please start the metformin.  I will place a referral for you to see GI.  Come back in 3 months, or sooner if needed.   Take care, Dr Jerline Pain  Please try these tips to maintain a healthy lifestyle:   Eat at least 3 REAL meals and 1-2 snacks per day.  Aim for no more than 5 hours between eating.  If you eat breakfast, please do so within one hour of getting up.    Each meal should contain half fruits/vegetables, one quarter protein, and one quarter carbs (no bigger than a computer mouse)   Cut down on sweet beverages. This includes juice, soda, and sweet tea.     Drink at least 1 glass of water with each meal and aim for at least 8 glasses per day   Exercise at least 150 minutes every week.    Preventive Care 49-95 Years Old, Female Preventive care refers to visits with your health care provider and lifestyle choices that can promote health and wellness. This includes:  A yearly physical exam. This may also be called an annual well check.  Regular dental visits and eye exams.  Immunizations.  Screening for certain conditions.  Healthy lifestyle choices, such as eating a healthy diet, getting regular exercise, not using drugs or products that contain nicotine and tobacco, and limiting alcohol use. What can I expect for my preventive care visit? Physical exam Your health care provider will check your:  Height and weight. This may be used to calculate body mass index (BMI), which tells if you are at a healthy weight.  Heart rate and blood pressure.  Skin for abnormal spots. Counseling Your health care provider may ask you questions about your:  Alcohol, tobacco, and drug use.  Emotional well-being.  Home and relationship well-being.  Sexual activity.  Eating habits.  Work and work Statistician.  Method of birth control.  Menstrual cycle.  Pregnancy history. What immunizations do I need?  Influenza (flu)  vaccine  This is recommended every year. Tetanus, diphtheria, and pertussis (Tdap) vaccine  You may need a Td booster every 10 years. Varicella (chickenpox) vaccine  You may need this if you have not been vaccinated. Zoster (shingles) vaccine  You may need this after age 58. Measles, mumps, and rubella (MMR) vaccine  You may need at least one dose of MMR if you were born in 1957 or later. You may also need a second dose. Pneumococcal conjugate (PCV13) vaccine  You may need this if you have certain conditions and were not previously vaccinated. Pneumococcal polysaccharide (PPSV23) vaccine  You may need one or two doses if you smoke cigarettes or if you have certain conditions. Meningococcal conjugate (MenACWY) vaccine  You may need this if you have certain conditions. Hepatitis A vaccine  You may need this if you have certain conditions or if you travel or work in places where you may be exposed to hepatitis A. Hepatitis B vaccine  You may need this if you have certain conditions or if you travel or work in places where you may be exposed to hepatitis B. Haemophilus influenzae type b (Hib) vaccine  You may need this if you have certain conditions. Human papillomavirus (HPV) vaccine  If recommended by your health care provider, you may need three doses over 6 months. You may receive vaccines as individual doses or as more than one vaccine together in one shot (combination vaccines). Talk with your health care  provider about the risks and benefits of combination vaccines. What tests do I need? Blood tests  Lipid and cholesterol levels. These may be checked every 5 years, or more frequently if you are over 38 years old.  Hepatitis C test.  Hepatitis B test. Screening  Lung cancer screening. You may have this screening every year starting at age 27 if you have a 30-pack-year history of smoking and currently smoke or have quit within the past 15 years.  Colorectal cancer  screening. All adults should have this screening starting at age 72 and continuing until age 74. Your health care provider may recommend screening at age 59 if you are at increased risk. You will have tests every 1-10 years, depending on your results and the type of screening test.  Diabetes screening. This is done by checking your blood sugar (glucose) after you have not eaten for a while (fasting). You may have this done every 1-3 years.  Mammogram. This may be done every 1-2 years. Talk with your health care provider about when you should start having regular mammograms. This may depend on whether you have a family history of breast cancer.  BRCA-related cancer screening. This may be done if you have a family history of breast, ovarian, tubal, or peritoneal cancers.  Pelvic exam and Pap test. This may be done every 3 years starting at age 76. Starting at age 61, this may be done every 5 years if you have a Pap test in combination with an HPV test. Other tests  Sexually transmitted disease (STD) testing.  Bone density scan. This is done to screen for osteoporosis. You may have this scan if you are at high risk for osteoporosis. Follow these instructions at home: Eating and drinking  Eat a diet that includes fresh fruits and vegetables, whole grains, lean protein, and low-fat dairy.  Take vitamin and mineral supplements as recommended by your health care provider.  Do not drink alcohol if: ? Your health care provider tells you not to drink. ? You are pregnant, may be pregnant, or are planning to become pregnant.  If you drink alcohol: ? Limit how much you have to 0-1 drink a day. ? Be aware of how much alcohol is in your drink. In the [place] one drink equals one 12 oz bottle of beer (355 mL), one 5 oz glass of wine (148 mL), or one 1 oz glass of hard liquor (44 mL). Lifestyle  Take daily care of your teeth and gums.  Stay active. Exercise for at least 30 minutes on 5 or more days  each week.  Do not use any products that contain nicotine or tobacco, such as cigarettes, e-cigarettes, and chewing tobacco. If you need help quitting, ask your health care provider.  If you are sexually active, practice safe sex. Use a condom or other form of birth control (contraception) in order to prevent pregnancy and STIs (sexually transmitted infections).  If told by your health care provider, take low-dose aspirin daily starting at age 52. What's next?  Visit your health care provider once a year for a well check visit.  Ask your health care provider how often you should have your eyes and teeth checked.  Stay up to date on all vaccines. This information is not intended to replace advice given to you by your health care provider. Make sure you discuss any questions you have with your health care provider. Document Revised: 08/21/2018 Document Reviewed: 08/21/2018 Elsevier Patient Education  2020 [employer].

## 2020-06-20 NOTE — Assessment & Plan Note (Addendum)
BMi 41.  She is working on diet and exercise would like to have medication to help with weight loss.  Start Metformin 500 mg daily.  Follow-up 3 months.

## 2020-06-20 NOTE — Assessment & Plan Note (Signed)
Stable

## 2020-06-20 NOTE — Assessment & Plan Note (Addendum)
Had extensive discussion with patient regarding fibromyalgia symptoms and supplements she is taking.  Doing better after starting over-the-counter supplements.  She will continue taking these.  She will follow-up in about 3 months.

## 2020-06-20 NOTE — Assessment & Plan Note (Addendum)
Last A1c 5.7.  Start Metformin 500 mg daily.  Follow-up 3 months.  Hopefully last weight loss with Metformin.  Discussed potential side effects.

## 2020-06-20 NOTE — Assessment & Plan Note (Signed)
Continue management per OB/GYN.  Doing with perimenopausal symptoms currently.

## 2020-06-20 NOTE — Progress Notes (Signed)
Chief Complaint:  Savannah George is a 39 y.o. female who presents today for her annual comprehensive physical exam.    Assessment/Plan:  Chronic Problems Addressed Today: Hyperglycemia Last A1c 5.7.  Start Metformin 500 mg daily.  Follow-up 3 months.  Hopefully last weight loss with Metformin.  Discussed potential side effects.  Dysphagia Will place referral to GI.  Will likely need endoscopic evaluation.  IBS (irritable bowel syndrome) Stable.   Fibromyalgia Had extensive discussion with patient regarding fibromyalgia symptoms and supplements she is taking.  Doing better after starting over-the-counter supplements.  She will continue taking these.  She will follow-up in about 3 months.  Morbid obesity ([place]) BMi 41.  She is working on diet and exercise would like to have medication to help with weight loss.  Start Metformin 500 mg daily.  Follow-up 3 months.  Amenorrhea Continue management per OB/GYN.  Doing with perimenopausal symptoms currently.  Preventative Healthcare: Up-to-date on Pap smear per OB/GYN.  Patient Counseling(The following topics were reviewed and/or handout was given):  -Nutrition: Stressed importance of moderation in sodium/caffeine intake, saturated fat and cholesterol, caloric balance, sufficient intake of fresh fruits, vegetables, and fiber.  -Stressed the importance of regular exercise.   -Substance Abuse: Discussed cessation/primary prevention of tobacco, alcohol, or other drug use; driving or other dangerous activities under the influence; availability of treatment for abuse.   -Injury prevention: Discussed safety belts, safety helmets, smoke detector, smoking near bedding or upholstery.   -Sexuality: Discussed sexually transmitted diseases, partner selection, use of condoms, avoidance of unintended pregnancy and contraceptive alternatives.   -Dental health: Discussed importance of regular tooth brushing, flossing, and dental visits.  -Health maintenance  and immunizations reviewed. Please refer to Health maintenance section.  Return to care in 1 year for next preventative visit.     Subjective:  HPI:  Patient was seen about 3 months ago.  We started her on low-dose amitriptyline to help with fibromyalgia.  She cannot tolerate due to changing her ability to stay focused.  She discontinued medication within a week of starting.    She has recently being watching lectures from a holistic medicine doctor for fibromyalgia and she started on Sleep Tonight OTC supplement and a different kind of multivitamin. She has noticed significant improvement in her symptoms since starting both of these. Has had significant improvement in fibromyalgia symptoms.   Recently went to the ED due to feeling like she had a golf ball stuck in her throat.  She had full work-up at that time which was essentially negative.  Over the last several weeks she has had progressively worsening dysphagia and sensation like food is getting stuck in her throat.  She has had some worsening nausea associated with this as well.  No abdominal pain.  No heartburn or reflux.  Her nausea has much worse and more debilitating over the last few weeks.  Usually happens first thing in the morning.  Zofran helps alleviate her symptoms however will make her groggy.  Other medications made her more groggy than the Zofran.  Lifestyle Diet: Not following any specific diet. Tries to get a lot of vegetables. Trying smaller portion.  Exercise: Limited due to fibromyalgia. Has been working on chair yoga.   Depression screen PHQ 2/9 08/19/2019  Decreased Interest 1  Down, Depressed, Hopeless 1  PHQ - 2 Score 2  Altered sleeping 3  Tired, decreased energy 3  Change in appetite 1  Feeling bad or failure about yourself  0  Trouble concentrating  2  Moving slowly or fidgety/restless 2  Suicidal thoughts 0  PHQ-9 Score 13  Difficult doing work/chores -    Health Maintenance Due  Topic Date Due  .  COVID-19 Vaccine (1) Never done     ROS: Per HPI, otherwise a complete review of systems was negative.   PMH:  The following were reviewed and entered/updated in epic: Past Medical History:  Diagnosis Date  . Abnormal Pap smear of cervix    age 3 or 60  . Anemia   . Anxiety   . Depression   . DVT (deep venous thrombosis) ([place])   . Fibroma   . Fibromyalgia 2019  . Migraines   . Sleep apnea   . Urinary incontinence    Patient Active Problem List   Diagnosis Date Noted  . Dysphagia 06/20/2020  . Hyperglycemia 06/20/2020  . IBS (irritable bowel syndrome) 03/16/2020  . Chronic fatigue 02/04/2019  . Fibromyalgia 02/04/2019  . OSA, without need for CPAP 02/04/2019  . IUD, Mirena, placed 12/25/18 02/04/2019  . Urinary incontinence 11/06/2018  . Sleep disorder 10/25/2018  . Morbid obesity ([place]) 10/25/2018  . Situational depression 10/25/2018  . Amenorrhea 10/25/2018  . Family history of ovarian cancer 10/25/2018  . Pure hypercholesterolemia 10/25/2018  . Seasonal allergies 10/25/2018   Past Surgical History:  Procedure Laterality Date  . CRYOTHERAPY     For abnormal pap smear, age 37 or 6  . INTRAUTERINE DEVICE INSERTION     PARAGUARD    Family History  Problem Relation Age of Onset  . Kidney cancer Mother   . Renal cancer Mother   . Diabetes Father   . Heart disease Father   . Stroke Father   . Uterine cancer Sister   . Diabetes Paternal Aunt   . Heart disease Maternal Grandfather   . Heart attack Maternal Grandfather   . Diabetes Paternal Grandmother   . Diabetes Paternal Grandfather   . Stroke Paternal Grandfather   . Heart attack Paternal Grandfather   . Ovarian cancer Sister     Medications- reviewed and updated Current Outpatient Medications  Medication Sig Dispense Refill  . Cholecalciferol (VITAMIN D PO) Take 2,000 Int'l Units by mouth daily.     Marland Kitchen ibuprofen (MOTRIN IB) 200 MG tablet Take 400 mg by mouth every 6 (six) hours as needed for moderate  pain.     Marland Kitchen levonorgestrel (MIRENA) 20 MCG/24HR IUD 1 each by Intrauterine route once.    . Multiple Vitamins-Minerals (MULTIVITAMIN PO) Take 1 tablet by mouth daily.     . ondansetron (ZOFRAN ODT) 8 MG disintegrating tablet Take 1 tablet (8 mg total) by mouth every 8 (eight) hours as needed for nausea or vomiting. 20 tablet 0  . OVER THE COUNTER MEDICATION Sleep Tonight Sleep Aide    . metFORMIN (GLUCOPHAGE XR) 500 MG 24 hr tablet Take 1 tablet (500 mg total) by mouth daily with breakfast. 90 tablet 3   No current facility-administered medications for this visit.    Allergies-reviewed and updated Allergies  Allergen Reactions  . Aspirin Hives  . Latex Hives    Social History   Socioeconomic History  . Marital status: Divorced    Spouse name: Not on file  . Number of children: Not on file  . Years of education: Not on file  . Highest education level: Master's degree (e.g., MA, MS, MEng, MEd, MSW, MBA)  Occupational History  . Occupation: Tree surgeon: [employer]  .  Smoking status: Never Smoker  . Smokeless tobacco: Never Used  Vaping Use  . Vaping Use: Never used  Substance and Sexual Activity  . Alcohol use: Yes    Alcohol/week: 0.0 - 3.0 standard drinks  . Drug use: Not Currently  . Sexual activity: Not Currently    Partners: Male, Female    Birth control/protection: I.U.D.  Other Topics Concern  . Not on file  Social History Narrative   Recently moved back to [place] after divorcing husband. Went to college here and still has friends here. Enjoys hiking. Trying to integrate herself back into the community.    Social Determinants of Health   Financial Resource Strain:   . Difficulty of Paying Living Expenses:   Food Insecurity:   . Worried About Charity fundraiser in the Last Year:   . Arboriculturist in the Last Year:   Transportation Needs:   . Film/video editor (Medical):   Marland Kitchen Lack of Transportation (Non-Medical):     Physical Activity:   . Days of Exercise per Week:   . Minutes of Exercise per Session:   Stress:   . Feeling of Stress :   Social Connections:   . Frequency of Communication with Friends and Family:   . Frequency of Social Gatherings with Friends and Family:   . Attends Religious Services:   . Active Member of Clubs or Organizations:   . Attends Archivist Meetings:   Marland Kitchen Marital Status:         Objective:  Physical Exam: BP 130/88   Pulse 96   Temp 98.9 F (37.2 C) (Temporal)   Ht 5\' 1"  (1.549 m)   Wt 217 lb 3.2 oz (98.5 kg)   LMP 06/03/2020 (Approximate)   SpO2 96%   BMI 41.04 kg/m   Body mass index is 41.04 kg/m. Wt Readings from Last 3 Encounters:  06/20/20 217 lb 3.2 oz (98.5 kg)  06/17/20 217 lb (98.4 kg)  03/04/20 214 lb (97.1 kg)   Gen: NAD, resting comfortably HEENT: TMs normal bilaterally. OP clear. No thyromegaly noted.  CV: RRR with no murmurs appreciated Pulm: NWOB, CTAB with no crackles, wheezes, or rhonchi GI: Normal bowel sounds present. Soft, Nontender, Nondistended. MSK: no edema, cyanosis, or clubbing noted Skin: warm, dry Neuro: CN2-12 grossly intact. Strength 5/5 in upper and lower extremities. Reflexes symmetric and intact bilaterally.  Psych: Normal affect and thought content     Deshaun Schou M. Jerline Pain, MD 06/20/2020 10:44 AM

## 2020-06-24 ENCOUNTER — Telehealth: Payer: Self-pay | Admitting: Internal Medicine

## 2020-06-24 ENCOUNTER — Telehealth: Payer: Self-pay | Admitting: Family Medicine

## 2020-06-24 NOTE — Telephone Encounter (Signed)
Pt called and asked if Dr. Jerline Pain could put in an order for her to get an endoscopy done. Pt states she reached out to GI and they are not able to get her in until August. Pt is saying she needs to be seen sooner due to having worsening symptoms. Pt went to ED but they refused to do an endoscopy on her. Please advise.

## 2020-06-24 NOTE — Telephone Encounter (Signed)
Notified patient I will talk to referral coordinator to see if we are able to get patient seen sooner.We will contact her Tuesday 06/28/2020

## 2020-06-24 NOTE — Telephone Encounter (Signed)
Pt states she is having problems swallowing, taking food longer to go down and she feels like there is a lump on her throat. Pt states her throat is sore, and her tongue is a little more white when she wakes up in the am. Pt also having issues with nausea. Discussed with pt 1st available is 06/20/20 @2pm  with Amy Esterwood PA, she is aware of appt and she has been added to the cancellation list.

## 2020-06-24 NOTE — Telephone Encounter (Signed)
Left voice message for patient to call clinic.  

## 2020-06-28 ENCOUNTER — Encounter: Payer: Self-pay | Admitting: Family Medicine

## 2020-06-28 NOTE — Telephone Encounter (Signed)
Patient wants to see if she can get a earlier appt with GI

## 2020-06-28 NOTE — Telephone Encounter (Signed)
I can try to send a referral to another GI office, but they most likely will not be able to get her in before August as well due to high pt volumes.

## 2020-06-28 NOTE — Telephone Encounter (Signed)
Notified patient she voices understanding 

## 2020-06-29 ENCOUNTER — Encounter: Payer: Self-pay | Admitting: Obstetrics and Gynecology

## 2020-06-29 ENCOUNTER — Telehealth: Payer: Self-pay

## 2020-06-29 NOTE — Telephone Encounter (Signed)
Left message for pt to return call to triage RN. 

## 2020-06-29 NOTE — Telephone Encounter (Signed)
Oliviarose, Punch  P [facility] Good morning,.   I really appreciated your recommendation for Dr. Juleen China. Sadly, she has left the practice and I miss her quite a bit.    Do you have another GP that you could recommend? I finally got in for a transfer of care with someone else in her office but I don't think he's a very good fit for me.    Thanks,  Gar Ponto

## 2020-06-29 NOTE — Telephone Encounter (Signed)
Spoke with pt. Pt calling to get recommendations for new PCP. Pt advised to see [facility] at [facility] and see Dr Ethlyn Gallery. Pt agreeable and will call and set up appt. Thankful for advice and recommendation.   Routing to Dr Talbert Nan for review.  Encounter closed.

## 2020-07-07 ENCOUNTER — Other Ambulatory Visit: Payer: Self-pay

## 2020-07-07 ENCOUNTER — Telehealth: Payer: Self-pay

## 2020-07-07 DIAGNOSIS — R1310 Dysphagia, unspecified: Secondary | ICD-10-CM

## 2020-07-07 NOTE — Telephone Encounter (Signed)
Pt is still experiencing difficulty swallowing. She has developed new symptoms, such as orange tongue, sores in throat, white in the mouth. She went to a [facility] and received a negative strep test. However, that Dr. Recommended she may have thrush and to see an ENT. Pt is requesting a referral to an ENT

## 2020-07-07 NOTE — Telephone Encounter (Signed)
Left detailed message for patient to call clinic. Referral placed

## 2020-07-18 NOTE — Progress Notes (Deleted)
Office Visit Note  Patient: Savannah George             Date of Birth: 23-Dec-1981           MRN: [id number]             PCP: Vivi Barrack, MD Referring: Inda Coke, [place] Visit Date: 08/01/2020 Occupation: @GUAROCC @  Subjective:  No chief complaint on file.   History of Present Illness: Savannah George is a 39 y.o. female ***   Activities of Daily Living:  Patient reports morning stiffness for *** {minute/hour:19697}.   Patient {ACTIONS;DENIES/REPORTS:21021675::"Denies"} nocturnal pain.  Difficulty dressing/grooming: {ACTIONS;DENIES/REPORTS:21021675::"Denies"} Difficulty climbing stairs: {ACTIONS;DENIES/REPORTS:21021675::"Denies"} Difficulty getting out of chair: {ACTIONS;DENIES/REPORTS:21021675::"Denies"} Difficulty using hands for taps, buttons, cutlery, and/or writing: {ACTIONS;DENIES/REPORTS:21021675::"Denies"}  No Rheumatology ROS completed.   PMFS History:  Patient Active Problem List   Diagnosis Date Noted  . Dysphagia 06/20/2020  . Hyperglycemia 06/20/2020  . IBS (irritable bowel syndrome) 03/16/2020  . Chronic fatigue 02/04/2019  . Fibromyalgia 02/04/2019  . OSA, without need for CPAP 02/04/2019  . IUD, Mirena, placed 12/25/18 02/04/2019  . Urinary incontinence 11/06/2018  . Sleep disorder 10/25/2018  . Morbid obesity ([place]) 10/25/2018  . Situational depression 10/25/2018  . Amenorrhea 10/25/2018  . Family history of ovarian cancer 10/25/2018  . Pure hypercholesterolemia 10/25/2018  . Seasonal allergies 10/25/2018    Past Medical History:  Diagnosis Date  . Abnormal Pap smear of cervix    age 35 or 56  . Anemia   . Anxiety   . Depression   . DVT (deep venous thrombosis) ([place])   . Fibroma   . Fibromyalgia 2019  . Migraines   . Sleep apnea   . Urinary incontinence     Family History  Problem Relation Age of Onset  . Kidney cancer Mother   . Renal cancer Mother   . Diabetes Father   . Heart disease Father   . Stroke Father   . Uterine cancer  Sister   . Diabetes Paternal Aunt   . Heart disease Maternal Grandfather   . Heart attack Maternal Grandfather   . Diabetes Paternal Grandmother   . Diabetes Paternal Grandfather   . Stroke Paternal Grandfather   . Heart attack Paternal Grandfather   . Ovarian cancer Sister    Past Surgical History:  Procedure Laterality Date  . CRYOTHERAPY     For abnormal pap smear, age 53 or 35  . INTRAUTERINE DEVICE INSERTION     PARAGUARD   Social History   Social History Narrative   Recently moved back to [place] after divorcing husband. Went to college here and still has friends here. Enjoys hiking. Trying to integrate herself back into the community.    Immunization History  Administered Date(s) Administered  . Influenza-Unspecified 11/11/2015, 09/07/2018  . Tdap 03/03/2019     Objective: Vital Signs: There were no vitals taken for this visit.   Physical Exam   Musculoskeletal Exam: ***  CDAI Exam: CDAI Score: -- Patient Global: --; Provider Global: -- Swollen: --; Tender: -- Joint Exam 08/01/2020   No joint exam has been documented for this visit   There is currently no information documented on the homunculus. Go to the Rheumatology activity and complete the homunculus joint exam.  Investigation: No additional findings.  Imaging: No results found.  Recent Labs: Lab Results  Component Value Date   WBC 8.7 06/17/2020   HGB 13.4 06/17/2020   PLT 393 06/17/2020   NA 137 06/17/2020   K 4.2  06/17/2020   CL 103 06/17/2020   CO2 22 06/17/2020   GLUCOSE 91 06/17/2020   BUN 11 06/17/2020   CREATININE 0.67 06/17/2020   BILITOT 1.3 (H) 06/17/2020   ALKPHOS 70 06/17/2020   AST 47 (H) 06/17/2020   ALT 26 06/17/2020   PROT 7.1 06/17/2020   ALBUMIN 4.2 06/17/2020   CALCIUM 9.8 06/17/2020   GFRAA >60 06/17/2020    Speciality Comments: No specialty comments available.  Procedures:  No procedures performed Allergies: Aspirin and Latex   Assessment / Plan:     Visit  Diagnoses: No diagnosis found.  Orders: No orders of the defined types were placed in this encounter.  No orders of the defined types were placed in this encounter.   Face-to-face time spent with patient was *** minutes. Greater than 50% of time was spent in counseling and coordination of care.  Follow-Up Instructions: No follow-ups on file.   Ofilia Neas, PA-C  Note - This record has been created using Dragon software.  Chart creation errors have been sought, but may not always  have been located. Such creation errors do not reflect on  the standard of medical care.

## 2020-07-20 ENCOUNTER — Encounter: Payer: Self-pay | Admitting: Physician Assistant

## 2020-07-20 ENCOUNTER — Ambulatory Visit: Payer: 59 | Admitting: Physician Assistant

## 2020-07-20 VITALS — BP 132/78 | HR 96 | Ht 61.5 in | Wt 218.2 lb

## 2020-07-20 DIAGNOSIS — B370 Candidal stomatitis: Secondary | ICD-10-CM | POA: Diagnosis not present

## 2020-07-20 DIAGNOSIS — R1310 Dysphagia, unspecified: Secondary | ICD-10-CM | POA: Diagnosis not present

## 2020-07-20 MED ORDER — NYSTATIN 100000 UNIT/ML MT SUSP
5.0000 mL | Freq: Four times a day (QID) | OROMUCOSAL | 0 refills | Status: DC
Start: 2020-07-20 — End: 2021-01-06

## 2020-07-20 NOTE — Patient Instructions (Addendum)
If you are age 40 or older, your body mass index should be between 23-30. Your Body mass index is 40.57 kg/m. If this is out of the aforementioned range listed, please consider follow up with your Primary Care Provider.  If you are age 68 or younger, your body mass index should be between 19-25. Your Body mass index is 40.57 kg/m. If this is out of the aformentioned range listed, please consider follow up with your Primary Care Provider.   You have been scheduled for an endoscopy. Please follow written instructions given to you at your visit today. If you use inhalers (even only as needed), please bring them with you on the day of your procedure.  _  _   ORAL DIABETIC MEDICATION INSTRUCTIONS  The day before your procedure:  Take your diabetic pill as you do normally  The day of your procedure:  Do not take your diabetic pill   We will check your blood sugar levels during the admission process and again in Recovery before discharging you home   START Nystatin oral suspension use 5 ml swish and then swallow 4 times a day for 2 weeks.  Follow up pending your Endoscopy.

## 2020-07-20 NOTE — Progress Notes (Signed)
Subjective:    Patient ID: Savannah George, female    DOB: 1981/01/17, 39 y.o.   MRN: [id number]  HPI [name] is a pleasant 39 year old white female, new to GI today referred by Dr. Dimas Chyle for evaluation of complaints of dysphagia.  Patient relates having prior GI work-up in 2009 in [place].  She recalls having a gallbladder work-up and endoscopy.  That work-up was for nausea vomiting and abdominal pain. Patient has history of sleep apnea, IBS, fibromyalgia, and obesity.  She says her current symptoms started around 2 months ago.  She has had longer-term issues with early morning nausea which she attributes to her fibromyalgia.  She says when the fibromyalgia has flared up she will have more issues with nausea.  She uses Zofran for this as needed.  A couple of months ago she noted started noticing an irritated feeling in her throat and "scaly" feeling in her throat early in the mornings she also noted "white gunk" in her mouth and on her tongue in the mornings.  She had also noticed some changes in coloration of her tongue.  She has been feeling since then that solids have been somewhat slow to go down her esophagus.  She is also been having trouble with pills and has been grinding up her medicines.  Generally does okay with liquids.  She is not having any episodes requiring regurgitation.  No recent antibiotics, no changes in meds.  Denies any heartburn. She did have an ER visit in June 2021.  She says she started feeling difficulty getting a full breath.  She underwent initial cardiac evaluation which was negative.  She says while she was there she was having some difficulty swallowing and felt as if something may have been sticking her sitting in her esophagus.  Eventually she felt as if this "moved" and then felt bruised or irritated after that.  She also gets a sensation of fullness and discomfort in the supraclavicular area.  Review of Systems Pertinent positive and negative review of systems  were noted in the above HPI section.  All other review of systems was otherwise negative.  Outpatient Encounter Medications as of 07/20/2020  Medication Sig   Cholecalciferol (VITAMIN D PO) Take 2,000 Int'l Units by mouth daily.    ibuprofen (MOTRIN IB) 200 MG tablet Take 400 mg by mouth every 6 (six) hours as needed for moderate pain.    levonorgestrel (MIRENA) 20 MCG/24HR IUD 1 each by Intrauterine route once.   Multiple Vitamins-Minerals (MULTIVITAMIN PO) Take 1 tablet by mouth daily.    ondansetron (ZOFRAN ODT) 8 MG disintegrating tablet Take 1 tablet (8 mg total) by mouth every 8 (eight) hours as needed for nausea or vomiting.   OVER THE COUNTER MEDICATION Sleep Tonight Sleep Aide   nystatin (MYCOSTATIN) 100000 UNIT/ML suspension Take 5 mLs (500,000 Units total) by mouth 4 (four) times daily. Swish and swallow 4 times a day for 2 weeks   [DISCONTINUED] metFORMIN (GLUCOPHAGE XR) 500 MG 24 hr tablet Take 1 tablet (500 mg total) by mouth daily with breakfast.   No facility-administered encounter medications on file as of 07/20/2020.   Allergies  Allergen Reactions   Aspirin Hives   Latex Hives   Patient Active Problem List   Diagnosis Date Noted   Dysphagia 06/20/2020   Hyperglycemia 06/20/2020   IBS (irritable bowel syndrome) 03/16/2020   Chronic fatigue 02/04/2019   Fibromyalgia 02/04/2019   OSA, without need for CPAP 02/04/2019   IUD, Mirena, placed 12/25/18 02/04/2019  Urinary incontinence 11/06/2018   Sleep disorder 10/25/2018   Morbid obesity ([place]) 10/25/2018   Situational depression 10/25/2018   Amenorrhea 10/25/2018   Family history of ovarian cancer 10/25/2018   Pure hypercholesterolemia 10/25/2018   Seasonal allergies 10/25/2018   Social History   Socioeconomic History   Marital status: Divorced    Spouse name: Not on file   Number of children: 0   Years of education: Not on file   Highest education level: Master's degree (e.g., MA,  MS, MEng, MEd, MSW, MBA)  Occupational History   Occupation: Tree surgeon: [employer]  Tobacco Use   Smoking status: Never Smoker   Smokeless tobacco: Never Used  Scientific laboratory technician Use: Never used  Substance and Sexual Activity   Alcohol use: Yes    Alcohol/week: 0.0 - 3.0 standard drinks    Comment: occasional   Drug use: Not Currently   Sexual activity: Not Currently    Partners: Male, Female    Birth control/protection: I.U.D.  Other Topics Concern   Not on file  Social History Narrative   Recently moved back to [place] after divorcing husband. Went to college here and still has friends here. Enjoys hiking. Trying to integrate herself back into the community.    Social Determinants of Health   Financial Resource Strain:    Difficulty of Paying Living Expenses:   Food Insecurity:    Worried About Charity fundraiser in the Last Year:    Arboriculturist in the Last Year:   Transportation Needs:    Film/video editor (Medical):    Lack of Transportation (Non-Medical):   Physical Activity:    Days of Exercise per Week:    Minutes of Exercise per Session:   Stress:    Feeling of Stress :   Social Connections:    Frequency of Communication with Friends and Family:    Frequency of Social Gatherings with Friends and Family:    Attends Religious Services:    Active Member of Clubs or Organizations:    Attends Music therapist:    Marital Status:   Intimate Partner Violence:    Fear of Current or Ex-Partner:    Emotionally Abused:    Physically Abused:    Sexually Abused:     Ms. Michie family history includes Colon polyps in her father and sister; Diabetes in her father, paternal aunt, paternal grandfather, and paternal grandmother; Endometriosis in her sister; Heart attack in her paternal grandfather; Heart disease in her father and paternal grandmother; Hypertension in her father and sister; Kidney cancer  in her mother; Kidney disease in her maternal uncle; Other in her brother and sister; Ovarian cancer in her sister; Renal cancer in her mother; Stroke in her father and paternal grandfather; Uterine cancer in her sister.      Objective:    Vitals:   07/20/20 1401  BP: (!) 132/78  Pulse: 96    Physical Exam Well-developed well-nourished  female in no acute distress.  Height, [id number], BMI40  HEENT; nontraumatic normocephalic, EOMI, PEr R LA, sclera anicteric. Oropharynx; not examined Neck; supple, no JVD Cardiovascular; regular rate and rhythm with S1-S2, no murmur rub or gallop Pulmonary; Clear bilaterally Abdomen; soft, nontender, nondistended, no palpable mass or hepatosplenomegaly, bowel sounds are active Rectal; not done today Skin; benign exam, no jaundice rash or appreciable lesions Extremities; no clubbing cyanosis or edema skin warm and dry Neuro/Psych; alert and oriented x4, grossly nonfocal  mood and affect appropriate, appears anxious       Assessment & Plan:   #63 39 year old white female with 42-month history of solid food dysphagia and pill dysphagia.  She also describes esophageal irritation and pharyngeal irritation.  In addition she has noticed a thick substance in her mouth at times especially in the mornings. Etiology of symptoms is not entirely clear.  I suspect she may have mild oral candidiasis though no plaques on exam of the oropharynx. We will need to rule out esophageal stricture, versus motility disorder. Patient has also been having issues with anxiety which may be playing a role as well.  #2  Longstanding intermittent early morning nausea, associated with flares of fibromyalgia and response to Zofran. 3.  Fibromyalgia 4.  Morbid obesity 5.  IBS 6.  Obstructive sleep apnea  Plan start Mycostatin oral suspension 5 cc swish gargle and swallow 4 times daily x14 days. Patient will be scheduled for upper endoscopy with possible esophageal dilation with  Dr. Hilarie Fredrickson.  Procedure was discussed in detail with the patient including indications risks and benefits and she is agreeable to proceed. She has completed COVID-19 vaccination. Further plans pending results of above.   Tyde Lamison S Wilber Fini PA-C 07/20/2020   Cc: Vivi Barrack, MD

## 2020-08-01 ENCOUNTER — Ambulatory Visit: Payer: 59 | Admitting: Rheumatology

## 2020-08-02 ENCOUNTER — Encounter: Payer: Self-pay | Admitting: Internal Medicine

## 2020-08-02 NOTE — Progress Notes (Signed)
Addendum: Reviewed and agree with assessment and management plan. Zaylynn Rickett M, MD  

## 2020-08-09 ENCOUNTER — Other Ambulatory Visit: Payer: Self-pay

## 2020-08-09 ENCOUNTER — Encounter: Payer: Self-pay | Admitting: Internal Medicine

## 2020-08-09 ENCOUNTER — Ambulatory Visit (AMBULATORY_SURGERY_CENTER): Payer: 59 | Admitting: Internal Medicine

## 2020-08-09 VITALS — BP 132/82 | HR 73 | Temp 97.3°F | Resp 23 | Ht 61.0 in | Wt 218.0 lb

## 2020-08-09 DIAGNOSIS — R1310 Dysphagia, unspecified: Secondary | ICD-10-CM

## 2020-08-09 DIAGNOSIS — K209 Esophagitis, unspecified without bleeding: Secondary | ICD-10-CM

## 2020-08-09 DIAGNOSIS — K2100 Gastro-esophageal reflux disease with esophagitis, without bleeding: Secondary | ICD-10-CM | POA: Diagnosis not present

## 2020-08-09 MED ORDER — SODIUM CHLORIDE 0.9 % IV SOLN: 500.0000 mL | INTRAVENOUS | Status: DC

## 2020-08-09 NOTE — Progress Notes (Signed)
Called to room to assist during endoscopic procedure.  Patient ID and intended procedure confirmed with present staff. Received instructions for my participation in the procedure from the performing physician.  

## 2020-08-09 NOTE — Op Note (Signed)
[facility] Patient Name: Savannah George Procedure Date: 08/09/2020 2:56 PM MRN: [id number] Endoscopist: Jerene Bears , MD Age: 39 Referring MD:  Date of Birth: April 07, 1981 Gender: Female Account #: [id number] Procedure:                Upper GI endoscopy Indications:              Dysphagia Medicines:                Monitored Anesthesia Care Procedure:                Pre-Anesthesia Assessment:                           - Prior to the procedure, a History and Physical                            was performed, and patient medications and                            allergies were reviewed. The patient's tolerance of                            previous anesthesia was also reviewed. The risks                            and benefits of the procedure and the sedation                            options and risks were discussed with the patient.                            All questions were answered, and informed consent                            was obtained. Prior Anticoagulants: The patient has                            taken no previous anticoagulant or antiplatelet                            agents. ASA Grade Assessment: II - A patient with                            mild systemic disease. After reviewing the risks                            and benefits, the patient was deemed in                            satisfactory condition to undergo the procedure.                           After obtaining informed consent, the endoscope was  passed under direct vision. Throughout the                            procedure, the patient's blood pressure, pulse, and                            oxygen saturations were monitored continuously. The                            Endoscope was introduced through the mouth, and                            advanced to the second part of duodenum. The upper                            GI endoscopy was accomplished without difficulty.                             The patient tolerated the procedure well. Scope In: Scope Out: Findings:                 LA Grade A (one or more mucosal breaks less than 5                            mm, not extending between tops of 2 mucosal folds)                            esophagitis with no bleeding was found in the lower                            third of the esophagus and at the GE junction. The                            Z-line is normal at 39 cm. Dilation was performed                            with a Maloney dilator with no resistance at 50 Fr.                            The dilation site was examined and showed no                            change. After esophageal dilation biopsies were                            obtained from the proximal and distal esophagus                            with cold forceps for histology and to exclude                            eosinophilic esophagitis. The scope was withdrawn.  The entire examined stomach was normal.                           The examined duodenum was normal. Complications:            No immediate complications. Estimated Blood Loss:     Estimated blood loss was minimal. Impression:               - LA Grade A reflux esophagitis with no bleeding.                            Biopsied. Dilated.                           - Normal stomach.                           - Normal examined duodenum. Recommendation:           - Patient has a contact number available for                            emergencies. The signs and symptoms of potential                            delayed complications were discussed with the                            patient. Return to normal activities tomorrow.                            Written discharge instructions were provided to the                            patient.                           - Resume previous diet.                           - Continue present medications.                            - Begin pantoprazole 40 mg once daily given                            esophagitis. This medication should improve trouble                            swallowing and globus symptom. If symptoms do not                            improve please contact me.                           - Await pathology results. Jerene Bears, MD 08/09/2020 3:24:32 PM This report has been signed electronically.

## 2020-08-09 NOTE — Progress Notes (Signed)
V/s [place] 

## 2020-08-09 NOTE — Patient Instructions (Signed)
Handouts Provided:  Post Dilation Diet ? ?YOU HAD AN ENDOSCOPIC PROCEDURE TODAY AT THE [facility]:   Refer to the procedure report that was given to you for any specific questions about what was found during the examination.  If the procedure report does not answer your questions, please call your gastroenterologist to clarify.  If you requested that your care partner not be given the details of your procedure findings, then the procedure report has been included in a sealed envelope for you to review at your convenience later. ? ?YOU SHOULD EXPECT: Some feelings of bloating in the abdomen. Passage of more gas than usual.  Walking can help get rid of the air that was put into your GI tract during the procedure and reduce the bloating. If you had a lower endoscopy (such as a colonoscopy or flexible sigmoidoscopy) you may notice spotting of blood in your stool or on the toilet paper. If you underwent a bowel prep for your procedure, you may not have a normal bowel movement for a few days. ? ?Please Note:  You might notice some irritation and congestion in your nose or some drainage.  This is from the oxygen used during your procedure.  There is no need for concern and it should clear up in a day or so. ? ?SYMPTOMS TO REPORT IMMEDIATELY: ? ?Following upper endoscopy (EGD) ? Vomiting of blood or coffee ground material ? New chest pain or pain under the shoulder blades ? Painful or persistently difficult swallowing ? New shortness of breath ? Fever of 100?F or higher ? Black, tarry-looking stools ? ?For urgent or emergent issues, a gastroenterologist can be reached at any hour by calling [phone]. ?Do not use MyChart messaging for urgent concerns.  ? ? ?DIET:  See Post Dilation Diet provided.  Drink plenty of fluids but you should avoid alcoholic beverages for 24 hours. ? ?ACTIVITY:  You should plan to take it easy for the rest of today and you should NOT DRIVE or use heavy machinery until tomorrow  (because of the sedation medicines used during the test).   ? ?FOLLOW UP: ?Our staff will call the number listed on your records 48-72 hours following your procedure to check on you and address any questions or concerns that you may have regarding the information given to you following your procedure. If we do not reach you, we will leave a message.  We will attempt to reach you two times.  During this call, we will ask if you have developed any symptoms of COVID 19. If you develop any symptoms (ie: fever, flu-like symptoms, shortness of breath, cough etc.) before then, please call [phone].  If you test positive for Covid 19 in the 2 weeks post procedure, please call and report this information to us.   ? ?If any biopsies were taken you will be contacted by phone or by letter within the next 1-3 weeks.  Please call us at [phone] if you have not heard about the biopsies in 3 weeks.  ? ? ?SIGNATURES/CONFIDENTIALITY: ?You and/or your care partner have signed paperwork which will be entered into your electronic medical record.  These signatures attest to the fact that that the information above on your After Visit Summary has been reviewed and is understood.  Full responsibility of the confidentiality of this discharge information lies with you and/or your care-partner. ? ?

## 2020-08-09 NOTE — Progress Notes (Signed)
A and O x3. Report to RN. Tolerated MAC anesthesia well.Teeth unchanged after procedure.

## 2020-08-11 ENCOUNTER — Telehealth: Payer: Self-pay

## 2020-08-11 MED ORDER — PANTOPRAZOLE SODIUM 40 MG PO TBEC
40.0000 mg | DELAYED_RELEASE_TABLET | Freq: Every day | ORAL | 0 refills | Status: DC
Start: 2020-08-11 — End: 2021-04-10

## 2020-08-11 NOTE — Telephone Encounter (Signed)
  Follow up Call-  Call back number 08/09/2020  Post procedure Call Back phone  # [phone]  Permission to leave phone message Yes  Some recent data might be hidden     Patient questions:  Do you have a fever, pain , or abdominal swelling? No. Pain Score  0 *  Have you tolerated food without any problems? Yes.    Have you been able to return to your normal activities? Yes.    Do you have any questions about your discharge instructions: Diet   No. Medications  Yes.  -- Patient asked about prescription for new medication. Saw that Pantoprazole 40mg  daily was recommended but not order. Prescription sent.  Follow up visit  No.  Do you have questions or concerns about your Care? No.  Actions: * If pain score is 4 or above: No action needed, pain <4.    1. Have you developed a fever since your procedure? No   2.   Have you had an respiratory symptoms (SOB or cough) since your procedure? No   3.   Have you tested positive for COVID 19 since your procedure?  no   4.   Have you had any family members/close contacts diagnosed with the COVID 19 since your procedure?  No    If yes to any of these questions please route to Joylene John, RN and Joella Prince, RN

## 2020-08-12 ENCOUNTER — Encounter: Payer: Self-pay | Admitting: Internal Medicine

## 2020-09-22 ENCOUNTER — Ambulatory Visit: Payer: 59 | Admitting: Family Medicine

## 2020-09-30 ENCOUNTER — Other Ambulatory Visit: Payer: Self-pay

## 2020-09-30 ENCOUNTER — Telehealth: Payer: Self-pay | Admitting: Internal Medicine

## 2020-09-30 DIAGNOSIS — R1310 Dysphagia, unspecified: Secondary | ICD-10-CM

## 2020-09-30 MED ORDER — PANTOPRAZOLE SODIUM 40 MG PO TBEC
40.0000 mg | DELAYED_RELEASE_TABLET | Freq: Two times a day (BID) | ORAL | 2 refills | Status: DC
Start: 2020-09-30 — End: 2021-01-06

## 2020-09-30 NOTE — Telephone Encounter (Signed)
I would increase to panto 40 mg BID-AC x 6 weeks Barium esophagram with tablet to eval dysphagia despite recent egd with dilation If abnormal may need mano to def esophageal motility

## 2020-09-30 NOTE — Telephone Encounter (Signed)
Pt states she was told to call back with an update following her EGD and taking protonix. Pt reports she has finished the med but still having some issues. She reports she is still having the original symptoms but there is less pain. She is having less acid and nausea but she is having difficulty swallowing is the food is not baby food soft. Reports having the sensation that food is stuck after eating sometimes and afterwards her throat is sore and scratchy. Please advise.

## 2020-09-30 NOTE — Telephone Encounter (Signed)
Spoke with pt and she is and she is aware, script sent to pharmacy. Pt scheduled to have barium esophagram at [facility] 10/18/20@11am , pt to arrive there at 10:45am and be NPO after 8am. Pt aware of appt.

## 2020-10-18 ENCOUNTER — Other Ambulatory Visit: Payer: Self-pay

## 2020-10-18 ENCOUNTER — Ambulatory Visit (HOSPITAL_COMMUNITY)
Admission: RE | Admit: 2020-10-18 | Discharge: 2020-10-18 | Disposition: A | Discharge status: Home / Self Care | Payer: 59 | Source: Ambulatory Visit | Attending: Internal Medicine | Admitting: Internal Medicine

## 2020-10-18 DIAGNOSIS — R1310 Dysphagia, unspecified: Secondary | ICD-10-CM

## 2020-11-02 ENCOUNTER — Ambulatory Visit: Payer: 59 | Admitting: Certified Nurse Midwife

## 2020-11-03 ENCOUNTER — Ambulatory Visit: Payer: 59 | Admitting: Obstetrics and Gynecology

## 2020-11-10 NOTE — Progress Notes (Deleted)
PATIENT: Savannah George DOB: 1981/07/08  REASON FOR VISIT: follow up HISTORY FROM: patient  No chief complaint on file.    HISTORY OF PRESENT ILLNESS: Today 11/10/20 Camyla Camposano is a 39 y.o. female here today for follow up for OSA on CPAP.    Compliance report dated    HISTORY: (copied from previous note)  Tishia Maestre is a 39 y.o. female here today for follow up for OSA on CPAP. Sleep study revealed mild NREM and severe REM OSA. CPAP therapy started in . She returns today for initial CPAP compliance visit.  She is doing very well with CPAP therapy.  She has adjusted to the nasal mask.  She feels that she is sleeping more soundly.  It is much easier for her to fall back asleep if she wakes up.  Compliance report dated 10/12/2019 through 11/10/2019 reveals that she used CPAP 30 out of the last 30 days for compliance of 100%.  She used CPAP greater than 4 hours all 30 days for compliance of 100%.  Average usage was 7 hours and 54 minutes.  AHI was 0.2 on 6 to 12 cm of water and an EPR of 3.  There was no leak noted.    REVIEW OF SYSTEMS: Out of a complete 14 system review of symptoms, the patient complains only of the following symptoms, and all other reviewed systems are negative.  ALLERGIES: Allergies  Allergen Reactions   Aspirin Hives   Latex Hives    HOME MEDICATIONS: Outpatient Medications Prior to Visit  Medication Sig Dispense Refill   Cholecalciferol (VITAMIN D PO) Take 2,000 Int'l Units by mouth daily.      ibuprofen (MOTRIN IB) 200 MG tablet Take 400 mg by mouth every 6 (six) hours as needed for moderate pain.      levonorgestrel (MIRENA) 20 MCG/24HR IUD 1 each by Intrauterine route once.     Multiple Vitamins-Minerals (MULTIVITAMIN PO) Take 1 tablet by mouth daily.      Multiple Vitamins-Minerals (MULTIVITAMIN WITH MINERALS) tablet Take by mouth.     nystatin (MYCOSTATIN) 100000 UNIT/ML suspension Take 5 mLs (500,000 Units total) by mouth 4 (four) times  daily. Swish and swallow 4 times a day for 2 weeks 473 mL 0   ondansetron (ZOFRAN ODT) 8 MG disintegrating tablet Take 1 tablet (8 mg total) by mouth every 8 (eight) hours as needed for nausea or vomiting. 20 tablet 0   OVER THE COUNTER MEDICATION Sleep Tonight Sleep Aide     pantoprazole (PROTONIX) 40 MG tablet Take 1 tablet (40 mg total) by mouth daily. 90 tablet 0   pantoprazole (PROTONIX) 40 MG tablet Take 1 tablet (40 mg total) by mouth 2 (two) times daily before a meal. 60 tablet 2   No facility-administered medications prior to visit.    PAST MEDICAL HISTORY: Past Medical History:  Diagnosis Date   Abnormal Pap smear of cervix    age 49 or 49   Anemia    Anxiety    Depression    DVT (deep venous thrombosis) (HCC)    Fibroma    Fibromyalgia 2019   IBS (irritable bowel syndrome)    Kidney stones    Migraines    Pneumonia    Sleep apnea    BiPAP   Thyroid disease    Urinary incontinence     PAST SURGICAL HISTORY: Past Surgical History:  Procedure Laterality Date   CRYOTHERAPY     For abnormal pap smear, age 15 or 60  INTRAUTERINE DEVICE INSERTION     PARAGUARD    FAMILY HISTORY: Family History  Problem Relation Age of Onset   Kidney cancer Mother    Renal cancer Mother    Diabetes Father    Heart disease Father    Stroke Father    Colon polyps Father    Hypertension Father    Uterine cancer Sister    Colon polyps Sister    Endometriosis Sister    Hypertension Sister    Other Sister        Lung disease   Diabetes Paternal Aunt    Diabetes Paternal Grandmother    Heart disease Paternal Grandmother        CHF   Diabetes Paternal Grandfather    Stroke Paternal Grandfather    Heart attack Paternal Grandfather    Ovarian cancer Sister        hysterectomy   Other Brother        Lung disease   Kidney disease Maternal Uncle     SOCIAL HISTORY: Social History   Socioeconomic History   Marital status:  Divorced    Spouse name: Not on file   Number of children: 0   Years of education: Not on file   Highest education level: Master's degree (e.g., MA, MS, MEng, MEd, MSW, MBA)  Occupational History   Occupation: Tree surgeon: [employer]  Tobacco Use   Smoking status: Never Smoker   Smokeless tobacco: Never Used  Scientific laboratory technician Use: Never used  Substance and Sexual Activity   Alcohol use: Yes    Alcohol/week: 0.0 - 3.0 standard drinks    Comment: occasional   Drug use: Not Currently   Sexual activity: Not Currently    Partners: Male, Female    Birth control/protection: I.U.D.  Other Topics Concern   Not on file  Social History Narrative   Recently moved back to [place] after divorcing husband. Went to college here and still has friends here. Enjoys hiking. Trying to integrate herself back into the community.    Social Determinants of Health   Financial Resource Strain:    Difficulty of Paying Living Expenses: Not on file  Food Insecurity:    Worried About Charity fundraiser in the Last Year: Not on file   [employer] of Food in the Last Year: Not on file  Transportation Needs:    Lack of Transportation (Medical): Not on file   Lack of Transportation (Non-Medical): Not on file  Physical Activity:    Days of Exercise per Week: Not on file   Minutes of Exercise per Session: Not on file  Stress:    Feeling of Stress : Not on file  Social Connections:    Frequency of Communication with Friends and Family: Not on file   Frequency of Social Gatherings with Friends and Family: Not on file   Attends Religious Services: Not on file   Active Member of Clubs or Organizations: Not on file   Attends Archivist Meetings: Not on file   Marital Status: Not on file  Intimate Partner Violence:    Fear of Current or Ex-Partner: Not on file   Emotionally Abused: Not on file   Physically Abused: Not on file   Sexually Abused: Not  on file     PHYSICAL EXAM  There were no vitals filed for this visit. There is no height or weight on file to calculate BMI.  Generalized: Well developed, in  no acute distress  Cardiology: normal rate and rhythm, no murmur noted Respiratory: clear to auscultation bilaterally  Neurological examination  Mentation: Alert oriented to time, place, history taking. Follows all commands speech and language fluent Cranial nerve II-XII: Pupils were equal round reactive to light. Extraocular movements were full, visual field were full  Motor: The motor testing reveals 5 over 5 strength of all 4 extremities. Good symmetric motor tone is noted throughout.  Gait and station: Gait is normal.    DIAGNOSTIC DATA (LABS, IMAGING, TESTING) - I reviewed patient records, labs, notes, testing and imaging myself where available.  MMSE - Mini Mental State Exam 07/01/2019  Orientation to time 5  Orientation to Place 4  Registration 3  Attention/ Calculation 4  Recall 3  Language- name 2 objects 2  Language- repeat 1  Language- follow 3 step command 3  Language- read & follow direction 1  Write a sentence 1  Copy design 1  Total score 28     Lab Results  Component Value Date   WBC 8.7 06/17/2020   HGB 13.4 06/17/2020   HCT 39.6 06/17/2020   MCV 89.0 06/17/2020   PLT 393 06/17/2020      Component Value Date/Time   NA 137 06/17/2020 1054   NA 137 05/08/2018 0000   K 4.2 06/17/2020 1054   CL 103 06/17/2020 1054   CO2 22 06/17/2020 1054   GLUCOSE 91 06/17/2020 1054   BUN 11 06/17/2020 1054   BUN 13 05/08/2018 0000   CREATININE 0.67 06/17/2020 1054   CALCIUM 9.8 06/17/2020 1054   PROT 7.1 06/17/2020 1538   ALBUMIN 4.2 06/17/2020 1538   AST 47 (H) 06/17/2020 1538   ALT 26 06/17/2020 1538   ALKPHOS 70 06/17/2020 1538   BILITOT 1.3 (H) 06/17/2020 1538   GFRNONAA >60 06/17/2020 1054   GFRAA >60 06/17/2020 1054   Lab Results  Component Value Date   CHOL 228 (H) 05/08/2019   HDL 49.00  05/08/2019   LDLCALC 151 (H) 05/08/2019   TRIG 138.0 05/08/2019   CHOLHDL 5 05/08/2019   Lab Results  Component Value Date   HGBA1C 5.7 05/08/2019   Lab Results  Component Value Date   VITAMINB12 842 02/25/2018   Lab Results  Component Value Date   TSH 1.027 06/17/2020     ASSESSMENT AND PLAN 39 y.o. year old female  has a past medical history of Abnormal Pap smear of cervix, Anemia, Anxiety, Depression, DVT (deep venous thrombosis) ([place]), Fibroma, Fibromyalgia (2019), IBS (irritable bowel syndrome), Kidney stones, Migraines, Pneumonia, Sleep apnea, Thyroid disease, and Urinary incontinence. here with ***  No diagnosis found.   Sherrine Salberg is doing well on CPAP therapy. Compliance report reveals ***. She was encouraged to continue using CPAP nightly and for greater than 4 hours each night. We will update supply orders as indicated. Risks of untreated sleep apnea review and education materials provided. Healthy lifestyle habits encouraged. She will follow up in 1 year, sooner if needed. She verbalizes understanding and agreement with this plan.    No orders of the defined types were placed in this encounter.    No orders of the defined types were placed in this encounter.     I spent 15 minutes with the patient. 50% of this time was spent counseling and educating patient on plan of care and medications.    Debbora Presto, FNP-C 11/10/2020, 8:00 AM [facility] [address] [place], [place] [address] [phone]

## 2020-11-14 ENCOUNTER — Ambulatory Visit: Payer: 59 | Admitting: Family Medicine

## 2020-11-14 ENCOUNTER — Encounter: Payer: Self-pay | Admitting: Family Medicine

## 2020-11-14 DIAGNOSIS — G4733 Obstructive sleep apnea (adult) (pediatric): Secondary | ICD-10-CM

## 2020-12-25 NOTE — Progress Notes (Signed)
Office Visit Note  Patient: Savannah George             Date of Birth: Feb 01, 1981           MRN: [id number]             PCP: Vivi Barrack, MD Referring: Inda Coke, [place] Visit Date: 01/06/2021 Occupation: @GUAROCC @  Subjective:  Generalized pain.   History of Present Illness: Savannah George is a 40 y.o. female's with history of fibromyalgia syndrome.  She has been seen in consultation by her PCP for evaluation of any underlying autoimmune disease.  According the patient her symptoms a started in spring 2019 with increased fatigue and insomnia.  She states her symptoms of reflux and IBS also got worse at the time.  She had extensive work-up by her PCP.  She states the TSH was normal.  She switched PCP for evaluation.  And was diagnosed with fibromyalgia syndrome.  She states during the pandemic she could not go for follow-up visits.  She has a new PCP now and he wants to make sure that she does not have any underlying conditions.  She is not taking any treatment for fibromyalgia.  She states she has frequent flares of fibromyalgia when the symptoms are more severe.  She describes hyperalgesia, positive tender points and generalized fatigue.  She complains of left trapezius and left gluteal pain.  Also has some lower back pain.  There is no history of oral ulcers, nasal ulcers, malar rash, photosensitivity, sicca symptoms, Raynaud's phenomenon, lymphadenopathy or joint swelling.  There is no family history of autoimmune disease.  She is gravida 0.  Activities of Daily Living:  Patient reports morning stiffness for 30 minutes.   Patient Reports nocturnal pain.  Difficulty dressing/grooming: Reports Difficulty climbing stairs: Reports Difficulty getting out of chair: Reports Difficulty using hands for taps, buttons, cutlery, and/or writing: Denies  Review of Systems  Constitutional: Positive for fatigue.  HENT: Positive for mouth dryness and nose dryness. Negative for mouth sores.   Eyes:  Negative for pain, itching, visual disturbance and dryness.  Respiratory: Negative for cough, hemoptysis, shortness of breath and difficulty breathing.   Cardiovascular: Negative for chest pain, palpitations and swelling in legs/feet.  Gastrointestinal: Negative for abdominal pain, blood in stool, constipation and diarrhea.  Endocrine: Negative for increased urination.  Genitourinary: Negative for painful urination.  Musculoskeletal: Positive for myalgias, muscle weakness, morning stiffness, muscle tenderness and myalgias. Negative for arthralgias, joint pain and joint swelling.  Skin: Negative for color change, rash and redness.  Allergic/Immunologic: Negative for susceptible to infections.  Neurological: Positive for dizziness, numbness, headaches, memory loss and weakness.  Hematological: Negative for swollen glands.  Psychiatric/Behavioral: Positive for confusion and sleep disturbance.    PMFS History:  Patient Active Problem List   Diagnosis Date Noted  . Dysphagia 06/20/2020  . Hyperglycemia 06/20/2020  . IBS (irritable bowel syndrome) 03/16/2020  . Chronic fatigue 02/04/2019  . Fibromyalgia 02/04/2019  . OSA, without need for CPAP 02/04/2019  . IUD, Mirena, placed 12/25/18 02/04/2019  . Urinary incontinence 11/06/2018  . Sleep disorder 10/25/2018  . Morbid obesity ([place]) 10/25/2018  . Situational depression 10/25/2018  . Amenorrhea 10/25/2018  . Family history of ovarian cancer 10/25/2018  . Pure hypercholesterolemia 10/25/2018  . Seasonal allergies 10/25/2018    Past Medical History:  Diagnosis Date  . Abnormal Pap smear of cervix    age 56 or 42  . Anemia   . Anxiety   . Depression   .  DVT (deep venous thrombosis) ([place])   . Fibroma   . Fibromyalgia 2019  . IBS (irritable bowel syndrome)   . Kidney stones   . Migraines   . Pneumonia   . Sleep apnea    BiPAP  . Thyroid disease   . Urinary incontinence     Family History  Problem Relation Age of Onset  .  Kidney cancer Mother   . Renal cancer Mother   . Diabetes Father   . Heart disease Father   . Stroke Father   . Colon polyps Father   . Hypertension Father   . Uterine cancer Sister   . Colon polyps Sister   . Endometriosis Sister   . Hypertension Sister   . Other Sister        Lung disease  . Diabetes Paternal Aunt   . Diabetes Paternal Grandmother   . Heart disease Paternal Grandmother        CHF  . Diabetes Paternal Grandfather   . Stroke Paternal Grandfather   . Heart attack Paternal Grandfather   . Ovarian cancer Sister        hysterectomy  . Other Brother        Lung disease  . Kidney disease Maternal Uncle    Past Surgical History:  Procedure Laterality Date  . CRYOTHERAPY     For abnormal pap smear, age 21 or 51  . INTRAUTERINE DEVICE INSERTION     PARAGUARD   Social History   Social History Narrative   Recently moved back to [place] after divorcing husband. Went to college here and still has friends here. Enjoys hiking. Trying to integrate herself back into the community.    Immunization History  Administered Date(s) Administered  . Influenza-Unspecified 11/11/2015, 09/07/2018  . PFIZER SARS-COV-2 Vaccination 02/22/2020, 03/24/2020, 10/24/2020  . Tdap 03/03/2019     Objective: Vital Signs: BP 131/89 (BP Location: Right Arm, Patient Position: Sitting, Cuff Size: Normal)   Pulse 93   Ht 5' 2"  (1.575 m)   Wt 224 lb 6.4 oz (101.8 kg)   BMI 41.04 kg/m    Physical Exam Vitals and nursing note reviewed.  Constitutional:      Appearance: She is well-developed and well-nourished.  HENT:     Head: Normocephalic and atraumatic.  Eyes:     Extraocular Movements: EOM normal.     Conjunctiva/sclera: Conjunctivae normal.  Cardiovascular:     Rate and Rhythm: Normal rate and regular rhythm.     Pulses: Intact distal pulses.     Heart sounds: Normal heart sounds.  Pulmonary:     Effort: Pulmonary effort is normal.     Breath sounds: Normal breath sounds.   Abdominal:     General: Bowel sounds are normal.     Palpations: Abdomen is soft.  Musculoskeletal:     Cervical back: Normal range of motion.  Lymphadenopathy:     Cervical: No cervical adenopathy.  Skin:    General: Skin is warm and dry.     Capillary Refill: Capillary refill takes less than 2 seconds.  Neurological:     Mental Status: She is alert and oriented to person, place, and time.  Psychiatric:        Mood and Affect: Mood and affect normal.        Behavior: Behavior normal.      Musculoskeletal Exam: C-spine thoracic and lumbar spine with good range of motion.  She had left trapezius spasm.  She had lumbar paraspinal tenderness and left  gluteal tenderness.  Shoulder joints, elbow joints, wrist joints, MCPs and PIPs with good range of motion with no synovitis.  Hip joints, knee joints, ankles, MTPs and PIPs with good range of motion with no synovitis.  CDAI Exam: CDAI Score: -- Patient Global: --; Provider Global: -- Swollen: --; Tender: -- Joint Exam 01/06/2021   No joint exam has been documented for this visit   There is currently no information documented on the homunculus. Go to the Rheumatology activity and complete the homunculus joint exam.  Investigation: No additional findings.  Imaging: No results found.  Recent Labs: Lab Results  Component Value Date   WBC 8.7 06/17/2020   HGB 13.4 06/17/2020   PLT 393 06/17/2020   NA 137 06/17/2020   K 4.2 06/17/2020   CL 103 06/17/2020   CO2 22 06/17/2020   GLUCOSE 91 06/17/2020   BUN 11 06/17/2020   CREATININE 0.67 06/17/2020   BILITOT 1.3 (H) 06/17/2020   ALKPHOS 70 06/17/2020   AST 47 (H) 06/17/2020   ALT 26 06/17/2020   PROT 7.1 06/17/2020   ALBUMIN 4.2 06/17/2020   CALCIUM 9.8 06/17/2020   GFRAA >60 06/17/2020   June 17, 2020 CBC normal, CMP normal except AST 47, TSH normal March 03, 2019 magnesium normal February 03, 2019 ANA negative, ESR 11  Speciality Comments: No specialty comments  available.  Procedures:  No procedures performed Allergies: Aspirin and Latex   Assessment / Plan:     Visit Diagnoses: Fibromyalgia - ANA negative on 02/03/19.  Patient complains of generalized pain.  She gives episodes of increased discomfort.  She has generalized hyperalgesia and positive tender points.  She also has mild hypermobility.  She does not have any clinical features of autoimmune disease and no further work-up is needed.  She may benefit from the use of Cymbalta.  I advised her to discuss this further with her PCP.  I have also referred her to integrative therapies which would be helpful.  I discussed the benefits of water aerobics and stretching exercises and fibromyalgia.  Chronic fatigue -she complains of increased fatigue recently.  I will obtain following labs.  If lab results are normal we will cancel her follow-up appointment.  Plan: CK, Serum protein electrophoresis with reflex  Primary insomnia-she complains of chronic insomnia.  Good sleep hygiene was discussed.  OSA, without need for CPAP-patient states that she has been using a CPAP.  Chronic midline low back pain without sciatica-she complains of chronic lower back pain.  She has some discomfort in the paraspinal region.  There is no radiculopathy.  I have given her a handout on back exercises.  She may benefit from physical therapy as well.  GERD without esophagitis-she has chronic reflux issues.  She is on Protonix.  History of IBS  History of DVT (deep vein thrombosis) - 2015, on OCPs ttd with anticoagulants x 6 months.   Pure hypercholesterolemia  Situational depression-Cymbalta may help her depression situation as well.  BMI 40.0-44.9, adult (HCC)-she has gained a lot of weight in the last few years.  Weight loss should help her with fibromyalgia as well.  I have given her information on weight management clinic.  Patient intends to join the clinic.  Orders: Orders Placed This Encounter  Procedures  . CK   . Serum protein electrophoresis with reflex  . Ambulatory referral to Physical Therapy   No orders of the defined types were placed in this encounter.    Follow-Up Instructions: Return if symptoms worsen  or fail to improve, for myalgia.   Bo Merino, MD  Note - This record has been created using Editor, commissioning.  Chart creation errors have been sought, but may not always  have been located. Such creation errors do not reflect on  the standard of medical care.

## 2021-01-06 ENCOUNTER — Encounter: Payer: Self-pay | Admitting: Rheumatology

## 2021-01-06 ENCOUNTER — Ambulatory Visit: Payer: 59 | Admitting: Rheumatology

## 2021-01-06 ENCOUNTER — Other Ambulatory Visit: Payer: Self-pay

## 2021-01-06 VITALS — BP 131/89 | HR 93 | Ht 62.0 in | Wt 224.4 lb

## 2021-01-06 DIAGNOSIS — M797 Fibromyalgia: Secondary | ICD-10-CM | POA: Diagnosis not present

## 2021-01-06 DIAGNOSIS — G8929 Other chronic pain: Secondary | ICD-10-CM

## 2021-01-06 DIAGNOSIS — E7800 Pure hypercholesterolemia, unspecified: Secondary | ICD-10-CM

## 2021-01-06 DIAGNOSIS — K219 Gastro-esophageal reflux disease without esophagitis: Secondary | ICD-10-CM

## 2021-01-06 DIAGNOSIS — F5101 Primary insomnia: Secondary | ICD-10-CM | POA: Diagnosis not present

## 2021-01-06 DIAGNOSIS — Z8719 Personal history of other diseases of the digestive system: Secondary | ICD-10-CM

## 2021-01-06 DIAGNOSIS — M545 Low back pain, unspecified: Secondary | ICD-10-CM

## 2021-01-06 DIAGNOSIS — G4733 Obstructive sleep apnea (adult) (pediatric): Secondary | ICD-10-CM

## 2021-01-06 DIAGNOSIS — R5382 Chronic fatigue, unspecified: Secondary | ICD-10-CM

## 2021-01-06 DIAGNOSIS — F4321 Adjustment disorder with depressed mood: Secondary | ICD-10-CM

## 2021-01-06 DIAGNOSIS — Z6841 Body Mass Index (BMI) 40.0 and over, adult: Secondary | ICD-10-CM

## 2021-01-06 DIAGNOSIS — G479 Sleep disorder, unspecified: Secondary | ICD-10-CM

## 2021-01-06 DIAGNOSIS — Z86718 Personal history of other venous thrombosis and embolism: Secondary | ICD-10-CM

## 2021-01-06 NOTE — Patient Instructions (Signed)
Cervical Strain and Sprain Rehab Ask your health care provider which exercises are safe for you. Do exercises exactly as told by your health care provider and adjust them as directed. It is normal to feel mild stretching, pulling, tightness, or discomfort as you do these exercises. Stop right away if you feel sudden pain or your pain gets worse. Do not begin these exercises until told by your health care provider. Stretching and range-of-motion exercises Cervical side bending 1. Using good posture, sit on a stable chair or stand up. 2. Without moving your shoulders, slowly tilt your left / right ear to your shoulder until you feel a stretch in the opposite side neck muscles. You should be looking straight ahead. 3. Hold for __________ seconds. 4. Repeat with the other side of your neck. Repeat __________ times. Complete this exercise __________ times a day.   Cervical rotation 1. Using good posture, sit on a stable chair or stand up. 2. Slowly turn your head to the side as if you are looking over your left / right shoulder. ? Keep your eyes level with the ground. ? Stop when you feel a stretch along the side and the back of your neck. 3. Hold for __________ seconds. 4. Repeat this by turning to your other side. Repeat __________ times. Complete this exercise __________ times a day.   Thoracic extension and pectoral stretch 1. Roll a towel or a small blanket so it is about 4 inches (10 cm) in diameter. 2. Lie down on your back on a firm surface. 3. Put the towel lengthwise, under your spine in the middle of your back. It should not be under your shoulder blades. The towel should line up with your spine from your middle back to your lower back. 4. Put your hands behind your head and let your elbows fall out to your sides. 5. Hold for __________ seconds. Repeat __________ times. Complete this exercise __________ times a day. Strengthening exercises Isometric upper cervical flexion 1. Lie on  your back with a thin pillow behind your head and a small rolled-up towel under your neck. 2. Gently tuck your chin toward your chest and nod your head down to look toward your feet. Do not lift your head off the pillow. 3. Hold for __________ seconds. 4. Release the tension slowly. Relax your neck muscles completely before you repeat this exercise. Repeat __________ times. Complete this exercise __________ times a day. Isometric cervical extension 1. Stand about 6 inches (15 cm) away from a wall, with your back facing the wall. 2. Place a soft object, about 6-8 inches (15-20 cm) in diameter, between the back of your head and the wall. A soft object could be a small pillow, a ball, or a folded towel. 3. Gently tilt your head back and press into the soft object. Keep your jaw and forehead relaxed. 4. Hold for __________ seconds. 5. Release the tension slowly. Relax your neck muscles completely before you repeat this exercise. Repeat __________ times. Complete this exercise __________ times a day.   Posture and body mechanics Body mechanics refers to the movements and positions of your body while you do your daily activities. Posture is part of body mechanics. Good posture and healthy body mechanics can help to relieve stress in your body's tissues and joints. Good posture means that your spine is in its natural S-curve position (your spine is neutral), your shoulders are pulled back slightly, and your head is not tipped forward. The following are general guidelines  for applying improved posture and body mechanics to your everyday activities. Sitting 1. When sitting, keep your spine neutral and keep your feet flat on the floor. Use a footrest, if necessary, and keep your thighs parallel to the floor. Avoid rounding your shoulders, and avoid tilting your head forward. 2. When working at a desk or a computer, keep your desk at a height where your hands are slightly lower than your elbows. Slide your  chair under your desk so you are close enough to maintain good posture. 3. When working at a computer, place your monitor at a height where you are looking straight ahead and you do not have to tilt your head forward or downward to look at the screen.   Standing  When standing, keep your spine neutral and keep your feet about hip-width apart. Keep a slight bend in your knees. Your ears, shoulders, and hips should line up.  When you do a task in which you stand in one place for a long time, place one foot up on a stable object that is 2-4 inches (5-10 cm) high, such as a footstool. This helps keep your spine neutral.   Resting When lying down and resting, avoid positions that are most painful for you. Try to support your neck in a neutral position. You can use a contour pillow or a small rolled-up towel. Your pillow should support your neck but not push on it. This information is not intended to replace advice given to you by your health care provider. Make sure you discuss any questions you have with your health care provider. Document Revised: 04/01/2019 Document Reviewed: 09/10/2018 [employer] Patient Education  2021 [employer] Back Exercises The following exercises strengthen the muscles that help to support the trunk and back. They also help to keep the lower back flexible. Doing these exercises can help to prevent back pain or lessen existing pain.  If you have back pain or discomfort, try doing these exercises 2-3 times each day or as told by your health care provider.  As your pain improves, do them once each day, but increase the number of times that you repeat the steps for each exercise (do more repetitions).  To prevent the recurrence of back pain, continue to do these exercises once each day or as told by your health care provider. Do exercises exactly as told by your health care provider and adjust them as directed. It is normal to feel mild stretching, pulling, tightness, or  discomfort as you do these exercises, but you should stop right away if you feel sudden pain or your pain gets worse. Exercises Single knee to chest Repeat these steps 3-5 times for each leg: 5. Lie on your back on a firm bed or the floor with your legs extended. 6. Bring one knee to your chest. Your other leg should stay extended and in contact with the floor. 7. Hold your knee in place by grabbing your knee or thigh with both hands and hold. 8. Pull on your knee until you feel a gentle stretch in your lower back or buttocks. 9. Hold the stretch for 10-30 seconds. 10. Slowly release and straighten your leg. Pelvic tilt Repeat these steps 5-10 times: 6. Lie on your back on a firm bed or the floor with your legs extended. 7. Bend your knees so they are pointing toward the ceiling and your feet are flat on the floor. 8. Tighten your lower abdominal muscles to press your lower back against the   floor. This motion will tilt your pelvis so your tailbone points up toward the ceiling instead of pointing to your feet or the floor. 9. With gentle tension and even breathing, hold this position for 5-10 seconds. Cat-cow Repeat these steps until your lower back becomes more flexible: 5. Get into a hands-and-knees position on a firm surface. Keep your hands under your shoulders, and keep your knees under your hips. You may place padding under your knees for comfort. 6. Let your head hang down toward your chest. Contract your abdominal muscles and point your tailbone toward the floor so your lower back becomes rounded like the back of a cat. 7. Hold this position for 5 seconds. 8. Slowly lift your head, let your abdominal muscles relax and point your tailbone up toward the ceiling so your back forms a sagging arch like the back of a cow. 9. Hold this position for 5 seconds.   Press-ups Repeat these steps 5-10 times: 6. Lie on your abdomen (face-down) on the floor. 7. Place your palms near your head, about  shoulder-width apart. 8. Keeping your back as relaxed as possible and keeping your hips on the floor, slowly straighten your arms to raise the top half of your body and lift your shoulders. Do not use your back muscles to raise your upper torso. You may adjust the placement of your hands to make yourself more comfortable. 9. Hold this position for 5 seconds while you keep your back relaxed. 10. Slowly return to lying flat on the floor.   Bridges Repeat these steps 10 times: 4. Lie on your back on a firm surface. 5. Bend your knees so they are pointing toward the ceiling and your feet are flat on the floor. Your arms should be flat at your sides, next to your body. 6. Tighten your buttocks muscles and lift your buttocks off the floor until your waist is at almost the same height as your knees. You should feel the muscles working in your buttocks and the back of your thighs. If you do not feel these muscles, slide your feet 1-2 inches farther away from your buttocks. 7. Hold this position for 3-5 seconds. 8. Slowly lower your hips to the starting position, and allow your buttocks muscles to relax completely. If this exercise is too easy, try doing it with your arms crossed over your chest.   Abdominal crunches Repeat these steps 5-10 times: 1. Lie on your back on a firm bed or the floor with your legs extended. 2. Bend your knees so they are pointing toward the ceiling and your feet are flat on the floor. 3. Cross your arms over your chest. 4. Tip your chin slightly toward your chest without bending your neck. 5. Tighten your abdominal muscles and slowly raise your trunk (torso) high enough to lift your shoulder blades a tiny bit off the floor. Avoid raising your torso higher than that because it can put too much stress on your low back and does not help to strengthen your abdominal muscles. 6. Slowly return to your starting position. Back lifts Repeat these steps 5-10 times: 1. Lie on your  abdomen (face-down) with your arms at your sides, and rest your forehead on the floor. 2. Tighten the muscles in your legs and your buttocks. 3. Slowly lift your chest off the floor while you keep your hips pressed to the floor. Keep the back of your head in line with the curve in your back. Your eyes should be looking   at the floor. 4. Hold this position for 3-5 seconds. 5. Slowly return to your starting position. Contact a health care provider if:  Your back pain or discomfort gets much worse when you do an exercise.  Your worsening back pain or discomfort does not lessen within 2 hours after you exercise. If you have any of these problems, stop doing these exercises right away. Do not do them again unless your health care provider says that you can. Get help right away if:  You develop sudden, severe back pain. If this happens, stop doing the exercises right away. Do not do them again unless your health care provider says that you can. This information is not intended to replace advice given to you by your health care provider. Make sure you discuss any questions you have with your health care provider. Document Revised: 04/16/2019 Document Reviewed: 09/11/2018 Elsevier Patient Education  2021 [employer]  

## 2021-01-09 LAB — PROTEIN ELECTROPHORESIS, SERUM, WITH REFLEX
Albumin ELP: 4.3 g/dL (ref 3.8–4.8)
Alpha 1: 0.4 g/dL — ABNORMAL HIGH (ref 0.2–0.3)
Alpha 2: 1 g/dL — ABNORMAL HIGH (ref 0.5–0.9)
Beta 2: 0.4 g/dL (ref 0.2–0.5)
Beta Globulin: 0.5 g/dL (ref 0.4–0.6)
Gamma Globulin: 0.9 g/dL (ref 0.8–1.7)
Total Protein: 7.5 g/dL (ref 6.1–8.1)

## 2021-01-09 LAB — CK: Total CK: 169 U/L — ABNORMAL HIGH (ref 29–143)

## 2021-01-09 NOTE — Progress Notes (Signed)
CK is mildly elevated not significant.  SPEP is nonspecific.  Patient was to return only on as needed basis.  I will forward results to Dr. Jerline Pain.  Dr. Jerline Pain, please consider repeating CK in 6 months. Thank you, Bo Merino, MD

## 2021-02-08 ENCOUNTER — Ambulatory Visit: Payer: 59 | Admitting: Family Medicine

## 2021-02-16 ENCOUNTER — Encounter: Payer: Self-pay | Admitting: Family Medicine

## 2021-02-16 ENCOUNTER — Other Ambulatory Visit: Payer: Self-pay

## 2021-02-16 ENCOUNTER — Ambulatory Visit: Payer: 59 | Admitting: Family Medicine

## 2021-02-16 VITALS — BP 142/88 | HR 67 | Temp 98.3°F | Ht 62.0 in | Wt 224.8 lb

## 2021-02-16 DIAGNOSIS — F321 Major depressive disorder, single episode, moderate: Secondary | ICD-10-CM | POA: Diagnosis not present

## 2021-02-16 DIAGNOSIS — R739 Hyperglycemia, unspecified: Secondary | ICD-10-CM

## 2021-02-16 DIAGNOSIS — M797 Fibromyalgia: Secondary | ICD-10-CM | POA: Diagnosis not present

## 2021-02-16 MED ORDER — TIZANIDINE HCL 4 MG PO TABS: 4.0000 mg | ORAL_TABLET | Freq: Four times a day (QID) | ORAL | 1 refills | Status: DC | PRN

## 2021-02-16 MED ORDER — DULOXETINE HCL 30 MG PO CPEP: 30.0000 mg | ORAL_CAPSULE | Freq: Every day | ORAL | 3 refills | Status: DC

## 2021-02-16 NOTE — Assessment & Plan Note (Addendum)
Symptoms have worsened unfortunately since her last visit.  Unfortunately she has been dealing with the loss of her father in addition to having to deal with fibromyalgia flare.  She has currently on a wait list to see a therapist.  She has had therapist in the past but has not found a good fit yet.  Will start Cymbalta as above which should hopefully help with her mood as well.

## 2021-02-16 NOTE — Assessment & Plan Note (Signed)
Check A1c with her next blood draw.

## 2021-02-16 NOTE — Assessment & Plan Note (Signed)
Unfortunately symptoms have worsened since our last visit.  We tried amitriptyline in the past however she did not tolerate due to side effects.  She has recently seen rheumatology and had further autoimmune work-up all of which was essentially normal.  She has had much more fatigue muscle aches and has difficulty sleeping.  This has significantly impacted her ability to work as well.  We will start Cymbalta 30 mg daily.  She will check with me in 1 to 2 weeks via MyChart and we can adjust medications as needed.  At some point may consider trial of Lyrica or gabapentin or possibly alternative SNRI depending on her response to Cymbalta.

## 2021-02-16 NOTE — Patient Instructions (Signed)
It was very nice to see you today!  Please try the Cymbalta.  I will refill your Zanaflex.  Please send me a message in a few weeks to let me know how you are doing.  I would like to see you back in about 3 months or so.  Please come back to see me sooner if needed.  Take care, Dr Jerline Pain  Please try these tips to maintain a healthy lifestyle:   Eat at least 3 REAL meals and 1-2 snacks per day.  Aim for no more than 5 hours between eating.  If you eat breakfast, please do so within one hour of getting up.    Each meal should contain half fruits/vegetables, one quarter protein, and one quarter carbs (no bigger than a computer mouse)   Cut down on sweet beverages. This includes juice, soda, and sweet tea.     Drink at least 1 glass of water with each meal and aim for at least 8 glasses per day   Exercise at least 150 minutes every week.

## 2021-02-16 NOTE — Progress Notes (Signed)
   Savannah George is a 40 y.o. female who presents today for an office visit.  Assessment/Plan:  Chronic Problems Addressed Today: Fibromyalgia Unfortunately symptoms have worsened since our last visit.  We tried amitriptyline in the past however she did not tolerate due to side effects.  She has recently seen rheumatology and had further autoimmune work-up all of which was essentially normal.  She has had much more fatigue muscle aches and has difficulty sleeping.  This has significantly impacted her ability to work as well.  We will start Cymbalta 30 mg daily.  She will check with me in 1 to 2 weeks via MyChart and we can adjust medications as needed.  At some point may consider trial of Lyrica or gabapentin or possibly alternative SNRI depending on her response to Cymbalta.  Hyperglycemia Check A1c with her next blood draw.  Depression, major, single episode, moderate (Savannah George) Symptoms have worsened unfortunately since her last visit.  Unfortunately she has been dealing with the loss of her father in addition to having to deal with fibromyalgia flare.  She has currently on a wait list to see a therapist.  She has had therapist in the past but has not found a good fit yet.  Will start Cymbalta as above which should hopefully help with her mood as well.    Subjective:  HPI:  See A/p.        Objective:  Physical Exam: BP (!) 142/88   Pulse 67   Temp 98.3 F (36.8 C) (Temporal)   Ht 5\' 2"  (1.575 m)   Wt 224 lb 12.8 oz (102 kg)   SpO2 98%   BMI 41.12 kg/m   Gen: No acute distress, resting comfortably Neuro: Grossly normal, moves all extremities Psych: Normal affect and thought content      Savannah George M. Jerline Pain, MD 02/16/2021 3:22 PM

## 2021-02-17 ENCOUNTER — Encounter: Payer: Self-pay | Admitting: Family Medicine

## 2021-02-17 NOTE — Telephone Encounter (Signed)
See note

## 2021-02-20 ENCOUNTER — Encounter: Payer: Self-pay | Admitting: Family Medicine

## 2021-03-15 ENCOUNTER — Telehealth: Payer: Self-pay

## 2021-03-15 ENCOUNTER — Ambulatory Visit: Payer: 59 | Admitting: Family Medicine

## 2021-03-15 ENCOUNTER — Encounter: Payer: Self-pay | Admitting: Family Medicine

## 2021-03-15 ENCOUNTER — Other Ambulatory Visit: Payer: Self-pay

## 2021-03-15 VITALS — BP 114/77 | HR 79 | Temp 98.4°F | Ht 62.0 in | Wt 227.0 lb

## 2021-03-15 DIAGNOSIS — R5382 Chronic fatigue, unspecified: Secondary | ICD-10-CM

## 2021-03-15 DIAGNOSIS — M797 Fibromyalgia: Secondary | ICD-10-CM

## 2021-03-15 MED ORDER — SAVELLA 12.5 MG PO TABS: 12.5000 mg | ORAL_TABLET | Freq: Every day | ORAL | 0 refills | Status: DC

## 2021-03-15 NOTE — Assessment & Plan Note (Signed)
Unfortunately she was not able to tolerate Cymbalta.  She does feel like it helped a lot with her pain but had significant side effects including feeling empty and zaps all over her body.  She has tried decreasing the dose and spacing out the doses with no success.  Discussed treatment options.  We will try low-dose Savella for a few weeks and she will check with me in a couple weeks via MyChart.  If continues to have issues may consider trial of Lyrica.

## 2021-03-15 NOTE — Progress Notes (Signed)
   Savannah George is a 40 y.o. female who presents today for an office visit.  Assessment/Plan:  Chronic Problems Addressed Today: Fibromyalgia Unfortunately she was not able to tolerate Cymbalta.  She does feel like it helped a lot with her pain but had significant side effects including feeling empty and zaps all over her body.  She has tried decreasing the dose and spacing out the doses with no success.  Discussed treatment options.  We will try low-dose Savella for a few weeks and she will check with me in a couple weeks via MyChart.  If continues to have issues may consider trial of Lyrica.     Subjective:  HPI:  See a/p.         Objective:  Physical Exam: BP 114/77   Pulse 79   Temp 98.4 F (36.9 C) (Temporal)   Ht 5\' 2"  (1.575 m)   Wt 227 lb (103 kg)   SpO2 98%   BMI 41.52 kg/m   Gen: No acute distress, resting comfortably Neuro: Grossly normal, moves all extremities Psych: Normal affect and thought content      Kirby Argueta M. Jerline Pain, MD 03/15/2021 11:52 AM

## 2021-03-15 NOTE — Telephone Encounter (Signed)
Received a call from a nurse for the [facility], they received a fax from for the patient but they were not able to retrieve all of it. Wondering if we can re-fax it to [phone].

## 2021-03-15 NOTE — Patient Instructions (Signed)
It was very nice to see you today!  We will try Savella.  Please send a message in a couple weeks from and how this is working for you.  We will complete your FMLA paperwork.  Take care, Dr Jerline Pain  PLEASE NOTE:  If you had any lab tests please let us know if you have not heard back within a few days. You may see your results on mychart before we have a chance to review them but we will give you a call once they are reviewed by [place]. If we ordered any referrals today, please let us know if you have not heard from their office within the next week.   Please try these tips to maintain a healthy lifestyle:   Eat at least 3 REAL meals and 1-2 snacks per day.  Aim for no more than 5 hours between eating.  If you eat breakfast, please do so within one hour of getting up.    Each meal should contain half fruits/vegetables, one quarter protein, and one quarter carbs (no bigger than a computer mouse)   Cut down on sweet beverages. This includes juice, soda, and sweet tea.     Drink at least 1 glass of water with each meal and aim for at least 8 glasses per day   Exercise at least 150 minutes every week.

## 2021-03-16 NOTE — Telephone Encounter (Signed)
Form refax

## 2021-03-31 ENCOUNTER — Other Ambulatory Visit: Payer: Self-pay | Admitting: Family Medicine

## 2021-04-10 ENCOUNTER — Other Ambulatory Visit: Payer: Self-pay | Admitting: Internal Medicine

## 2021-04-11 NOTE — Progress Notes (Signed)
40 y.o. G0P0000 Divorced White or Caucasian Not Hispanic or Latino female here for annual exam.   She has a mirena IUD, inserted on 12/25/18. No bleeding. Still has cycle symptoms, no bleeding.  Her steady partner is a transgender female. Has had orchiectomy and has had vaginoplasty.  She is also sexually active with other women and men. Uses condoms with men. Doesn't share toys.   No bowel or bladder changes. She does void frequently.   Father died in 03-Feb-2023 of covid. It has been really hard for her. She is seeing a Social worker.   She struggles with fibromyalgia.     No LMP recorded. (Menstrual status: IUD).          Sexually active: Yes.    The current method of family planning is IUD.    Exercising: No.  The patient does not participate in regular exercise at present. Smoker:  no  Health Maintenance: Pap:  10-10-18 neg HPV HR neg  History of abnormal Pap:  Yes, cryosurgery age 54 or 19  MMG:  5-6 years ago per patient non in epic.  BMD:   none Colonoscopy: none TDaP:  2020 Gardasil: none    reports that she has never smoked. She has never used smokeless tobacco. She reports current alcohol use. She reports previous drug use. Just occasional ETOH. She is a Licensed conveyancer.   Past Medical History:  Diagnosis Date  . Abnormal Pap smear of cervix    age 40 or 41  . Anemia   . Anxiety   . Depression   . DVT (deep venous thrombosis) ([place])   . Fibroma   . Fibromyalgia 2019  . IBS (irritable bowel syndrome)   . Kidney stones   . Migraines   . Pneumonia   . Sleep apnea    BiPAP  . Thyroid disease   . Urinary incontinence     Past Surgical History:  Procedure Laterality Date  . CRYOTHERAPY     For abnormal pap smear, age 33 or 66  . INTRAUTERINE DEVICE INSERTION     PARAGUARD    Current Outpatient Medications  Medication Sig Dispense Refill  . Cholecalciferol (VITAMIN D PO) Take 2,000 Int'l Units by mouth daily.     Marland Kitchen ibuprofen (ADVIL) 200 MG tablet Take 400 mg by mouth every  6 (six) hours as needed for moderate pain.     Marland Kitchen levonorgestrel (MIRENA) 20 MCG/24HR IUD 1 each by Intrauterine route once.    . Multiple Vitamins-Minerals (MULTIVITAMIN PO) Take 1 tablet by mouth daily.     . ondansetron (ZOFRAN ODT) 8 MG disintegrating tablet Take 1 tablet (8 mg total) by mouth every 8 (eight) hours as needed for nausea or vomiting. 20 tablet 0  . OVER THE COUNTER MEDICATION Sleep Tonight Sleep Aide    . pantoprazole (PROTONIX) 40 MG tablet TAKE 1 TABLET (40 MG TOTAL) BY MOUTH 2 (TWO) TIMES DAILY BEFORE A MEAL. 60 tablet 2  . tiZANidine (ZANAFLEX) 4 MG tablet TAKE 1 TABLET BY MOUTH EVERY 6 HOURS AS NEEDED FOR MUSCLE SPASMS. 90 tablet 1   No current facility-administered medications for this visit.    Family History  Problem Relation Age of Onset  . Kidney cancer Mother   . Renal cancer Mother   . Diabetes Father   . Heart disease Father   . Stroke Father   . Colon polyps Father   . Hypertension Father   . Uterine cancer Sister   . Colon polyps Sister   .  Endometriosis Sister   . Hypertension Sister   . Other Sister        Lung disease  . Diabetes Paternal Aunt   . Diabetes Paternal Grandmother   . Heart disease Paternal Grandmother        CHF  . Diabetes Paternal Grandfather   . Stroke Paternal Grandfather   . Heart attack Paternal Grandfather   . Ovarian cancer Sister        hysterectomy  . Other Brother        Lung disease  . Kidney disease Maternal Uncle   Father died of Covid in Feb 02, 2023.  Sister with ovarian cancer at 17. Currently okay at 72. Doesn't think she had genetic testing.   Review of Systems  All other systems reviewed and are negative.   Exam:   BP 118/76   Pulse (!) 115   Ht 5\' 1"  (1.549 m)   Wt 224 lb (101.6 kg)   SpO2 97%   BMI 42.32 kg/m   Weight change: @WEIGHTCHANGE @ Height:   Height: 5\' 1"  (154.9 cm)  Ht Readings from Last 3 Encounters:  04/17/21 5\' 1"  (1.549 m)  03/15/21 5\' 2"  (1.575 m)  02/16/21 5\' 2"  (1.575 m)     General appearance: alert, cooperative and appears stated age Head: Normocephalic, without obvious abnormality, atraumatic Neck: no adenopathy, supple, symmetrical, trachea midline and thyroid normal to inspection and palpation Lungs: clear to auscultation bilaterally Cardiovascular: regular rate and rhythm Breasts: normal appearance, no masses or tenderness Abdomen: soft, non-tender; non distended,  no masses,  no organomegaly Extremities: extremities normal, atraumatic, no cyanosis or edema Skin: Skin color, texture, turgor normal. No rashes or lesions Lymph nodes: Cervical, supraclavicular, and axillary nodes normal. No abnormal inguinal nodes palpated Neurologic: Grossly normal   Pelvic: External genitalia:  no lesions              Urethra:  normal appearing urethra with no masses, tenderness or lesions              Bartholins and Skenes: normal                 Vagina: normal appearing vagina with normal color and discharge, no lesions              Cervix: no cervical motion tenderness, no lesions and friable with pap, IUD strings not seen               Bimanual Exam:  Uterus:  no masses or tenderness              Adnexa: no mass, fullness, tenderness               Rectovaginal: Confirms               Anus:  normal sphincter tone, no lesions  Gae Dry chaperoned for the exam.   1. Well woman exam Discussed breast self exam Discussed calcium and vit D intake Mammogram overdue, she will schedule She wasn't told she needed earlier colonoscopy with her FH of colon polyps, but she has limited information   2. Screening for cervical cancer - Cytology - PAP  3. Screening examination for STD (sexually transmitted disease) - RPR - HIV Antibody (routine testing w rflx) - Hepatitis C antibody - Cytology - PAP  4. Laboratory exam ordered as part of routine general medical examination Other labs UTD - Lipid panel  5. IUD check up Doing well  6. BMI 40.0-44.9, adult  ([place])  -  Lipid panel - Hemoglobin A1c  7. Immunization counseling Discussed gardasil, she will start the series - HPV 9-valent vaccine,Recombinat  8. Family history of ovarian cancer FH is complicated, she isn't sure but thinks she her sister had ovarian cancer - Ambulatory referral to Genetics  9. Intrauterine contraceptive device threads lost, initial encounter - US PELVIS TRANSVAGINAL NON-OB (TV ONLY); Future

## 2021-04-17 ENCOUNTER — Ambulatory Visit: Payer: 59 | Admitting: Obstetrics and Gynecology

## 2021-04-17 ENCOUNTER — Encounter: Payer: Self-pay | Admitting: Obstetrics and Gynecology

## 2021-04-17 ENCOUNTER — Ambulatory Visit (INDEPENDENT_AMBULATORY_CARE_PROVIDER_SITE_OTHER): Payer: 59 | Admitting: Obstetrics and Gynecology

## 2021-04-17 ENCOUNTER — Other Ambulatory Visit: Payer: Self-pay

## 2021-04-17 ENCOUNTER — Other Ambulatory Visit (HOSPITAL_COMMUNITY)
Admission: RE | Admit: 2021-04-17 | Discharge: 2021-04-17 | Disposition: A | Discharge status: Home / Self Care | Payer: 59 | Source: Ambulatory Visit | Attending: Obstetrics and Gynecology | Admitting: Obstetrics and Gynecology

## 2021-04-17 VITALS — BP 118/76 | HR 115 | Ht 61.0 in | Wt 224.0 lb

## 2021-04-17 DIAGNOSIS — Z8041 Family history of malignant neoplasm of ovary: Secondary | ICD-10-CM

## 2021-04-17 DIAGNOSIS — Z7185 Encounter for immunization safety counseling: Secondary | ICD-10-CM

## 2021-04-17 DIAGNOSIS — T8332XA Displacement of intrauterine contraceptive device, initial encounter: Secondary | ICD-10-CM

## 2021-04-17 DIAGNOSIS — Z124 Encounter for screening for malignant neoplasm of cervix: Secondary | ICD-10-CM | POA: Diagnosis not present

## 2021-04-17 DIAGNOSIS — Z01419 Encounter for gynecological examination (general) (routine) without abnormal findings: Secondary | ICD-10-CM

## 2021-04-17 DIAGNOSIS — Z0000 Encounter for general adult medical examination without abnormal findings: Secondary | ICD-10-CM

## 2021-04-17 DIAGNOSIS — Z6841 Body Mass Index (BMI) 40.0 and over, adult: Secondary | ICD-10-CM

## 2021-04-17 DIAGNOSIS — Z30431 Encounter for routine checking of intrauterine contraceptive device: Secondary | ICD-10-CM

## 2021-04-17 DIAGNOSIS — Z113 Encounter for screening for infections with a predominantly sexual mode of transmission: Secondary | ICD-10-CM | POA: Diagnosis not present

## 2021-04-17 DIAGNOSIS — Z23 Encounter for immunization: Secondary | ICD-10-CM | POA: Diagnosis not present

## 2021-04-17 NOTE — Patient Instructions (Signed)

## 2021-04-18 LAB — LIPID PANEL
Cholesterol: 235 mg/dL — ABNORMAL HIGH (ref <200)
HDL: 52 mg/dL (ref >=50)
LDL Cholesterol (Calc): 143 mg/dL (calc) — ABNORMAL HIGH
Non-HDL Cholesterol (Calc): 183 mg/dL (calc) — ABNORMAL HIGH (ref <130)
Total CHOL/HDL Ratio: 4.5 (calc) (ref <5.0)
Triglycerides: 260 mg/dL — ABNORMAL HIGH (ref <150)

## 2021-04-18 LAB — HEMOGLOBIN A1C
Hgb A1c MFr Bld: 5.9 % of total Hgb — ABNORMAL HIGH (ref <5.7)
Mean Plasma Glucose: 123 mg/dL
eAG (mmol/L): 6.8 mmol/L

## 2021-04-18 LAB — HIV ANTIBODY (ROUTINE TESTING W REFLEX): HIV 1&2 Ab, 4th Generation: NONREACTIVE

## 2021-04-18 LAB — HEPATITIS C ANTIBODY
Hepatitis C Ab: NONREACTIVE
SIGNAL TO CUT-OFF: 0 (ref <1.00)

## 2021-04-18 LAB — RPR: RPR Ser Ql: NONREACTIVE

## 2021-04-19 LAB — CYTOLOGY - PAP
Chlamydia: NEGATIVE
Comment: NEGATIVE
Comment: NEGATIVE
Comment: NEGATIVE
Comment: NORMAL
Diagnosis: NEGATIVE
High risk HPV: NEGATIVE
Neisseria Gonorrhea: NEGATIVE
Trichomonas: NEGATIVE

## 2021-04-24 ENCOUNTER — Encounter: Payer: Self-pay | Admitting: Family Medicine

## 2021-04-24 ENCOUNTER — Other Ambulatory Visit: Payer: Self-pay

## 2021-04-24 ENCOUNTER — Telehealth: Payer: Self-pay | Admitting: *Deleted

## 2021-04-24 ENCOUNTER — Telehealth: Payer: Self-pay | Admitting: Genetic Counselor

## 2021-04-24 ENCOUNTER — Ambulatory Visit: Payer: 59 | Admitting: Family Medicine

## 2021-04-24 VITALS — BP 118/79 | HR 95 | Temp 98.6°F | Ht 61.0 in | Wt 223.8 lb

## 2021-04-24 DIAGNOSIS — M797 Fibromyalgia: Secondary | ICD-10-CM

## 2021-04-24 DIAGNOSIS — R7303 Prediabetes: Secondary | ICD-10-CM | POA: Diagnosis not present

## 2021-04-24 MED ORDER — OZEMPIC (0.25 OR 0.5 MG/DOSE) 2 MG/1.5ML ~~LOC~~ SOPN: 0.2500 mg | PEN_INJECTOR | SUBCUTANEOUS | 5 refills | Status: DC

## 2021-04-24 NOTE — Assessment & Plan Note (Signed)
BMI 42.  We will start low-dose Ozempic today for her prediabetes but this should also help with weight loss.  She will follow-up with me in a few weeks.

## 2021-04-24 NOTE — Telephone Encounter (Signed)
(  Key: [id number]) Rx #: [id number] Ozempic (0.25 or 0.5 MG/DOSE) 2MG /1.5ML pen-injectors   Form [employer] Electronic Prior Authorization Form (2017 NCPDP) [place] send today  Waiting for determination

## 2021-04-24 NOTE — Progress Notes (Signed)
   Savannah George is a 40 y.o. female who presents today for an office visit.  Assessment/Plan:  Chronic Problems Addressed Today: Prediabetes A1c 5.9.  Discussed treatment options.  It is difficult for her to exercise much currently due to her fibromyalgia.  We discussed treatment options.  We will start low-dose Ozempic 0.25 mg weekly for 4 to 6 weeks.  Discussed potential side effects.  Fibromyalgia Overall stable.  She did not tolerate Savella.  She is also not tolerated amitriptyline or Cymbalta in the past.  She has been working with water therapy at the [facility] which has helped significantly.  She would like to continue with this and needs updated FMLA form for this.  Her fibromyalgia is making it difficult for her to exercise which is contributing to her obesity and prediabetes.  Hopefully she will be able to lose some weight with Ozempic which will help with her chronic pain due to fibromyalgia.  Morbid obesity (HCC) BMI 42.  We will start low-dose Ozempic today for her prediabetes but this should also help with weight loss.  She will follow-up with me in a few weeks.     Subjective:  HPI:  See A/P for status of chronic conditions.  She had visit with her gynecologist last week.  Found to have A1c 5.9.  She has been trying to work on weight loss though this has been difficult due to her fibromyalgia and chronic pain.       Objective:  Physical Exam: BP 118/79   Pulse 95   Temp 98.6 F (37 C) (Temporal)   Ht 5\' 1"  (1.549 m)   Wt 223 lb 12.8 oz (101.5 kg)   SpO2 99%   BMI 42.29 kg/m   Gen: No acute distress, resting comfortably Neuro: Grossly normal, moves all extremities Psych: Normal affect and thought content      Fatina Sprankle M. Jerline Pain, MD 04/24/2021 9:24 AM

## 2021-04-24 NOTE — Assessment & Plan Note (Signed)
Overall stable.  She did not tolerate Savella.  She is also not tolerated amitriptyline or Cymbalta in the past.  She has been working with water therapy at the [facility] which has helped significantly.  She would like to continue with this and needs updated FMLA form for this.  Her fibromyalgia is making it difficult for her to exercise which is contributing to her obesity and prediabetes.  Hopefully she will be able to lose some weight with Ozempic which will help with her chronic pain due to fibromyalgia.

## 2021-04-24 NOTE — Patient Instructions (Signed)
It was very nice to see you today!  We will start Ozempic.  Please take 0.25 mg weekly for the first 4 to 6 weeks.  Please check in with me in person or via MyChart after 6 weeks to let me know how you are doing.  We will update for FMLA paperwork.   Take care, Dr Jerline Pain  PLEASE NOTE:  If you had any lab tests please let us know if you have not heard back within a few days. You may see your results on mychart before we have a chance to review them but we will give you a call once they are reviewed by [place]. If we ordered any referrals today, please let us know if you have not heard from their office within the next week.   Please try these tips to maintain a healthy lifestyle:   Eat at least 3 REAL meals and 1-2 snacks per day.  Aim for no more than 5 hours between eating.  If you eat breakfast, please do so within one hour of getting up.    Each meal should contain half fruits/vegetables, one quarter protein, and one quarter carbs (no bigger than a computer mouse)   Cut down on sweet beverages. This includes juice, soda, and sweet tea.     Drink at least 1 glass of water with each meal and aim for at least 8 glasses per day   Exercise at least 150 minutes every week.

## 2021-04-24 NOTE — Assessment & Plan Note (Signed)
A1c 5.9.  Discussed treatment options.  It is difficult for her to exercise much currently due to her fibromyalgia.  We discussed treatment options.  We will start low-dose Ozempic 0.25 mg weekly for 4 to 6 weeks.  Discussed potential side effects.

## 2021-04-24 NOTE — Telephone Encounter (Signed)
Received a genetic counseling referral from Dr. Talbert Nan for fhx of ovarian cancer. Savannah George returned my call and has been scheduled to see Cari on 5/19 at 9am. Pt aware to arrive 15 minutes early.

## 2021-04-24 NOTE — Telephone Encounter (Signed)
Request Reference Number: [id number]. OZEMPIC INJ 2/1.5ML is approved through 04/24/2022. Your patient may now fill this prescription and it will be covered. Pharmacy notified

## 2021-05-01 ENCOUNTER — Telehealth: Payer: Self-pay

## 2021-05-01 NOTE — Telephone Encounter (Signed)
Patient called in today to check the status of FMLA paperwork

## 2021-05-03 NOTE — Telephone Encounter (Signed)
Patient states her employer is about to start working on the 3 month schedule, and without the FMLA they will not be able to accommodate her unless the FMLA is turned in.

## 2021-05-03 NOTE — Telephone Encounter (Signed)
Patient is calling in wanting to check in on this, as she hasnt heard anything.

## 2021-05-09 ENCOUNTER — Telehealth: Payer: Self-pay | Admitting: Genetic Counselor

## 2021-05-09 NOTE — Telephone Encounter (Signed)
Pt cld to reschedule her genetic counseling appt to 6/9 at 9am.

## 2021-05-11 ENCOUNTER — Inpatient Hospital Stay: Payer: 59 | Admitting: Genetic Counselor

## 2021-05-11 ENCOUNTER — Inpatient Hospital Stay: Payer: 59

## 2021-05-11 ENCOUNTER — Encounter: Payer: Self-pay | Admitting: Family Medicine

## 2021-05-11 NOTE — Telephone Encounter (Signed)
Patient states they have not received paper work

## 2021-05-11 NOTE — Telephone Encounter (Signed)
FMLA Form done and faxed  Patient notified

## 2021-05-12 NOTE — Telephone Encounter (Signed)
FMLA form faxed to [phone]

## 2021-05-13 ENCOUNTER — Encounter: Payer: Self-pay | Admitting: Family Medicine

## 2021-05-15 NOTE — Telephone Encounter (Signed)
See note

## 2021-05-19 ENCOUNTER — Other Ambulatory Visit: Payer: Self-pay

## 2021-05-19 MED ORDER — ONDANSETRON HCL 8 MG PO TABS: 8.0000 mg | ORAL_TABLET | Freq: Three times a day (TID) | ORAL | 0 refills | Status: DC | PRN

## 2021-05-30 ENCOUNTER — Telehealth: Payer: Self-pay | Admitting: Genetic Counselor

## 2021-05-30 NOTE — Telephone Encounter (Signed)
Received voicemail that Savannah George needs to reschedule genetics appointment for 6/9 due to not feeling well.  Returned call and LVM informing her that she can call me back to reschedule.

## 2021-05-31 NOTE — Telephone Encounter (Signed)
Left voice message to patient FMLA was faxed on 05/11/2021  Will faxed FMLA form and place a copy on form office to be pick up

## 2021-06-01 ENCOUNTER — Inpatient Hospital Stay: Payer: 59 | Admitting: Genetic Counselor

## 2021-06-01 ENCOUNTER — Inpatient Hospital Stay: Payer: 59

## 2021-06-08 ENCOUNTER — Ambulatory Visit (INDEPENDENT_AMBULATORY_CARE_PROVIDER_SITE_OTHER): Payer: 59 | Admitting: Family Medicine

## 2021-06-22 ENCOUNTER — Ambulatory Visit (INDEPENDENT_AMBULATORY_CARE_PROVIDER_SITE_OTHER): Payer: 59 | Admitting: Family Medicine

## 2021-07-06 ENCOUNTER — Encounter (INDEPENDENT_AMBULATORY_CARE_PROVIDER_SITE_OTHER): Payer: Self-pay | Admitting: Family Medicine

## 2021-07-06 ENCOUNTER — Other Ambulatory Visit: Payer: Self-pay

## 2021-07-06 ENCOUNTER — Ambulatory Visit (INDEPENDENT_AMBULATORY_CARE_PROVIDER_SITE_OTHER): Payer: 59 | Admitting: Family Medicine

## 2021-07-06 VITALS — BP 121/80 | HR 76 | Temp 98.1°F | Ht 62.0 in | Wt 207.0 lb

## 2021-07-06 DIAGNOSIS — E559 Vitamin D deficiency, unspecified: Secondary | ICD-10-CM | POA: Diagnosis not present

## 2021-07-06 DIAGNOSIS — Z9189 Other specified personal risk factors, not elsewhere classified: Secondary | ICD-10-CM

## 2021-07-06 DIAGNOSIS — R0602 Shortness of breath: Secondary | ICD-10-CM | POA: Diagnosis not present

## 2021-07-06 DIAGNOSIS — Z0289 Encounter for other administrative examinations: Secondary | ICD-10-CM

## 2021-07-06 DIAGNOSIS — F419 Anxiety disorder, unspecified: Secondary | ICD-10-CM

## 2021-07-06 DIAGNOSIS — F32A Depression, unspecified: Secondary | ICD-10-CM

## 2021-07-06 DIAGNOSIS — G4733 Obstructive sleep apnea (adult) (pediatric): Secondary | ICD-10-CM

## 2021-07-06 DIAGNOSIS — R5383 Other fatigue: Secondary | ICD-10-CM | POA: Diagnosis not present

## 2021-07-06 DIAGNOSIS — E7800 Pure hypercholesterolemia, unspecified: Secondary | ICD-10-CM

## 2021-07-06 DIAGNOSIS — Z9989 Dependence on other enabling machines and devices: Secondary | ICD-10-CM

## 2021-07-06 DIAGNOSIS — R7303 Prediabetes: Secondary | ICD-10-CM

## 2021-07-06 DIAGNOSIS — Z6837 Body mass index (BMI) 37.0-37.9, adult: Secondary | ICD-10-CM

## 2021-07-06 DIAGNOSIS — Z1331 Encounter for screening for depression: Secondary | ICD-10-CM

## 2021-07-07 LAB — COMPREHENSIVE METABOLIC PANEL
ALT: 47 IU/L — ABNORMAL HIGH (ref 0–32)
AST: 29 IU/L (ref 0–40)
Albumin/Globulin Ratio: 1.8 (ref 1.2–2.2)
Albumin: 4.6 g/dL (ref 3.8–4.8)
Alkaline Phosphatase: 91 IU/L (ref 44–121)
BUN/Creatinine Ratio: 23 (ref 9–23)
BUN: 15 mg/dL (ref 6–24)
Bilirubin Total: 0.4 mg/dL (ref 0.0–1.2)
CO2: 24 mmol/L (ref 20–29)
Calcium: 9.3 mg/dL (ref 8.7–10.2)
Chloride: 99 mmol/L (ref 96–106)
Creatinine, Ser: 0.64 mg/dL (ref 0.57–1.00)
Globulin, Total: 2.5 g/dL (ref 1.5–4.5)
Glucose: 86 mg/dL (ref 65–99)
Potassium: 4.9 mmol/L (ref 3.5–5.2)
Sodium: 137 mmol/L (ref 134–144)
Total Protein: 7.1 g/dL (ref 6.0–8.5)
eGFR: 114 mL/min/{1.73_m2} (ref >59)

## 2021-07-07 LAB — TSH: TSH: 1.21 u[IU]/mL (ref 0.450–4.500)

## 2021-07-07 LAB — CBC WITH DIFFERENTIAL/PLATELET
Basophils Absolute: 0.1 10*3/uL (ref 0.0–0.2)
Basos: 1 %
EOS (ABSOLUTE): 0.2 10*3/uL (ref 0.0–0.4)
Eos: 2 %
Hematocrit: 38.2 % (ref 34.0–46.6)
Hemoglobin: 12.7 g/dL (ref 11.1–15.9)
Immature Grans (Abs): 0 10*3/uL (ref 0.0–0.1)
Immature Granulocytes: 0 %
Lymphocytes Absolute: 2.2 10*3/uL (ref 0.7–3.1)
Lymphs: 24 %
MCH: 28.7 pg (ref 26.6–33.0)
MCHC: 33.2 g/dL (ref 31.5–35.7)
MCV: 86 fL (ref 79–97)
Monocytes Absolute: 0.6 10*3/uL (ref 0.1–0.9)
Monocytes: 6 %
Neutrophils Absolute: 6.2 10*3/uL (ref 1.4–7.0)
Neutrophils: 67 %
Platelets: 355 10*3/uL (ref 150–450)
RBC: 4.43 x10E6/uL (ref 3.77–5.28)
RDW: 13.8 % (ref 11.7–15.4)
WBC: 9.2 10*3/uL (ref 3.4–10.8)

## 2021-07-07 LAB — T4: T4, Total: 8.7 ug/dL (ref 4.5–12.0)

## 2021-07-07 LAB — VITAMIN B12: Vitamin B-12: 824 pg/mL (ref 232–1245)

## 2021-07-07 LAB — VITAMIN D 25 HYDROXY (VIT D DEFICIENCY, FRACTURES): Vit D, 25-Hydroxy: 55.8 ng/mL (ref 30.0–100.0)

## 2021-07-07 LAB — INSULIN, RANDOM: INSULIN: 15.8 u[IU]/mL (ref 2.6–24.9)

## 2021-07-07 LAB — FOLATE: Folate: 8 ng/mL (ref >3.0)

## 2021-07-07 LAB — T3: T3, Total: 115 ng/dL (ref 71–180)

## 2021-07-10 ENCOUNTER — Ambulatory Visit: Payer: 59 | Admitting: Family Medicine

## 2021-07-10 ENCOUNTER — Telehealth: Payer: Self-pay

## 2021-07-10 NOTE — Telephone Encounter (Signed)
Patient missed appt this morning, thought her appt was tomorrow. She is requesting a letter to be written for ADA with the city, states that the letter will be slightly more detailed than her FMLA. She has rescheduled her appt, but letter will need to be written before her appt next week. States she is available to talk to [place] and provide all needed info.

## 2021-07-10 NOTE — Telephone Encounter (Signed)
Please call pt
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
## 2021-07-12 NOTE — Telephone Encounter (Signed)
Can we see what is it the patient needs?  Savannah George. Jerline Pain, MD 07/12/2021 9:10 AM

## 2021-07-12 NOTE — Telephone Encounter (Signed)
Please advise 

## 2021-07-12 NOTE — Telephone Encounter (Signed)
Lm for pt tcb. 

## 2021-07-13 NOTE — Progress Notes (Signed)
Dear Dr. Talbert Nan,   Thank you for referring Savannah George to our clinic. The following note includes my evaluation and treatment recommendations.  Chief Complaint:   OBESITY Savannah George (MR# [id number]) is a 40 y.o. female who presents for evaluation and treatment of obesity and related comorbidities. Current BMI is Body mass index is 37.86 kg/m. Savannah George has been struggling with her weight for many years and has been unsuccessful in either losing weight, maintaining weight loss, or reaching her healthy weight goal.  Savannah George is currently in the action stage of change and ready to dedicate time achieving and maintaining a healthier weight. Savannah George is interested in becoming our patient and working on intensive lifestyle modifications including (but not limited to) diet and exercise for weight loss.  Referred by Dr. Talbert Nan. Savannah George is doing Door Savannah George take out 7-8 times a week. She reports large amounts of nausea and dizziness. 1 egg with toast or cheese toast or peanut butter toast (satisfied); 12-2 PM- fast food- sushi or [employer] chicken sandwich with fries and soda (eat and drink all) (satisfied); 7-9 PM noodles and egg or protein + vegetables with noodles.  Savannah George's habits were reviewed today and are as follows: her desired weight loss is 42 lbs, she started gaining weight after getting fibromyalgia, her heaviest weight ever was 248 pounds, she has significant food cravings issues, she skips meals frequently, she is frequently drinking liquids with calories, she frequently makes poor food choices, she frequently eats larger portions than normal, and she struggles with emotional eating.  Depression Screen Savannah George's Food and Mood (modified PHQ-9) score was 18.  Depression screen Savannah George 2/9 07/06/2021  Decreased Interest 3  Down, Depressed, Hopeless 3  PHQ - 2 Score 6  Altered sleeping 3  Tired, decreased energy 3  Change in appetite 1  Feeling bad or failure about yourself  1  Trouble  concentrating 3  Moving slowly or fidgety/restless 1  Suicidal thoughts 0  PHQ-9 Score 18  Difficult doing work/chores Extremely dIfficult   Subjective:   1. Other fatigue Savannah George admits to daytime somnolence and admits to waking up still tired. Patent has a history of symptoms of daytime fatigue and morning fatigue. Savannah George generally gets  6-8  hours of sleep per night, and states that she has poor sleep quality. Snoring is not present. Apneic episodes are present. Epworth Sleepiness Score is 7. EKG normal sinus rhythm at 75 bpm.  2. Shortness of breath on exertion Savannah George notes increasing shortness of breath with exercising and seems to be worsening over time with weight gain. She notes getting out of breath sooner with activity than she used to. This has gotten worse recently. [name] denies shortness of breath at rest or orthopnea. EKG normal sinus rhythm at 75 bpm.  3. Pre-diabetes Savannah George's last A1c was 5.9. She is on Ozempic 0.25 mg, prescribed by Dr. Jerline Pain- will discuss med in 4 days.  4. Pure hypercholesterolemia Her last LDL was 143, HDL 52, triglycerides 260, and total 235. She is not on a statin.  5. OSA on CPAP Savannah George has a CPAP and only doesn't wear it if she falls asleep without it. She has had CPAP for 2 years and reports improved sleep quality.  6. Vitamin D deficiency Pt denies nausea, vomiting, and muscle weakness but notes fatigue. She is on a multivitamin.  7. Anxiety and depression She has done quite a bit of emotional eating and reports she has done this her whole life.  8. At  risk for adverse drug event Savannah George is at risk for medication side effects.  Assessment/Plan:   1. Other fatigue Savannah George does feel that her weight is causing her energy to be lower than it should be. Fatigue may be related to obesity, depression or many other causes. Labs will be ordered, and in the meanwhile, Savannah George will focus on self care including making healthy food choices,  increasing physical activity and focusing on stress reduction. Check labs today.  - Vitamin B12 - CBC with Differential/Platelet - EKG 12-Lead - Folate - T3 - T4 - TSH  2. Shortness of breath on exertion Savannah George does feel that she gets out of breath more easily that she used to when she exercises. Savannah George's shortness of breath appears to be obesity related and exercise induced. She has agreed to work on weight loss and gradually increase exercise to treat her exercise induced shortness of breath. Will continue to monitor closely.  3. Pre-diabetes Savannah George will continue to work on weight loss, exercise, and decreasing simple carbohydrates to help decrease the risk of diabetes.  Check labs today.  - Comprehensive metabolic panel - Insulin, random  4. Pure hypercholesterolemia Cardiovascular risk and specific lipid/LDL goals reviewed.  We discussed several lifestyle modifications today and Savannah George will continue to work on diet, exercise and weight loss efforts. Orders and follow up as documented in patient record. Follow up labs in 3 months.  Counseling Intensive lifestyle modifications are the first line treatment for this issue. Dietary changes: Increase soluble fiber. Decrease simple carbohydrates. Exercise changes: Moderate to vigorous-intensity aerobic activity 150 minutes per week if tolerated. Lipid-lowering medications: see documented in medical record.  5. OSA on CPAP Intensive lifestyle modifications are the first line treatment for this issue. We discussed several lifestyle modifications today and she will continue to work on diet, exercise and weight loss efforts. We will continue to monitor. Orders and follow up as documented in patient record. Follow up at next appt.  6. Vitamin D deficiency Low Vitamin D level contributes to fatigue and are associated with obesity, breast, and colon cancer. She agrees to continue to take multivitamin and will follow-up for routine testing  of Vitamin D, at least 2-3 times per year to avoid over-replacement. Check labs today.  - VITAMIN D 25 Hydroxy (Vit-D Deficiency, Fractures)  7. Anxiety and depression Behavior modification techniques were discussed today to help Savannah George deal with her anxiety.  Orders and follow up as documented in patient record. Refer to Dr. Mallie Mussel.  8. Screening for depression Pachia had a positive depression screening. Depression is commonly associated with obesity and often results in emotional eating behaviors. We will monitor this closely and work on CBT to help improve the non-hunger eating patterns. Referral to Psychology may be required if no improvement is seen as she continues in our clinic.  9. At risk for adverse drug event Savannah George was given approximately 15 minutes of drug side effect counseling today.  We discussed side effect possibility and risk versus benefits. Savannah George agreed to the medication and will contact this office if these side effects are intolerable.  Repetitive spaced learning was employed today to elicit superior memory formation and behavioral change.   10. Class 2 severe obesity with serious comorbidity and body mass index (BMI) of 37.0 to 37.9 in adult, unspecified obesity type (HCC)  Savannah George is currently in the action stage of change and her goal is to continue with weight loss efforts. I recommend Savannah George begin the structured treatment plan as follows:  She has agreed to the Category 3 Plan.  Exercise goals: No exercise has been prescribed at this time.   Behavioral modification strategies: increasing lean protein intake, meal planning and cooking strategies, keeping healthy foods in the home, and planning for success.  She was informed of the importance of frequent follow-up visits to maximize her success with intensive lifestyle modifications for her multiple health conditions. She was informed we would discuss her lab results at her next visit unless there is a  critical issue that needs to be addressed sooner. Savannah George agreed to keep her next visit at the agreed upon time to discuss these results.  Objective:   Blood pressure 121/80, pulse 76, temperature 98.1 F (36.7 C), height 5\' 2"  (1.575 m), weight 207 lb (93.9 kg), SpO2 97 %. Body mass index is 37.86 kg/m.  EKG: Normal sinus rhythm, rate 75.  Indirect Calorimeter completed today shows a VO2 of ? and a REE of 1814.  Her calculated basal metabolic rate is 4097 thus her basal metabolic rate is better than expected.  General: Cooperative, alert, well developed, in no acute distress. HEENT: Conjunctivae and lids unremarkable. Cardiovascular: Regular rhythm.  Lungs: Normal work of breathing. Neurologic: No focal deficits.   Lab Results  Component Value Date   CREATININE 0.64 07/06/2021   BUN 15 07/06/2021   NA 137 07/06/2021   K 4.9 07/06/2021   CL 99 07/06/2021   CO2 24 07/06/2021   Lab Results  Component Value Date   ALT 47 (H) 07/06/2021   AST 29 07/06/2021   ALKPHOS 91 07/06/2021   BILITOT 0.4 07/06/2021   Lab Results  Component Value Date   HGBA1C 5.9 (H) 04/17/2021   HGBA1C 5.7 05/08/2019   Lab Results  Component Value Date   INSULIN 15.8 07/06/2021   Lab Results  Component Value Date   TSH 1.210 07/06/2021   Lab Results  Component Value Date   CHOL 235 (H) 04/17/2021   HDL 52 04/17/2021   LDLCALC 143 (H) 04/17/2021   TRIG 260 (H) 04/17/2021   CHOLHDL 4.5 04/17/2021   Lab Results  Component Value Date   WBC 9.2 07/06/2021   HGB 12.7 07/06/2021   HCT 38.2 07/06/2021   MCV 86 07/06/2021   PLT 355 07/06/2021    Attestation Statements:   Reviewed by clinician on day of visit: allergies, medications, problem list, medical history, surgical history, family history, social history, and previous encounter notes.  Coral Ceo, CMA, am acting as transcriptionist for Coralie Common, MD.   This is the patient's first visit at [facility]. The patient's NEW PATIENT PACKET was reviewed at length. Included in the packet: current and past health history, medications, allergies, ROS, gynecologic history (women only), surgical history, family history, social history, weight history, weight loss surgery history (for those that have had weight loss surgery), nutritional evaluation, mood and food questionnaire, PHQ9, Epworth questionnaire, sleep habits questionnaire, patient life and health improvement goals questionnaire. These will all be scanned into the patient's chart under media.   During the visit, I independently reviewed the patient's EKG, bioimpedance scale results, and indirect calorimeter results. I used this information to tailor a meal plan for the patient that will help her to lose weight and will improve her obesity-related conditions going forward. I performed a medically necessary appropriate examination and/or evaluation. I discussed the assessment and treatment plan with the patient. The patient was provided an opportunity to ask questions and all were answered. The  patient agreed with the plan and demonstrated an understanding of the instructions. Labs were ordered at this visit and will be reviewed at the next visit unless more critical results need to be addressed immediately. Clinical information was updated and documented in the EMR.   Time spent on visit including pre-visit chart review and post-visit care was 45 minutes.   A separate 15 minutes was spent on risk counseling (see above).  I have reviewed the above documentation for accuracy and completeness, and I agree with the above. - Coralie Common, MD

## 2021-07-14 NOTE — Telephone Encounter (Signed)
Left message to return call to our office at their convenience.  

## 2021-07-14 NOTE — Telephone Encounter (Signed)
Patient return call stated need a letter with symptoms and Dx to show the need of aqua therapy With out Therapy her Sx and mobility worsen

## 2021-07-16 NOTE — Telephone Encounter (Signed)
Ok to write letter stating she needs aqua therapy.  Algis Greenhouse. Jerline Pain, MD 07/16/2021 6:49 PM

## 2021-07-18 ENCOUNTER — Encounter: Payer: Self-pay | Admitting: Family Medicine

## 2021-07-18 NOTE — Telephone Encounter (Signed)
Letter done patient notified

## 2021-07-19 ENCOUNTER — Encounter (INDEPENDENT_AMBULATORY_CARE_PROVIDER_SITE_OTHER): Payer: Self-pay | Admitting: Family Medicine

## 2021-07-19 NOTE — Telephone Encounter (Signed)
Please advise FYI Appt is tomorrow @ 10:00

## 2021-07-19 NOTE — Telephone Encounter (Signed)
Left message to return call to our office at their convenience.  

## 2021-07-19 NOTE — Telephone Encounter (Signed)
Dr.Ukleja 

## 2021-07-20 ENCOUNTER — Ambulatory Visit (INDEPENDENT_AMBULATORY_CARE_PROVIDER_SITE_OTHER): Payer: 59 | Admitting: Family Medicine

## 2021-07-20 ENCOUNTER — Encounter (INDEPENDENT_AMBULATORY_CARE_PROVIDER_SITE_OTHER): Payer: Self-pay

## 2021-07-20 NOTE — Telephone Encounter (Signed)
Patient is returning a phone call from [place].

## 2021-07-21 ENCOUNTER — Encounter: Payer: Self-pay | Admitting: Family Medicine

## 2021-07-21 ENCOUNTER — Other Ambulatory Visit: Payer: Self-pay

## 2021-07-21 ENCOUNTER — Ambulatory Visit: Payer: 59 | Admitting: Family Medicine

## 2021-07-21 VITALS — BP 123/85 | HR 81 | Temp 98.1°F | Ht 62.0 in | Wt 209.8 lb

## 2021-07-21 DIAGNOSIS — M797 Fibromyalgia: Secondary | ICD-10-CM | POA: Diagnosis not present

## 2021-07-21 DIAGNOSIS — R7303 Prediabetes: Secondary | ICD-10-CM

## 2021-07-21 NOTE — Patient Instructions (Addendum)
It was very nice to see you today!  I am glad that you are doing better.  Please increase your ozempic to 0.'5mg'$  weekly.  Send me a message in a few weeks to let me know you are doing.   I will see you back in 3 months. Come back sooner if needed.  Take care, Dr Jerline Pain  PLEASE NOTE:  If you had any lab tests please let us know if you have not heard back within a few days. You may see your results on mychart before we have a chance to review them but we will give you a call once they are reviewed by [place]. If we ordered any referrals today, please let us know if you have not heard from their office within the next week.   Please try these tips to maintain a healthy lifestyle:  Eat at least 3 REAL meals and 1-2 snacks per day.  Aim for no more than 5 hours between eating.  If you eat breakfast, please do so within one hour of getting up.   Each meal should contain half fruits/vegetables, one quarter protein, and one quarter carbs (no bigger than a computer mouse)  Cut down on sweet beverages. This includes juice, soda, and sweet tea.   Drink at least 1 glass of water with each meal and aim for at least 8 glasses per day  Exercise at least 150 minutes every week.

## 2021-07-21 NOTE — Assessment & Plan Note (Signed)
She is down about 14 pounds since starting Ozempic.  Congratulated patient on weight loss.  She is doing very well.  We discussed increasing dose today.  We will go to 0.5 mg weekly and she will follow-up with me in about 3 months.  Discussed potential side effects.  She is having elevated nausea but this seems to be manageable.

## 2021-07-21 NOTE — Assessment & Plan Note (Signed)
Symptoms are stable.  She has her good days and bad days.  She is currently undergoing ADA process with work.  We are helping her through this process.  We will be drafting a letter supporting her claim.  We have tried several medications in the past without significant improvement.

## 2021-07-21 NOTE — Telephone Encounter (Signed)
Patient had appointment today  Letter done

## 2021-07-21 NOTE — Assessment & Plan Note (Signed)
We can recheck A1c with her next blood draw.

## 2021-07-21 NOTE — Progress Notes (Signed)
   Savannah George is a 40 y.o. female who presents today for an office visit.  Assessment/Plan:  Chronic Problems Addressed Today: Fibromyalgia Symptoms are stable.  She has her good days and bad days.  She is currently undergoing ADA process with work.  We are helping her through this process.  We will be drafting a letter supporting her claim.  We have tried several medications in the past without significant improvement.  Morbid obesity (Savannah George) She is down about 14 pounds since starting Ozempic.  Congratulated patient on weight loss.  She is doing very well.  We discussed increasing dose today.  We will go to 0.5 mg weekly and she will follow-up with me in about 3 months.  Discussed potential side effects.  She is having elevated nausea but this seems to be manageable.  Prediabetes We can recheck A1c with her next blood draw.    Subjective:  HPI:  See A/p.         Objective:  Physical Exam: BP 123/85   Pulse 81   Temp 98.1 F (36.7 C) (Temporal)   Ht '5\' 2"'$  (1.575 m)   Wt 209 lb 12.8 oz (95.2 kg)   SpO2 95%   BMI 38.37 kg/m   Wt Readings from Last 3 Encounters:  07/21/21 209 lb 12.8 oz (95.2 kg)  07/06/21 207 lb (93.9 kg)  04/24/21 223 lb 12.8 oz (101.5 kg)  Gen: No acute distress, resting comfortably CV: Regular rate and rhythm with no murmurs appreciated Pulm: Normal work of breathing, clear to auscultation bilaterally with no crackles, wheezes, or rhonchi Neuro: Grossly normal, moves all extremities Psych: Normal affect and thought content      Savannah George M. Jerline Pain, MD 07/21/2021 9:44 AM

## 2021-07-24 NOTE — Progress Notes (Incomplete)
Office: [phone]  /  Fax: [phone]    Date: August 07, 2021   Appointment Start Time: *** Duration: *** minutes Provider: Glennie Isle, Psy.D. Type of Session: Intake for Individual Therapy  Location of Patient: {gbptloc:23249} Location of Provider: Provider's home (private office) Type of Contact: Telepsychological Visit via MyChart Video Visit  Informed Consent: This provider called Pamala Hurry at 12:02pm as she did not present for today's appointment. A HIPAA compliant voicemail was left requesting a call back. As such, today's appointment was initiated *** minutes late. Prior to proceeding with today's appointment, two pieces of identifying information were obtained. In addition, Shaneika's physical location at the time of this appointment was obtained as well a phone number she could be reached at in the event of technical difficulties. Agape and this provider participated in today's telepsychological service.   The provider's role was explained to [facility]. The provider reviewed and discussed issues of confidentiality, privacy, and limits therein (e.g., reporting obligations). In addition to verbal informed consent, written informed consent for psychological services was obtained prior to the initial appointment. Since the clinic is not a 24/7 crisis center, mental health emergency resources were shared and this  provider explained MyChart, e-mail, voicemail, and/or other messaging systems should be utilized only for non-emergency reasons. This provider also explained that information obtained during appointments will be placed in Jestine's medical record and relevant information will be shared with other providers at [facility] for coordination of care. Nishat agreed information may be shared with other [facility] providers as needed for coordination of care and by signing the service agreement document, she provided written consent for coordination of  care. Prior to initiating telepsychological services, Rainah completed an informed consent document, which included the development of a safety plan (i.e., an emergency contact and emergency resources) in the event of an emergency/crisis. Eartha verbally acknowledged understanding she is ultimately responsible for understanding her insurance benefits for telepsychological and in-person services. This provider also reviewed confidentiality, as it relates to telepsychological services, as well as the rationale for telepsychological services (i.e., to reduce exposure risk to COVID-19). Gabriellah  acknowledged understanding that appointments cannot be recorded without both party consent and she is aware she is responsible for securing confidentiality on her end of the session. Murdis verbally consented to proceed.  Chief Complaint/HPI: Laveyah was referred by Dr. Coralie Common due to  anxiety and depression . Per the note for the initial visit with Dr. Coralie Common on July 06, 2021, "She has done quite a bit of emotional eating and reports she has done this her whole life." The note for the initial appointment with Dr. Coralie Common on July 06, 2021 indicated the following: "her desired weight loss is 42 lbs, she started gaining weight after getting fibromyalgia, her heaviest weight ever was 248 pounds, she has significant food cravings issues, she skips meals frequently, she is frequently drinking liquids with calories, she frequently makes poor food choices, she frequently eats larger portions than normal, and she struggles with emotional eating." Damiyah's Food and Mood (modified PHQ-9) score on July 06, 2021 was 18.  During today's appointment, Lashelle was verbally administered a questionnaire assessing various behaviors related to emotional eating behaviors. Zitlally endorsed the following: {gbmoodandfood:21755}. She shared she craves ***. Leea believes the onset of emotional eating behaviors  was *** and described the current frequency of emotional eating behaviors as ***. In addition, Skyley {gblegal:22371} a history of binge eating behaviors. *** Currently, Jonella indicated ***  triggers emotional eating behaviors, whereas *** makes emotional eating behaviors better. Furthermore, Pamala Hurry {gblegal:22371} other problems of concern. ***   Mental Status Examination:  Appearance: {Appearance:22431} Behavior: {Behavior:22445} Mood: {gbmood:21757} Affect: {Affect:22436} Speech: {Speech:22432} Eye Contact: {Eye Contact:22433} Psychomotor Activity: unable to assess Gait: unable to assess  Thought Process: {thought process:22448}  Thought Content/Perception: {disturbances:22451} Orientation: {Orientation:22437} Memory/Concentration: {gbcognition:22449} Insight/Judgment: {Insight:22446}  Family & Psychosocial History: Angi reported she is *** and ***. She indicated she is currently ***. Additionally, Josselyne shared her highest level of education obtained is ***. Currently, Genesia's social support system consists of her ***. Moreover, Derek stated she resides with her ***.   Medical History:  Past Medical History:  Diagnosis Date   Abnormal Pap smear of cervix    age 39 or 72   Anemia    Anxiety    Back pain    Depression    DVT (deep venous thrombosis) (HCC)    Fibroma    Fibromyalgia 2019   GERD (gastroesophageal reflux disease)    Hyperlipidemia    IBS (irritable bowel syndrome)    Kidney stones    Migraines    Other fatigue    Pneumonia    Poor sleep    Prediabetes    Shortness of breath on exertion    Sleep apnea    BiPAP   Thyroid disease    Urinary incontinence    Vitamin D deficiency    Past Surgical History:  Procedure Laterality Date   CRYOTHERAPY     For abnormal pap smear, age 30 or 31   INTRAUTERINE DEVICE INSERTION     PARAGUARD   Current Outpatient Medications on File Prior to Visit  Medication Sig Dispense Refill   Acetaminophen (PAIN  RELIEF PO) Take 1 tablet by mouth daily.     levonorgestrel (MIRENA) 20 MCG/24HR IUD 1 each by Intrauterine route once.     Multiple Vitamins-Minerals (MULTIVITAMIN PO) Take 1 tablet by mouth daily.      naproxen sodium (ALEVE) 220 MG tablet Take 220 mg by mouth daily as needed.     ondansetron (ZOFRAN) 8 MG tablet Take 1 tablet (8 mg total) by mouth every 8 (eight) hours as needed for nausea or vomiting. 20 tablet 0   OVER THE COUNTER MEDICATION Take 1 capsule by mouth at bedtime. Sleep Tonight Sleep Aide. Contains mix of Ashwagandha, root & leaf extract, L-Thyronine ('250mg'$ );  Cortisol-Reducing blend containing magnolia bark extract ('250mg'$ ); Soy Lecithin ('50mg'$ )     pantoprazole (PROTONIX) 40 MG tablet TAKE 1 TABLET (40 MG TOTAL) BY MOUTH 2 (TWO) TIMES DAILY BEFORE A MEAL. 60 tablet 2   Probiotic Product (PROBIOTIC PEARLS WOMENS PO) Take 1 each by mouth daily.     Semaglutide,0.25 or 0.'5MG'$ /DOS, (OZEMPIC, 0.25 OR 0.5 MG/DOSE,) 2 MG/1.5ML SOPN Inject 0.25 mg into the skin once a week. 1.5 mL 5   tiZANidine (ZANAFLEX) 4 MG tablet TAKE 1 TABLET BY MOUTH EVERY 6 HOURS AS NEEDED FOR MUSCLE SPASMS. 90 tablet 1   No current facility-administered medications on file prior to visit.    Mental Health History: Cynde reported ***. She {gblegal:22371} a history of psychotropic medications. Eliannah {Endorse or deny of item:23407} hospitalizations for psychiatric concerns. Kambell {gblegal:22371} a family history of mental health related concerns. *** Athelene {Endorse or deny of item:23407} trauma including {gbtrauma:22071} abuse, as well as neglect. Pamala Hurry described her typical mood lately as ***. Aside from concerns noted above and endorsed on the PHQ-9 and GAD-7, Taylor reported ***.  Abigayl {gblegal:22371} current alcohol use. *** She {gblegal:22371} tobacco use. *** She [id number] illicit/recreational substance use. Regarding caffeine intake, Shizuko reported ***. Furthermore, Caton indicated  she is not experiencing the following: {gbsxs:21965}. She also denied history of and current suicidal ideation, plan, and intent; history of and current homicidal ideation, plan, and intent; and history of and current engagement in self-harm.  The following strengths were reported by Pamala Hurry: ***. The following strengths were observed by this provider: ability to express thoughts and feelings during the therapeutic session, ability to establish and benefit from a therapeutic relationship, willingness to work toward established goal(s) with the clinic and ability to engage in reciprocal conversation. ***  Legal History: Jillien {Endorse or deny of item:23407} legal involvement.   Structured Assessments Results: The Patient Health Questionnaire-9 (PHQ-9) is a self-report measure that assesses symptoms and severity of depression over the course of the last two weeks. Tashari obtained a score of *** suggesting {GBPHQ9SEVERITY:21752}. Tavonna finds the endorsed symptoms to be {gbphq9difficulty:21754}. [0= Not at all; 1= Several days; 2= More than half the days; 3= Nearly every day] Little interest or pleasure in doing things ***  Feeling down, depressed, or hopeless ***  Trouble falling or staying asleep, or sleeping too much ***  Feeling tired or having little energy ***  Poor appetite or overeating ***  Feeling bad about yourself --- or that you are a failure or have let yourself or your family down ***  Trouble concentrating on things, such as reading the newspaper or watching television ***  Moving or speaking so slowly that other people could have noticed? Or the opposite --- being so fidgety or restless that you have been moving around a lot more than usual ***  Thoughts that you would be better off dead or hurting yourself in some way ***  PHQ-9 Score ***    The Generalized Anxiety Disorder-7 (GAD-7) is a brief self-report measure that assesses symptoms of anxiety over the course of the last two  weeks. Tanise obtained a score of *** suggesting {gbgad7severity:21753}. Pammie finds the endorsed symptoms to be {gbphq9difficulty:21754}. [0= Not at all; 1= Several days; 2= Over half the days; 3= Nearly every day] Feeling nervous, anxious, on edge ***  Not being able to stop or control worrying ***  Worrying too much about different things ***  Trouble relaxing ***  Being so restless that it's hard to sit still ***  Becoming easily annoyed or irritable ***  Feeling afraid as if something awful might happen ***  GAD-7 Score ***   Interventions:  {Interventions List for Intake:23406}  Provisional DSM-5 Diagnosis(es): {Diagnoses:22752}  Plan: Cece appears able and willing to participate as evidenced by collaboration on a treatment goal, engagement in reciprocal conversation, and asking questions as needed for clarification. The next appointment will be scheduled in {gbweeks:21758}, which will be {gbtxmodality:23402}. The following treatment goal was established: {gbtxgoals:21759}. This provider will regularly review the treatment plan and medical chart to keep informed of status changes. Enma expressed understanding and agreement with the initial treatment plan of care. *** Lucian will be sent a handout via e-mail to utilize between now and the next appointment to increase awareness of hunger patterns and subsequent eating. Crystalle provided verbal consent during today's appointment for this provider to send the handout via e-mail. ***

## 2021-07-25 ENCOUNTER — Telehealth (INDEPENDENT_AMBULATORY_CARE_PROVIDER_SITE_OTHER): Payer: 59 | Admitting: Psychology

## 2021-08-02 ENCOUNTER — Ambulatory Visit (INDEPENDENT_AMBULATORY_CARE_PROVIDER_SITE_OTHER): Payer: 59 | Admitting: Bariatrics

## 2021-08-07 ENCOUNTER — Encounter (INDEPENDENT_AMBULATORY_CARE_PROVIDER_SITE_OTHER): Payer: Self-pay

## 2021-08-07 ENCOUNTER — Telehealth (INDEPENDENT_AMBULATORY_CARE_PROVIDER_SITE_OTHER): Payer: Self-pay | Admitting: Psychology

## 2021-08-07 ENCOUNTER — Telehealth (INDEPENDENT_AMBULATORY_CARE_PROVIDER_SITE_OTHER): Payer: 59 | Admitting: Psychology

## 2021-08-07 NOTE — Telephone Encounter (Signed)
  Office: [phone]  /  Fax: [phone]  Date of Call: August 07, 2021  Time of Call: 12:02pm Provider: Glennie Isle, PsyD  CONTENT: This provider called Pamala Hurry to check-in as she did not present for today's MyChart Video Visit appointment at 12:00pm. A HIPAA compliant voicemail was left requesting a call back. Of note, this provider stayed on the MyChart Video Visit appointment for 5 minutes prior to signing off per the clinic's grace period policy.    PLAN: This provider will wait for Elvie to call back. No further follow-up planned by this provider.

## 2021-10-24 ENCOUNTER — Ambulatory Visit: Payer: 59 | Admitting: Family Medicine

## 2021-10-24 ENCOUNTER — Encounter: Payer: Self-pay | Admitting: Family Medicine

## 2021-10-24 ENCOUNTER — Other Ambulatory Visit: Payer: Self-pay

## 2021-10-24 VITALS — BP 105/73 | HR 99 | Temp 98.5°F | Ht 62.0 in | Wt 197.0 lb

## 2021-10-24 DIAGNOSIS — F321 Major depressive disorder, single episode, moderate: Secondary | ICD-10-CM | POA: Diagnosis not present

## 2021-10-24 DIAGNOSIS — M797 Fibromyalgia: Secondary | ICD-10-CM | POA: Diagnosis not present

## 2021-10-24 DIAGNOSIS — R7303 Prediabetes: Secondary | ICD-10-CM | POA: Diagnosis not present

## 2021-10-24 MED ORDER — OZEMPIC (0.25 OR 0.5 MG/DOSE) 2 MG/1.5ML ~~LOC~~ SOPN: 0.5000 mg | PEN_INJECTOR | SUBCUTANEOUS | 5 refills | Status: DC

## 2021-10-24 NOTE — Assessment & Plan Note (Signed)
Doing well with Ozempic 0.5 mg weekly.  We discussed increasing dose however given her significant improvement at this dose we will continue for now.  She is down about 12 pounds over the last 3 months.  We will continue current dose.  Follow-up in 3 to 6 months.

## 2021-10-24 NOTE — Assessment & Plan Note (Signed)
Symptoms about the same since her last visit.  We have tried several medications without improvement.  She is currently on a wait list to see therapist at [facility].

## 2021-10-24 NOTE — Assessment & Plan Note (Signed)
We can recheck A1c next blood draw.

## 2021-10-24 NOTE — Progress Notes (Signed)
   Savannah George is a 40 y.o. female who presents today for an office visit.  Assessment/Plan:  Chronic Problems Addressed Today: Fibromyalgia Overall stable since our last visit.  She needs updated FMLA.  She is working through water therapy which seems to be helping.  We have tried several medications in the past without improvement.  Follow-up in 3 to 6 months.  Hopefully she will be able to see a therapist at [facility] by then.  Depression, major, single episode, moderate ([place]) Symptoms about the same since her last visit.  We have tried several medications without improvement.  She is currently on a wait list to see therapist at [facility].  Morbid obesity ([place]) Doing well with Ozempic 0.5 mg weekly.  We discussed increasing dose however given her significant improvement at this dose we will continue for now.  She is down about 12 pounds over the last 3 months.  We will continue current dose.  Follow-up in 3 to 6 months.  Prediabetes We can recheck A1c next blood draw.    Subjective:  HPI:  She is here to follow up on fibromyalgia. She still have issue with fibromyalgia. She have some associated nausea and fatigue. She lost about 12 pounds since last visit. She was started on Ozempic which helped with her weight. She is currently going through ADA process at work. She would like [place] fill out the letter at the office today.  See A/p for status of chronic conditions.        Objective:  Physical Exam: BP 105/73   Pulse 99   Temp 98.5 F (36.9 C) (Temporal)   Ht 5\' 2"  (1.575 m)   Wt 197 lb (89.4 kg)   SpO2 96%   BMI 36.03 kg/m   Gen: No acute distress, resting comfortably CV: Regular rate and rhythm with no murmurs appreciated Pulm: Normal work of breathing, clear to auscultation bilaterally with no crackles, wheezes, or rhonchi Neuro: Grossly normal, moves all extremities Psych: Normal affect and thought content       I,Savera Zaman,acting as a scribe for Dimas Chyle, MD.,have  documented all relevant documentation on the behalf of Dimas Chyle, MD,as directed by  Dimas Chyle, MD while in the presence of Dimas Chyle, MD.   I, Dimas Chyle, MD, have reviewed all documentation for this visit. The documentation on 10/24/21 for the exam, diagnosis, procedures, and orders are all accurate and complete.  Algis Greenhouse. Jerline Pain, MD 10/24/2021 12:21 PM

## 2021-10-24 NOTE — Patient Instructions (Signed)
It was very nice to see you today!  No medication changes today.  Please send [place] your FMLA form when you can.  We will see you back in 3 to 6 months.  Please come back to see [place] sooner if needed.  Take care, Dr Jerline Pain  PLEASE NOTE:  If you had any lab tests please let us know if you have not heard back within a few days. You may see your results on mychart before we have a chance to review them but we will give you a call once they are reviewed by [place]. If we ordered any referrals today, please let us know if you have not heard from their office within the next week.   Please try these tips to maintain a healthy lifestyle:  Eat at least 3 REAL meals and 1-2 snacks per day.  Aim for no more than 5 hours between eating.  If you eat breakfast, please do so within one hour of getting up.   Each meal should contain half fruits/vegetables, one quarter protein, and one quarter carbs (no bigger than a computer mouse)  Cut down on sweet beverages. This includes juice, soda, and sweet tea.   Drink at least 1 glass of water with each meal and aim for at least 8 glasses per day  Exercise at least 150 minutes every week.

## 2021-10-24 NOTE — Assessment & Plan Note (Signed)
Overall stable since our last visit.  She needs updated FMLA.  She is working through water therapy which seems to be helping.  We have tried several medications in the past without improvement.  Follow-up in 3 to 6 months.  Hopefully she will be able to see a therapist at [facility] by then.

## 2021-10-31 ENCOUNTER — Telehealth: Payer: Self-pay | Admitting: *Deleted

## 2021-10-31 NOTE — Telephone Encounter (Signed)
FMLA ready to be pick up, placed at front office  Copy done  LVM to patient

## 2021-11-27 ENCOUNTER — Encounter: Payer: Self-pay | Admitting: Family Medicine

## 2021-11-28 NOTE — Telephone Encounter (Signed)
See note

## 2021-11-29 ENCOUNTER — Encounter: Payer: Self-pay | Admitting: Family Medicine

## 2021-11-29 NOTE — Telephone Encounter (Signed)
Called pt and LVM to advise letter is printed and available for her in the front office.

## 2022-02-09 ENCOUNTER — Other Ambulatory Visit: Payer: Self-pay | Admitting: Family Medicine

## 2022-02-11 ENCOUNTER — Other Ambulatory Visit: Payer: Self-pay | Admitting: Internal Medicine

## 2022-03-18 ENCOUNTER — Other Ambulatory Visit: Payer: Self-pay | Admitting: Internal Medicine

## 2022-03-27 ENCOUNTER — Telehealth: Payer: Self-pay | Admitting: *Deleted

## 2022-03-27 NOTE — Telephone Encounter (Signed)
(  Key: [id number]) ?Ozempic (0.25 or 0.5 MG/DOSE) '2MG'$ /1.5ML pen-injectors ?Waiting for determination  ?

## 2022-03-28 NOTE — Telephone Encounter (Signed)
This request was denied because you did not meet the following clinical requirements ?Left message to return call to our office at their convenience.  ? ?Notice of denial printed and placed to be scan on patient chart  ?

## 2022-03-28 NOTE — Telephone Encounter (Signed)
Patient return call advise to call insurance for alternative coverage  ? ?

## 2022-04-12 ENCOUNTER — Telehealth: Payer: Self-pay | Admitting: *Deleted

## 2022-04-12 ENCOUNTER — Ambulatory Visit (INDEPENDENT_AMBULATORY_CARE_PROVIDER_SITE_OTHER): Payer: 59 | Admitting: Family Medicine

## 2022-04-12 ENCOUNTER — Encounter: Payer: Self-pay | Admitting: Family Medicine

## 2022-04-12 DIAGNOSIS — R7303 Prediabetes: Secondary | ICD-10-CM

## 2022-04-12 DIAGNOSIS — M797 Fibromyalgia: Secondary | ICD-10-CM | POA: Diagnosis not present

## 2022-04-12 DIAGNOSIS — M25562 Pain in left knee: Secondary | ICD-10-CM

## 2022-04-12 DIAGNOSIS — K219 Gastro-esophageal reflux disease without esophagitis: Secondary | ICD-10-CM

## 2022-04-12 DIAGNOSIS — F321 Major depressive disorder, single episode, moderate: Secondary | ICD-10-CM | POA: Diagnosis not present

## 2022-04-12 MED ORDER — ESCITALOPRAM OXALATE 5 MG PO TABS: 5.0000 mg | ORAL_TABLET | Freq: Every day | ORAL | 5 refills | Status: DC

## 2022-04-12 MED ORDER — PANTOPRAZOLE SODIUM 40 MG PO TBEC: 40.0000 mg | DELAYED_RELEASE_TABLET | Freq: Two times a day (BID) | ORAL | 5 refills | Status: DC

## 2022-04-12 NOTE — Assessment & Plan Note (Signed)
Stable.  Refilled Protonix. ?

## 2022-04-12 NOTE — Patient Instructions (Addendum)
It was very nice to see you today! ? ?Please start the lexapro. We will start at low dose.  See me message in 1 to a few weeks how this is working. I will refer you to see a therapist also.  ? ?I think you may have some arthritis in your knee.  We will have you see a physical therapist.  Let me know if not improving.  We will complete your FMLA paperwork today. ? ?I will refill your other medications. ? ?Take care, ?Dr Jerline Pain ? ?PLEASE NOTE: ? ?If you had any lab tests please let us know if you have not heard back within a few days. You may see your results on mychart before we have a chance to review them but we will give you a call once they are reviewed by [place]. If we ordered any referrals today, please let us know if you have not heard from their office within the next week.  ? ?Please try these tips to maintain a healthy lifestyle: ? ?Eat at least 3 REAL meals and 1-2 snacks per day.  Aim for no more than 5 hours between eating.  If you eat breakfast, please do so within one hour of getting up.  ? ?Each meal should contain half fruits/vegetables, one quarter protein, and one quarter carbs (no bigger than a computer mouse) ? ?Cut down on sweet beverages. This includes juice, soda, and sweet tea.  ? ?Drink at least 1 glass of water with each meal and aim for at least 8 glasses per day ? ?Exercise at least 150 minutes every week.   ?

## 2022-04-12 NOTE — Telephone Encounter (Signed)
PA (Key: [id number]) ?Ozempic (0.25 or 0.5 MG/DOSE) '2MG'$ /1.5ML pen-injectors ?

## 2022-04-12 NOTE — Progress Notes (Signed)
? ?Savannah George is a 41 y.o. female who presents today for an office visit. ? ?Assessment/Plan:  ?New/Acute Problems: ?Left Knee Pain ?No red flags. Exam concerning for osteoarthritis though she may have some component of patellofemoral syndrome as well.  We will try conservative management with physical therapy for a couple of weeks.  If still not improving would consider imaging/or referral to sports medicine. ? ?Chronic Problems Addressed Today: ` ?Prediabetes ?Recheck A1c next blood draw.  She has been doing well with Ozempic. ? ?Fibromyalgia ?Symptoms are overall stable since her last visit.  We will update her FMLA paperwork today.  She has not had well with Lyrica or Cymbalta in the past though we will be starting Lexapro as below for depression.  Hopefully this will help some with her fibromyalgia symptoms as well.  She will follow-up in a few weeks via MyChart.  We can see her back for an in person visit in 3 to 6 months. ? ?Depression, major, single episode, moderate ([place]) ?Not controlled.  No reported SI or HI.  We tried an SNRI last year to help with fibromyalgia which she did not tolerate due to side effects.  She had been seeing a therapist but had to stop due to financial reasons.  We discussed treatment options.  We will be starting low-dose Lexapro 5 mg daily and refer her to see a therapist.  Discussed possible side effects she will check in with me in a couple weeks via MyChart and we can titrate the dose of Lexapro as needed. ? ?Morbid obesity ([place]) ?She is down about 30 pounds over the last year.  She is doing well on Ozempic 0.5 mg weekly.  Her insurance may no longer be covering this after next month.  Advised her to check with them to see if there are any recommended alternatives if this is the case. ? ?GERD (gastroesophageal reflux disease) ?Stable.  Refilled Protonix. ? ? ?  ?Subjective:  ?HPI: ? ?See A/p for status of chronic conditions. She needs her FMLA paperwork completed today. She  is requesting accomodation for her fibromylagia symptoms and depression.  ? ?She has also been having more pain in her left knee. She has had worsening pain with certain motions like going from sitting to standing or walking. Started about 2 weeks ago. No obvious injuries or precipitating events.  No treatments tried.  ? ?   ?  ?Objective:  ?Physical Exam: ?BP (!) 119/91   Pulse 64   Temp 98.8 ?F (37.1 ?C) (Temporal)   Ht '5\' 2"'$  (1.575 m)   Wt 196 lb 9.6 oz (89.2 kg)   SpO2 98%   BMI 35.96 kg/m?   ?Wt Readings from Last 3 Encounters:  ?04/12/22 196 lb 9.6 oz (89.2 kg)  ?10/24/21 197 lb (89.4 kg)  ?07/21/21 209 lb 12.8 oz (95.2 kg)  ?Gen: No acute distress, resting comfortably ?CV: Regular rate and rhythm with no murmurs appreciated ?Pulm: Normal work of breathing, clear to auscultation bilaterally with no crackles, wheezes, or rhonchi ?MSK: ?- Left Knee: No deformities.  Slight tenderness palpation along bilateral joint line.  Crepitus noted with passive and active range of motion.  Neurovascular intact distally. ?Neuro: Grossly normal, moves all extremities ?Psych: Normal affect and thought content ? ?Time Spent: ?45 minutes of total time was spent on the date of the encounter performing the following actions: chart review prior to seeing the patient, obtaining history, performing a medically necessary exam, counseling on the treatment plan, placing orders,  completing her FMLA paperwork, and documenting in our EHR.  ? ? ?   ? ?Algis Greenhouse. Jerline Pain, MD ?04/12/2022 3:38 PM  ?

## 2022-04-12 NOTE — Assessment & Plan Note (Signed)
Symptoms are overall stable since her last visit.  We will update her FMLA paperwork today.  She has not had well with Lyrica or Cymbalta in the past though we will be starting Lexapro as below for depression.  Hopefully this will help some with her fibromyalgia symptoms as well.  She will follow-up in a few weeks via MyChart.  We can see her back for an in person visit in 3 to 6 months. ?

## 2022-04-12 NOTE — Progress Notes (Deleted)
41 y.o. G0P0000 Divorced White or Caucasian Not Hispanic or Latino female here for annual exam.      No LMP recorded. (Menstrual status: IUD).          Sexually active: {yes no:314532}  The current method of family planning is {contraception:315051}.    Exercising: {yes no:314532}  {types:19826} Smoker:  {YES NO:22349}  Health Maintenance: Pap:  04/17/21 WNL HR HPV Neg, 10-10-18 neg HPV HR neg  History of abnormal Pap:  yes cryo surgery at age 72  MMG:  02/26/20 Bi-rads 1 neg BMD:   n/a Colonoscopy: *** TDaP:  03/03/19  Gardasil: x1   reports that she has never smoked. She has never used smokeless tobacco. She reports current alcohol use. She reports that she does not use drugs.  Past Medical History:  Diagnosis Date   Abnormal Pap smear of cervix    age 24 or 67   Anemia    Anxiety    Back pain    Depression    DVT (deep venous thrombosis) (HCC)    Fibroma    Fibromyalgia 2019   GERD (gastroesophageal reflux disease)    Hyperlipidemia    IBS (irritable bowel syndrome)    Kidney stones    Migraines    Other fatigue    Pneumonia    Poor sleep    Prediabetes    Shortness of breath on exertion    Sleep apnea    BiPAP   Thyroid disease    Urinary incontinence    Vitamin D deficiency     Past Surgical History:  Procedure Laterality Date   CRYOTHERAPY     For abnormal pap smear, age 20 or 12   INTRAUTERINE DEVICE INSERTION     PARAGUARD    Current Outpatient Medications  Medication Sig Dispense Refill   Acetaminophen (PAIN RELIEF PO) Take 1 tablet by mouth daily.     levonorgestrel (MIRENA) 20 MCG/24HR IUD 1 each by Intrauterine route once.     Multiple Vitamins-Minerals (MULTIVITAMIN PO) Take 1 tablet by mouth daily.      naproxen sodium (ALEVE) 220 MG tablet Take 220 mg by mouth daily as needed.     ondansetron (ZOFRAN) 8 MG tablet Take 1 tablet (8 mg total) by mouth every 8 (eight) hours as needed for nausea or vomiting. 20 tablet 0   OVER THE COUNTER  MEDICATION Take 1 capsule by mouth at bedtime. Sleep Tonight Sleep Aide. Contains mix of Ashwagandha, root & leaf extract, L-Thyronine ('250mg'$ );  Cortisol-Reducing blend containing magnolia bark extract ('250mg'$ ); Soy Lecithin ('50mg'$ )     OZEMPIC, 0.25 OR 0.5 MG/DOSE, 2 MG/1.5ML SOPN INJECT 0.'25MG'$  INTO THE SKIN ONE TIME PER WEEK (MAX 30 DAY SUPPLY, ACTUALLY 56 DAY SUPPLY) 1.5 mL 5   pantoprazole (PROTONIX) 40 MG tablet Take 1 tablet (40 mg total) by mouth 2 (two) times daily before a meal. **PLEASE CONTACT OFFICE TO SCHEDULE FOLLOW UP 60 tablet 0   Probiotic Product (PROBIOTIC PEARLS WOMENS PO) Take 1 each by mouth daily.     tiZANidine (ZANAFLEX) 4 MG tablet TAKE 1 TABLET BY MOUTH EVERY 6 HOURS AS NEEDED FOR MUSCLE SPASMS. 90 tablet 1   No current facility-administered medications for this visit.    Family History  Problem Relation Age of Onset   Anxiety disorder Mother    Depression Mother    Kidney disease Mother    Hypertension Mother    Diabetes Mother    Kidney cancer Mother    Renal cancer  Mother    Obesity Father    Sleep apnea Father    Anxiety disorder Father    Depression Father    Hyperlipidemia Father    Diabetes Father    Heart disease Father    Stroke Father    Colon polyps Father    Hypertension Father    Uterine cancer Sister    Colon polyps Sister    Endometriosis Sister    Hypertension Sister    Other Sister        Lung disease   Ovarian cancer Sister        hysterectomy   Other Brother        Lung disease   Diabetes Paternal Grandmother    Heart disease Paternal Grandmother        CHF   Diabetes Paternal Grandfather    Stroke Paternal Grandfather    Heart attack Paternal Grandfather    Kidney disease Maternal Uncle    Diabetes Paternal Aunt    Heart disease Other    Depression Other    Anxiety disorder Other    Sleep apnea Other    Obesity Other     Review of Systems  Exam:   There were no vitals taken for this visit.  Weight change:  '@WEIGHTCHANGE'$ @ Height:      Ht Readings from Last 3 Encounters:  10/24/21 '5\' 2"'$  (1.575 m)  07/21/21 '5\' 2"'$  (1.575 m)  07/06/21 '5\' 2"'$  (1.575 m)    General appearance: alert, cooperative and appears stated age Head: Normocephalic, without obvious abnormality, atraumatic Neck: no adenopathy, supple, symmetrical, trachea midline and thyroid {CHL AMB PHY EX THYROID NORM [phone]::"normal to inspection and palpation"} Lungs: clear to auscultation bilaterally Cardiovascular: regular rate and rhythm Breasts: {Exam; breast:13139::"normal appearance, no masses or tenderness"} Abdomen: soft, non-tender; non distended,  no masses,  no organomegaly Extremities: extremities normal, atraumatic, no cyanosis or edema Skin: Skin color, texture, turgor normal. No rashes or lesions Lymph nodes: Cervical, supraclavicular, and axillary nodes normal. No abnormal inguinal nodes palpated Neurologic: Grossly normal   Pelvic: External genitalia:  no lesions              Urethra:  normal appearing urethra with no masses, tenderness or lesions              Bartholins and Skenes: normal                 Vagina: normal appearing vagina with normal color and discharge, no lesions              Cervix: {CHL AMB PHY EX CERVIX NORM DEFAULT:[phone]::"no lesions"}               Bimanual Exam:  Uterus:  {CHL AMB PHY EX UTERUS NORM DEFAULT:[phone]::"normal size, contour, position, consistency, mobility, non-tender"}              Adnexa: {CHL AMB PHY EX ADNEXA NO MASS DEFAULT:[phone]::"no mass, fullness, tenderness"}               Rectovaginal: Confirms               Anus:  normal sphincter tone, no lesions  *** chaperoned for the exam.  A:  Well Woman with normal exam  P:

## 2022-04-12 NOTE — Assessment & Plan Note (Signed)
Not controlled.  No reported SI or HI.  We tried an SNRI last year to help with fibromyalgia which she did not tolerate due to side effects.  She had been seeing a therapist but had to stop due to financial reasons.  We discussed treatment options.  We will be starting low-dose Lexapro 5 mg daily and refer her to see a therapist.  Discussed possible side effects she will check in with me in a couple weeks via MyChart and we can titrate the dose of Lexapro as needed. ?

## 2022-04-12 NOTE — Assessment & Plan Note (Signed)
Recheck A1c next blood draw.  She has been doing well with Ozempic. ?

## 2022-04-12 NOTE — Assessment & Plan Note (Signed)
She is down about 30 pounds over the last year.  She is doing well on Ozempic 0.5 mg weekly.  Her insurance may no longer be covering this after next month.  Advised her to check with them to see if there are any recommended alternatives if this is the case. ?

## 2022-04-16 ENCOUNTER — Telehealth: Payer: Self-pay

## 2022-04-16 NOTE — Telephone Encounter (Signed)
Insurance company has informed patient that the only other med on file is Metformin.  Patient states she was on this for months and it did not help her.  However, they also suggested our office try to send script in for wegovy first.

## 2022-04-16 NOTE — Telephone Encounter (Addendum)
Patient states she has started taking lexapro.  States she is having a few side effects that is effecting her work.   States she is experiencing vertigo, dizziness, nausea, dry mouth, fatigue trouble sleeping,  disassociation ( can not tell where she is in space), random crying, brain fog, trouble speaking, emotional control and suicidal ideation (not having at moment).  States employer is requesting letter.  I have also given patient info for [place] at [place].  Patient was just seen 4/20.    Please advise on appt?

## 2022-04-16 NOTE — Telephone Encounter (Signed)
See note

## 2022-04-16 NOTE — Telephone Encounter (Signed)
your request for prior authorization for OZEMPIC INJ 2/1.5ML, for the above member; however, [employer] has a denied request on file for OZEMPIC INJ 2/1.5ML for this member  ?Patient notified, advise to call insurance for medication coverage  ? ?

## 2022-04-17 ENCOUNTER — Telehealth: Payer: Self-pay | Admitting: *Deleted

## 2022-04-17 ENCOUNTER — Other Ambulatory Visit: Payer: Self-pay | Admitting: Family Medicine

## 2022-04-17 ENCOUNTER — Other Ambulatory Visit: Payer: Self-pay | Admitting: *Deleted

## 2022-04-17 MED ORDER — WEGOVY 0.25 MG/0.5ML ~~LOC~~ SOAJ
0.2500 mg | SUBCUTANEOUS | 0 refills | Status: AC
Start: 2022-04-17 — End: ?

## 2022-04-17 NOTE — Telephone Encounter (Signed)
Ok to send in rx for wegovy 0.'25mg'$  weekly for 4 weeks. ? ?Algis Greenhouse. Jerline Pain, MD ?04/17/2022 12:19 PM  ? ?

## 2022-04-17 NOTE — Telephone Encounter (Signed)
Left message to return call to our office at their convenience.  

## 2022-04-17 NOTE — Telephone Encounter (Signed)
Patient notified FMLA form done, faxed to [phone] ?Copy placed in front office for patient to pick up  ?Copy placed to be scan in patient chart  ?

## 2022-04-17 NOTE — Telephone Encounter (Signed)
Spoke with patient, patient aware to stop medication Rx Lexapro , if any thought of suicidal go to ED  ?Follow up soon with Dr Jerline Pain. Patient stated took Rx this morning felt confused, doing better now  ?Will schedule appointment soon  ?

## 2022-04-17 NOTE — Telephone Encounter (Signed)
Rx send to Pharmacy  ?

## 2022-04-17 NOTE — Telephone Encounter (Signed)
Recommend she stop the medical ASAP. She should let us know if she is still having persisting side effects, especially any thoughts of self harm. Please have her schedule a follow up appointment soon but if she is having suicidal ideation she needs to go to the emergency room or come here ASAP.  ? ?Algis Greenhouse. Jerline Pain, MD ?04/17/2022 8:16 AM  ? ?

## 2022-04-17 NOTE — Telephone Encounter (Signed)
See note

## 2022-04-19 ENCOUNTER — Ambulatory Visit: Payer: 59 | Admitting: Obstetrics and Gynecology

## 2022-04-20 ENCOUNTER — Encounter: Payer: Self-pay | Admitting: Family Medicine

## 2022-04-20 NOTE — Telephone Encounter (Signed)
See note

## 2022-04-23 NOTE — Telephone Encounter (Signed)
Can we clarify on the FMLA? The paperwork we completed was a bit different that the one we did previously but we did add in more about the mental health stuff. ? ?I am ok with referring her to see a psychiatrist.  ?

## 2022-04-27 NOTE — Telephone Encounter (Signed)
Called pt to get a better description as to what she needed. Pt explained that her employer advised her the paperwork was different this time than in the past. Will discuss referral at that time as well.  ?

## 2022-04-30 ENCOUNTER — Encounter: Payer: Self-pay | Admitting: Family Medicine

## 2022-04-30 NOTE — Telephone Encounter (Signed)
Patient has called in to make sure you have seen attached documents for FMLA.

## 2022-04-30 NOTE — Telephone Encounter (Signed)
Advised pt I have her new FMLA paperwork and will have completed for her by the end of the week, will also contact pt when completed in case she would like to pick it up in the office.  ?

## 2022-05-02 NOTE — Telephone Encounter (Signed)
Pt has stated to fax it to the number on the cover sheet and then she would like to pick up the original.  ?

## 2022-05-02 NOTE — Telephone Encounter (Signed)
HR Dept from [place] called in regards to [facility] paperwork received today on pt and wanting to confirm the changes from April paperwork to what was sent in today. Advised the first set sent in was not as specific for the patient and needed to amended, hence new paperwork sent in today. Understanding was verbalized.  ?

## 2022-05-02 NOTE — Telephone Encounter (Signed)
Called pt to advise FMLA paperwork was amended and ready for pick up or fax. LVM to advise to return my call or send MyChart msg letting me know if she would like to pick up or fax it into employer.  ?

## 2022-05-02 NOTE — Telephone Encounter (Signed)
FMLA paperwork faxed

## 2022-05-04 ENCOUNTER — Ambulatory Visit: Payer: 59 | Admitting: Physical Therapy

## 2022-05-07 ENCOUNTER — Telehealth: Payer: Self-pay | Admitting: Family Medicine

## 2022-05-07 NOTE — Telephone Encounter (Signed)
LVM for pt to return my call at her convenience to get more information since all of that information is on her FMLA paperwork.  ?

## 2022-05-07 NOTE — Telephone Encounter (Signed)
Patient stated she received her FMLA from employer however a return to work letter is needed stating DX and accommodations that are being requested.-  Letter should also include return to work date. Letter also needs to state what kind of accommodations she needs and what for: ADA/ what kind of behavior.   ? ?Patient was advised MA might call her should they have any questions. Patient agreed.  ?

## 2022-05-08 NOTE — Telephone Encounter (Signed)
Pt called back in and is asking for a return call.  ?

## 2022-05-09 NOTE — Telephone Encounter (Signed)
Spoke to patient and coming in for physical on 05/10/22 and to discuss ADA letter for employer.  ?

## 2022-05-10 ENCOUNTER — Ambulatory Visit (INDEPENDENT_AMBULATORY_CARE_PROVIDER_SITE_OTHER): Payer: 59 | Admitting: Family Medicine

## 2022-05-10 ENCOUNTER — Encounter: Payer: Self-pay | Admitting: Family Medicine

## 2022-05-10 VITALS — BP 130/86 | HR 62 | Temp 98.2°F | Ht 62.0 in | Wt 199.4 lb

## 2022-05-10 DIAGNOSIS — M797 Fibromyalgia: Secondary | ICD-10-CM | POA: Diagnosis not present

## 2022-05-10 DIAGNOSIS — Z0001 Encounter for general adult medical examination with abnormal findings: Secondary | ICD-10-CM | POA: Diagnosis not present

## 2022-05-10 DIAGNOSIS — R7303 Prediabetes: Secondary | ICD-10-CM

## 2022-05-10 DIAGNOSIS — F321 Major depressive disorder, single episode, moderate: Secondary | ICD-10-CM

## 2022-05-10 DIAGNOSIS — E7800 Pure hypercholesterolemia, unspecified: Secondary | ICD-10-CM

## 2022-05-10 LAB — COMPREHENSIVE METABOLIC PANEL
ALT: 42 U/L — ABNORMAL HIGH (ref 0–35)
AST: 24 U/L (ref 0–37)
Albumin: 4.6 g/dL (ref 3.5–5.2)
Alkaline Phosphatase: 94 U/L (ref 39–117)
BUN: 15 mg/dL (ref 6–23)
CO2: 29 mEq/L (ref 19–32)
Calcium: 9.7 mg/dL (ref 8.4–10.5)
Chloride: 102 mEq/L (ref 96–112)
Creatinine, Ser: 0.78 mg/dL (ref 0.40–1.20)
GFR: 93.75 mL/min (ref >60.00)
Glucose, Bld: 104 mg/dL — ABNORMAL HIGH (ref 70–99)
Potassium: 3.9 mEq/L (ref 3.5–5.1)
Sodium: 139 mEq/L (ref 135–145)
Total Bilirubin: 0.5 mg/dL (ref 0.2–1.2)
Total Protein: 7.6 g/dL (ref 6.0–8.3)

## 2022-05-10 LAB — CBC
HCT: 37.4 % (ref 36.0–46.0)
Hemoglobin: 12.9 g/dL (ref 12.0–15.0)
MCHC: 34.4 g/dL (ref 30.0–36.0)
MCV: 86.2 fl (ref 78.0–100.0)
Platelets: 369 10*3/uL (ref 150.0–400.0)
RBC: 4.34 Mil/uL (ref 3.87–5.11)
RDW: 13.8 % (ref 11.5–15.5)
WBC: 7.1 10*3/uL (ref 4.0–10.5)

## 2022-05-10 LAB — TSH: TSH: 2.26 u[IU]/mL (ref 0.35–5.50)

## 2022-05-10 LAB — LIPID PANEL
Cholesterol: 243 mg/dL — ABNORMAL HIGH (ref 0–200)
HDL: 60.2 mg/dL (ref >39.00)
LDL Cholesterol: 161 mg/dL — ABNORMAL HIGH (ref 0–99)
NonHDL: 183.23
Total CHOL/HDL Ratio: 4
Triglycerides: 113 mg/dL (ref 0.0–149.0)
VLDL: 22.6 mg/dL (ref 0.0–40.0)

## 2022-05-10 LAB — HEMOGLOBIN A1C: Hgb A1c MFr Bld: 5.8 % (ref 4.6–6.5)

## 2022-05-10 NOTE — Assessment & Plan Note (Signed)
She has been doing well on Ozempic however is been off of it for the last few weeks due to insurance along her pain 4.  We are trying to get her on [facility].  She will continue to work on diet and exercise as much she is able to.

## 2022-05-10 NOTE — Assessment & Plan Note (Signed)
Symptoms are still not controlled. No reported SI or HI. We tried cymbalta last year which she did not tolerated and we tried lexapro a few weeks ago which she also did not tolerate. She is not currently on any medications. We referred her to see a therapist at our last visit but she has not yet seen them due to limited availability but will be seeing them in a few weeks. She is still interested in trying a medication to help manage her symptoms and she is willing to see a psychiatrist. We will place referral today.

## 2022-05-10 NOTE — Assessment & Plan Note (Addendum)
Symptoms are overall stable though still significantly debilitating and severely impacting her quality of life.  We spent a significant mount of time today discussing her symptoms and treatment plan.  She has talked with her HR department and they have recommended that she obtain an ADA letter stating specific diagnoses and specific tasks and specific accommodations.  We discussed this during her office visit today including the need for her to have more time to complete task or have extra help on hand with completing tasks such as moving furniture or setting up for programs at work.  She will send me a letter via [place] in the next few days detailing all the test that she thinks she needs help with at work.  Her work also recommended she follow-up with a fibromyalgia specialist.  We will place referral to rheumatology today.

## 2022-05-10 NOTE — Assessment & Plan Note (Signed)
Recheck A1c. She had been doing well on ozempic but insurance stopped paying for this.

## 2022-05-10 NOTE — Patient Instructions (Addendum)
It was very nice to see you today!  We will check blood work today.  I will refer you to see a psychiatrist and we will also refer you to see a fibromyalgia specialist.   Please send me a list of tasks that you would like for [place] to include in your letter.  We will see you back in a year for your next physical. Please come back to see me sooner if needed.   Take care, Dr Jerline Pain  PLEASE NOTE:  If you had any lab tests please let us know if you have not heard back within a few days. You may see your results on mychart before we have a chance to review them but we will give you a call once they are reviewed by [place]. If we ordered any referrals today, please let us know if you have not heard from their office within the next week.   Please try these tips to maintain a healthy lifestyle:  Eat at least 3 REAL meals and 1-2 snacks per day.  Aim for no more than 5 hours between eating.  If you eat breakfast, please do so within one hour of getting up.   Each meal should contain half fruits/vegetables, one quarter protein, and one quarter carbs (no bigger than a computer mouse)  Cut down on sweet beverages. This includes juice, soda, and sweet tea.   Drink at least 1 glass of water with each meal and aim for at least 8 glasses per day  Exercise at least 150 minutes every week.     Preventive Care 9-24 Years Old, Female Preventive care refers to lifestyle choices and visits with your health care provider that can promote health and wellness. Preventive care visits are also called wellness exams. What can I expect for my preventive care visit? Counseling Your health care provider may ask you questions about your: Medical history, including: Past medical problems. Family medical history. Pregnancy history. Current health, including: Menstrual cycle. Method of birth control. Emotional well-being. Home life and relationship well-being. Sexual activity and sexual health. Lifestyle,  including: Alcohol, nicotine or tobacco, and drug use. Access to firearms. Diet, exercise, and sleep habits. Work and work Statistician. Sunscreen use. Safety issues such as seatbelt and bike helmet use. Physical exam Your health care provider will check your: Height and weight. These may be used to calculate your BMI (body mass index). BMI is a measurement that tells if you are at a healthy weight. Waist circumference. This measures the distance around your waistline. This measurement also tells if you are at a healthy weight and may help predict your risk of certain diseases, such as type 2 diabetes and high blood pressure. Heart rate and blood pressure. Body temperature. Skin for abnormal spots. What immunizations do I need?  Vaccines are usually given at various ages, according to a schedule. Your health care provider will recommend vaccines for you based on your age, medical history, and lifestyle or other factors, such as travel or where you work. What tests do I need? Screening Your health care provider may recommend screening tests for certain conditions. This may include: Lipid and cholesterol levels. Diabetes screening. This is done by checking your blood sugar (glucose) after you have not eaten for a while (fasting). Pelvic exam and Pap test. Hepatitis B test. Hepatitis C test. HIV (human immunodeficiency virus) test. STI (sexually transmitted infection) testing, if you are at risk. Lung cancer screening. Colorectal cancer screening. Mammogram. Talk with your health care  provider about when you should start having regular mammograms. This may depend on whether you have a family history of breast cancer. BRCA-related cancer screening. This may be done if you have a family history of breast, ovarian, tubal, or peritoneal cancers. Bone density scan. This is done to screen for osteoporosis. Talk with your health care provider about your test results, treatment options, and if  necessary, the need for more tests. Follow these instructions at home: Eating and drinking  Eat a diet that includes fresh fruits and vegetables, whole grains, lean protein, and low-fat dairy products. Take vitamin and mineral supplements as recommended by your health care provider. Do not drink alcohol if: Your health care provider tells you not to drink. You are pregnant, may be pregnant, or are planning to become pregnant. If you drink alcohol: Limit how much you have to 0-1 drink a day. Know how much alcohol is in your drink. In the [place] one drink equals one 12 oz bottle of beer (355 mL), one 5 oz glass of wine (148 mL), or one 1 oz glass of hard liquor (44 mL). Lifestyle Brush your teeth every morning and night with fluoride toothpaste. Floss one time each day. Exercise for at least 30 minutes 5 or more days each week. Do not use any products that contain nicotine or tobacco. These products include cigarettes, chewing tobacco, and vaping devices, such as e-cigarettes. If you need help quitting, ask your health care provider. Do not use drugs. If you are sexually active, practice safe sex. Use a condom or other form of protection to prevent STIs. If you do not wish to become pregnant, use a form of birth control. If you plan to become pregnant, see your health care provider for a prepregnancy visit. Take aspirin only as told by your health care provider. Make sure that you understand how much to take and what form to take. Work with your health care provider to find out whether it is safe and beneficial for you to take aspirin daily. Find healthy ways to manage stress, such as: Meditation, yoga, or listening to music. Journaling. Talking to a trusted person. Spending time with friends and family. Minimize exposure to UV radiation to reduce your risk of skin cancer. Safety Always wear your seat belt while driving or riding in a vehicle. Do not drive: If you have been drinking  alcohol. Do not ride with someone who has been drinking. When you are tired or distracted. While texting. If you have been using any mind-altering substances or drugs. Wear a helmet and other protective equipment during sports activities. If you have firearms in your house, make sure you follow all gun safety procedures. Seek help if you have been physically or sexually abused. What's next? Visit your health care provider once a year for an annual wellness visit. Ask your health care provider how often you should have your eyes and teeth checked. Stay up to date on all vaccines. This information is not intended to replace advice given to you by your health care provider. Make sure you discuss any questions you have with your health care provider. Document Revised: 06/07/2021 Document Reviewed: 06/07/2021 Elsevier Patient Education  [employer].

## 2022-05-10 NOTE — Assessment & Plan Note (Signed)
Recheck lipids

## 2022-05-10 NOTE — Progress Notes (Signed)
Chief Complaint:  Savannah George is a 41 y.o. female who presents today for her annual comprehensive physical exam.    Assessment/Plan:  Chronic Problems Addressed Today: Prediabetes Recheck A1c. She had been doing well on ozempic but insurance stopped paying for this.   Fibromyalgia Symptoms are overall stable though still significantly debilitating and severely impacting her quality of life.  We spent a significant mount of time today discussing her symptoms and treatment plan.  She has talked with her HR department and they have recommended that she obtain an ADA letter stating specific diagnoses and specific tasks and specific accommodations.  We discussed this during her office visit today including the need for her to have more time to complete task or have extra help on hand with completing tasks such as moving furniture or setting up for programs at work.  She will send me a letter via [place] in the next few days detailing all the test that she thinks she needs help with at work.  Her work also recommended she follow-up with a fibromyalgia specialist.  We will place referral to rheumatology today.  Pure hypercholesterolemia Recheck lipids.   Depression, major, single episode, moderate (HCC) Symptoms are still not controlled. No reported SI or HI. We tried cymbalta last year which she did not tolerated and we tried lexapro a few weeks ago which she also did not tolerate. She is not currently on any medications. We referred her to see a therapist at our last visit but she has not yet seen them due to limited availability but will be seeing them in a few weeks. She is still interested in trying a medication to help manage her symptoms and she is willing to see a psychiatrist. We will place referral today.   Morbid obesity ([place]) She has been doing well on Ozempic however is been off of it for the last few weeks due to insurance along her pain 4.  We are trying to get her on [facility].  She  will continue to work on diet and exercise as much she is able to.  Preventative Healthcare: Check labs. UTD on vaccines and screenings.   Patient Counseling(The following topics were reviewed and/or handout was given):  -Nutrition: Stressed importance of moderation in sodium/caffeine intake, saturated fat and cholesterol, caloric balance, sufficient intake of fresh fruits, vegetables, and fiber.  -Stressed the importance of regular exercise.   -Substance Abuse: Discussed cessation/primary prevention of tobacco, alcohol, or other drug use; driving or other dangerous activities under the influence; availability of treatment for abuse.   -Injury prevention: Discussed safety belts, safety helmets, smoke detector, smoking near bedding or upholstery.   -Sexuality: Discussed sexually transmitted diseases, partner selection, use of condoms, avoidance of unintended pregnancy and contraceptive alternatives.   -Dental health: Discussed importance of regular tooth brushing, flossing, and dental visits.  -Health maintenance and immunizations reviewed. Please refer to Health maintenance section.  Return to care in 1 year for next preventative visit.     Subjective:  HPI:  She has no acute complaints today. She is still having significant issues with her fibromyalgia and major depression. She A/P for status of chronic conditions.     Lifestyle Diet: Balanced. She has been eating more processed food due to difficulty cooking due to her depression and fibromyalgia.  Exercise: Limited due to fibromyalgia.      04/12/2022    2:39 PM  Depression screen PHQ 2/9  Decreased Interest 1  Down, Depressed, Hopeless 2  PHQ -  2 Score 3  Altered sleeping 1  Tired, decreased energy 2  Change in appetite 1  Feeling bad or failure about yourself  2  Trouble concentrating 1  Moving slowly or fidgety/restless 1  Suicidal thoughts 1  PHQ-9 Score 12  Difficult doing work/chores Very difficult    There are  no preventive care reminders to display for this patient.   ROS: Per HPI, otherwise a complete review of systems was negative.   PMH:  The following were reviewed and entered/updated in epic: Past Medical History:  Diagnosis Date   Abnormal Pap smear of cervix    age 53 or 22   Anemia    Anxiety    Back pain    Depression    DVT (deep venous thrombosis) ([place])    Fibroma    Fibromyalgia 2019   GERD (gastroesophageal reflux disease)    Hyperlipidemia    IBS (irritable bowel syndrome)    Kidney stones    Migraines    Other fatigue    Pneumonia    Poor sleep    Prediabetes    Shortness of breath on exertion    Sleep apnea    BiPAP   Thyroid disease    Urinary incontinence    Vitamin D deficiency    Patient Active Problem List   Diagnosis Date Noted   GERD (gastroesophageal reflux disease) 06/20/2020   Prediabetes 06/20/2020   IBS (irritable bowel syndrome) 03/16/2020   Chronic fatigue 02/04/2019   Fibromyalgia 02/04/2019   OSA, without need for CPAP 02/04/2019   IUD, Mirena, placed 12/25/18 02/04/2019   Urinary incontinence 11/06/2018   Sleep disorder 10/25/2018   Morbid obesity ([place]) 10/25/2018   Depression, major, single episode, moderate ([place]) 10/25/2018   Amenorrhea 10/25/2018   Family history of ovarian cancer 10/25/2018   Pure hypercholesterolemia 10/25/2018   Seasonal allergies 10/25/2018   Past Surgical History:  Procedure Laterality Date   CRYOTHERAPY     For abnormal pap smear, age 60 or 41   INTRAUTERINE DEVICE INSERTION     PARAGUARD    Family History  Problem Relation Age of Onset   Anxiety disorder Mother    Depression Mother    Kidney disease Mother    Hypertension Mother    Diabetes Mother    Kidney cancer Mother    Renal cancer Mother    Obesity Father    Sleep apnea Father    Anxiety disorder Father    Depression Father    Hyperlipidemia Father    Diabetes Father    Heart disease Father    Stroke Father    Colon polyps Father     Hypertension Father    Uterine cancer Sister    Colon polyps Sister    Endometriosis Sister    Hypertension Sister    Other Sister        Lung disease   Ovarian cancer Sister        hysterectomy   Other Brother        Lung disease   Diabetes Paternal Grandmother    Heart disease Paternal Grandmother        CHF   Diabetes Paternal Grandfather    Stroke Paternal Grandfather    Heart attack Paternal Grandfather    Kidney disease Maternal Uncle    Diabetes Paternal Aunt    Heart disease Other    Depression Other    Anxiety disorder Other    Sleep apnea Other    Obesity Other  Medications- reviewed and updated Current Outpatient Medications  Medication Sig Dispense Refill   Acetaminophen (PAIN RELIEF PO) Take 1 tablet by mouth daily.     levonorgestrel (MIRENA) 20 MCG/24HR IUD 1 each by Intrauterine route once.     Multiple Vitamins-Minerals (MULTIVITAMIN PO) Take 1 tablet by mouth daily.      naproxen sodium (ALEVE) 220 MG tablet Take 220 mg by mouth daily as needed.     ondansetron (ZOFRAN) 8 MG tablet Take 1 tablet (8 mg total) by mouth every 8 (eight) hours as needed for nausea or vomiting. 20 tablet 0   OVER THE COUNTER MEDICATION Take 1 capsule by mouth at bedtime. Sleep Tonight Sleep Aide. Contains mix of Ashwagandha, root & leaf extract, L-Thyronine ('250mg'$ );  Cortisol-Reducing blend containing magnolia bark extract ('250mg'$ ); Soy Lecithin ('50mg'$ )     pantoprazole (PROTONIX) 40 MG tablet Take 1 tablet (40 mg total) by mouth 2 (two) times daily before a meal. **PLEASE CONTACT OFFICE TO SCHEDULE FOLLOW UP 60 tablet 5   Probiotic Product (PROBIOTIC PEARLS WOMENS PO) Take 1 each by mouth daily.     Semaglutide-Weight Management (WEGOVY) 0.25 MG/0.5ML SOAJ Inject 0.25 mg into the skin once a week. For 4 weeks 2 mL 0   tiZANidine (ZANAFLEX) 4 MG tablet TAKE 1 TABLET BY MOUTH EVERY 6 HOURS AS NEEDED FOR MUSCLE SPASMS. 90 tablet 1   No current facility-administered medications  for this visit.    Allergies-reviewed and updated Allergies  Allergen Reactions   Aspirin Hives   Latex Hives    Social History   Socioeconomic History   Marital status: Divorced    Spouse name: Not on file   Number of children: 0   Years of education: Not on file   Highest education level: Master's degree (e.g., MA, MS, MEng, MEd, MSW, MBA)  Occupational History   Occupation: Tree surgeon: [employer]  Tobacco Use   Smoking status: Never   Smokeless tobacco: Never  Vaping Use   Vaping Use: Never used  Substance and Sexual Activity   Alcohol use: Yes    Alcohol/week: 0.0 - 3.0 standard drinks    Comment: occasional   Drug use: Never   Sexual activity: Not Currently    Partners: Male, Female    Birth control/protection: I.U.D.  Other Topics Concern   Not on file  Social History Narrative   Recently moved back to [place] after divorcing husband. Went to college here and still has friends here. Enjoys hiking. Trying to integrate herself back into the community.    Social Determinants of Health   Financial Resource Strain: Not on file  Food Insecurity: Not on file  Transportation Needs: Not on file  Physical Activity: Not on file  Stress: Not on file  Social Connections: Not on file        Objective:  Physical Exam: BP 130/86   Pulse 62   Temp 98.2 F (36.8 C)   Ht '5\' 2"'$  (1.575 m)   Wt 199 lb 6.4 oz (90.4 kg)   SpO2 100%   BMI 36.47 kg/m   Body mass index is 36.47 kg/m. Wt Readings from Last 3 Encounters:  05/10/22 199 lb 6.4 oz (90.4 kg)  04/12/22 196 lb 9.6 oz (89.2 kg)  10/24/21 197 lb (89.4 kg)  Gen: NAD, resting comfortably HEENT: TMs normal bilaterally. OP clear. No thyromegaly noted.  CV: RRR with no murmurs appreciated Pulm: NWOB, CTAB with no crackles, wheezes, or rhonchi GI: Normal  bowel sounds present. Soft, Nontender, Nondistended. MSK: no edema, cyanosis, or clubbing noted Skin: warm, dry Neuro: CN2-12 grossly  intact. Strength 5/5 in upper and lower extremities. Reflexes symmetric and intact bilaterally.  Psych: Normal affect and thought content      Niamya Vittitow M. Jerline Pain, MD 05/10/2022 9:07 AM

## 2022-05-11 ENCOUNTER — Ambulatory Visit: Payer: 59 | Admitting: Physical Therapy

## 2022-05-11 ENCOUNTER — Encounter: Payer: Self-pay | Admitting: Physical Therapy

## 2022-05-11 DIAGNOSIS — M6281 Muscle weakness (generalized): Secondary | ICD-10-CM

## 2022-05-11 DIAGNOSIS — M25562 Pain in left knee: Secondary | ICD-10-CM

## 2022-05-11 NOTE — Therapy (Signed)
OUTPATIENT PHYSICAL THERAPY LOWER EXTREMITY EVALUATION   Patient Name: Savannah George MRN: [id number] DOB:April 13, 1981, 41 y.o., female Today's Date: 05/11/2022   PT End of Session - 05/11/22 1351     Visit Number 1    Number of Visits 12    Date for PT Re-Evaluation 06/22/22    Authorization Type UHC    PT Start Time [phone]    PT Stop Time 1015    PT Time Calculation (min) 37 min    Activity Tolerance Patient tolerated treatment well    Behavior During Therapy WFL for tasks assessed/performed             Past Medical History:  Diagnosis Date   Abnormal Pap smear of cervix    age 59 or 37   Anemia    Anxiety    Back pain    Depression    DVT (deep venous thrombosis) ([place])    Fibroma    Fibromyalgia 2019   GERD (gastroesophageal reflux disease)    Hyperlipidemia    IBS (irritable bowel syndrome)    Kidney stones    Migraines    Other fatigue    Pneumonia    Poor sleep    Prediabetes    Shortness of breath on exertion    Sleep apnea    BiPAP   Thyroid disease    Urinary incontinence    Vitamin D deficiency    Past Surgical History:  Procedure Laterality Date   CRYOTHERAPY     For abnormal pap smear, age 37 or 35   INTRAUTERINE DEVICE INSERTION     PARAGUARD   Patient Active Problem List   Diagnosis Date Noted   GERD (gastroesophageal reflux disease) 06/20/2020   Prediabetes 06/20/2020   IBS (irritable bowel syndrome) 03/16/2020   Chronic fatigue 02/04/2019   Fibromyalgia 02/04/2019   OSA, without need for CPAP 02/04/2019   IUD, Mirena, placed 12/25/18 02/04/2019   Urinary incontinence 11/06/2018   Sleep disorder 10/25/2018   Morbid obesity ([place]) 10/25/2018   Depression, major, single episode, moderate ([place]) 10/25/2018   Amenorrhea 10/25/2018   Family history of ovarian cancer 10/25/2018   Pure hypercholesterolemia 10/25/2018   Seasonal allergies 10/25/2018    PCP: Hetty Blend Paker  REFERRING PROVIDER: Dimas Chyle  REFERRING DIAG: L knee pain,  fibromyalgia   THERAPY DIAG:  Acute pain of left knee  Muscle weakness (generalized)  Rationale for Evaluation and Treatment Rehabilitation  ONSET DATE:   SUBJECTIVE:   SUBJECTIVE STATEMENT:  Pt states L knee pain, for about 1 month, no injury to report. Notes when L knee is sore, L hip also gets sore.  L Knee: creaking/cracking, Stairs difficult,  Home: just a few stairs to leave house.  Work: Development worker, community, push carts, some lifting. Shelving.    Also has Fibromyalgia which she feels is significantly impacting her mobility. States some good days, some very bad, with poor ability for mobility , brain fog, and increased pain. Has widespread pain, worst in her quads, shoulder blades, she has exercised in pool in the past and liked that.     PERTINENT HISTORY: Fibromyalgia,   PAIN:  Are you having pain? Yes: NPRS scale: 1-5/10 Pain location: L knee Pain description: sore, painful Aggravating factors: increased activity  Relieving factors: none stated   PRECAUTIONS: None  WEIGHT BEARING RESTRICTIONS No  FALLS:  Has patient fallen in last 6 months? Yes. Number of falls 0   PLOF: Independent  PATIENT GOALS Decreased pain in Knee, improved mobility  OBJECTIVE:    COGNITION:  Overall cognitive status: Within functional limits for tasks assessed    POSTURE: poor seated posture with rounded shoulders and back.   PALPATION: Tenderness in L gr troch, into ITB,   LOWER EXTREMITY ROM:  Hips: WFL,  Knees: WFL   LOWER EXTREMITY MMT:  Hips: 4-/5, Knee: 4/5    TODAY'S TREATMENT: See below for HEP   PATIENT EDUCATION:  Education details: PT POC, exam findings,  Person educated: Patient Education method: Explanation, Demonstration, Tactile cues, and Verbal cues Education comprehension: verbalized understanding, returned demonstration, verbal cues required, tactile cues required, and needs further education   HOME EXERCISE PROGRAM: LAQ 2 x 5 bil;    ASSESSMENT:  CLINICAL IMPRESSION: Pt presents with primary complaint of increased pain in L knee. She also has widespread pain and weakness from fibromyalgia that makes her mobility more difficult. She has soreness in knee, quad, as well as into L gr troch and ITB. She has weakness in quads and LEs. She has decreased ability for functional activity , due to pain and weakness, and variable energy levels/fatigue. Pt with difficulty maintaining exercise program in the past, but did like aquatic exercise. Will discuss aquatic exercise further, as well as other home pain relief ideas, Tens unit and/or percussion gun. Pt to benefit from skilled PT to improve deficits and pain.    OBJECTIVE IMPAIRMENTS decreased activity tolerance, decreased endurance, decreased knowledge of use of DME, decreased mobility, decreased strength, increased muscle spasms, improper body mechanics, and pain.   ACTIVITY LIMITATIONS meal prep, cleaning, laundry, driving, shopping, community activity, occupation, and yard work.   PERSONAL FACTORS  Fibromyalgia  are also affecting patient's functional outcome.    REHAB POTENTIAL: Good  CLINICAL DECISION MAKING: Stable/uncomplicated  EVALUATION COMPLEXITY: Low   GOALS: Goals reviewed with patient? Yes  SHORT TERM GOALS: Target date: 05/25/22  Pt to be independent with initial HEP  Goal status: INITIAL    LONG TERM GOALS: Target date: 06/22/2022   Pt to be independent  with final HEP  Goal status: INITIAL  2.  Pt to report decreased pain in L knee, to 0-3/10 with activity.   Goal status: INITIAL  3. Pt to voice 1-2 home pain relief strategies for days where she is in more pain from fibromyalgia.   Goal status: INITIAL  4.  Pt to demo improved strength of hips to at least 4/5 to improve pain,  ambulation and stairs.   Goal status: INITIAL  5. Pt to demo ability for bend, lift squat with optimal mechanics for back and knee, without pain greater than 3/10,  to improve ability for job duties.       PLAN: PT FREQUENCY: 1-2x/week  PT DURATION: 6 weeks  PLANNED INTERVENTIONS: Therapeutic exercises, Therapeutic activity, Neuromuscular re-education, Balance training, Gait training, Patient/Family education, Joint manipulation, Joint mobilization, Stair training, DME instructions, Aquatic Therapy, Dry Needling, Electrical stimulation, Spinal manipulation, Spinal mobilization, Cryotherapy, Moist heat, Taping, Vasopneumatic device, Traction, Ultrasound, Ionotophoresis '4mg'$ /ml Dexamethasone, and Manual therapy  PLAN FOR NEXT SESSION:  HEP for light hip, quad strength, low hip stretches, Discuss home Tens unit and/or percussion gun.     Lyndee Hensen, PT, DPT 2:03 PM  05/11/22

## 2022-05-14 NOTE — Progress Notes (Signed)
Please inform patient of the following:  Her cholesterol and blood sugar levels are borderline elevated but stable.  We do not need to start any medications for this however she should continue to work on diet and exercise and we can recheck in a year.  Everything else is stable.

## 2022-05-24 ENCOUNTER — Ambulatory Visit (INDEPENDENT_AMBULATORY_CARE_PROVIDER_SITE_OTHER): Payer: 59 | Admitting: Obstetrics and Gynecology

## 2022-05-24 ENCOUNTER — Encounter: Payer: Self-pay | Admitting: Physical Therapy

## 2022-05-24 ENCOUNTER — Ambulatory Visit: Payer: 59 | Admitting: Physical Therapy

## 2022-05-24 ENCOUNTER — Encounter: Payer: Self-pay | Admitting: Obstetrics and Gynecology

## 2022-05-24 VITALS — BP 128/74 | HR 63 | Ht 61.0 in | Wt 201.0 lb

## 2022-05-24 DIAGNOSIS — Z01419 Encounter for gynecological examination (general) (routine) without abnormal findings: Secondary | ICD-10-CM | POA: Diagnosis not present

## 2022-05-24 DIAGNOSIS — M6281 Muscle weakness (generalized): Secondary | ICD-10-CM

## 2022-05-24 DIAGNOSIS — Z8371 Family history of colonic polyps: Secondary | ICD-10-CM

## 2022-05-24 DIAGNOSIS — Z23 Encounter for immunization: Secondary | ICD-10-CM | POA: Diagnosis not present

## 2022-05-24 DIAGNOSIS — Z30431 Encounter for routine checking of intrauterine contraceptive device: Secondary | ICD-10-CM | POA: Diagnosis not present

## 2022-05-24 DIAGNOSIS — M25562 Pain in left knee: Secondary | ICD-10-CM | POA: Diagnosis not present

## 2022-05-24 DIAGNOSIS — Z113 Encounter for screening for infections with a predominantly sexual mode of transmission: Secondary | ICD-10-CM

## 2022-05-24 DIAGNOSIS — Z83719 Family history of colon polyps, unspecified: Secondary | ICD-10-CM

## 2022-05-24 DIAGNOSIS — N644 Mastodynia: Secondary | ICD-10-CM

## 2022-05-24 DIAGNOSIS — N898 Other specified noninflammatory disorders of vagina: Secondary | ICD-10-CM

## 2022-05-24 LAB — WET PREP FOR TRICH, YEAST, CLUE

## 2022-05-24 NOTE — Progress Notes (Signed)
41 y.o. G0P0000 Divorced White or Caucasian Not Hispanic or Latino female here for annual exam.  She has a mirena IUD, inserted on 12/25/18. No bleeding.   Sexually active with more than one partner, female and female/transgender. Her steady partner is a transgender female, they live together. She had orchiectomy and has had vaginoplasty. Would like full STD testing, has a h/o cold sores.   She is having some lumps under her skin in the groin area. Come and go, can be painful, can drain pus (not always).   She is having bilateral breast aching/tenderness. Started a couple of weeks ago, wondering if it is her fibromyalgia.  She was on Ozempic, has lost over 20 lbs.    Struggles with fibromyalgia. Needs a specialist.   Doesn't sleep well, on CPAP.   No LMP recorded. (Menstrual status: IUD).          Sexually active: Yes.    The current method of family planning is IUD.    Exercising: No.  The patient does not participate in regular exercise at present. Smoker:  no  Health Maintenance: Pap:  04/17/21 WNL Hr HPV Neg; 10/10/18 WNL Hr Hpv neg  History of abnormal Pap:  yes, cryosurgery age 44 or 33  MMG:  02/26/20 Bi-rads 1 neg ([facility]) BMD:   none  Colonoscopy: none  TDaP:  2020 Gardasil: x1    reports that she has never smoked. She has never used smokeless tobacco. She reports current alcohol use. She reports that she does not use drugs. Just occasional ETOH. She is a Licensed conveyancer at [employer].   Past Medical History:  Diagnosis Date   Abnormal Pap smear of cervix    age 36 or 48   Anemia    Anxiety    Back pain    Depression    DVT (deep venous thrombosis) ([facility])    Fibroma    Fibromyalgia 2019   GERD (gastroesophageal reflux disease)    Hyperlipidemia    IBS (irritable bowel syndrome)    Kidney stones    Migraines    Other fatigue    Pneumonia    Poor sleep    Prediabetes    Shortness of breath on exertion    Sleep apnea    BiPAP   Thyroid disease    Urinary  incontinence    Vitamin D deficiency     Past Surgical History:  Procedure Laterality Date   CRYOTHERAPY     For abnormal pap smear, age 45 or 52   INTRAUTERINE DEVICE INSERTION     PARAGUARD    Current Outpatient Medications  Medication Sig Dispense Refill   Acetaminophen (PAIN RELIEF PO) Take 1 tablet by mouth daily.     levonorgestrel (MIRENA) 20 MCG/24HR IUD 1 each by Intrauterine route once.     Multiple Vitamins-Minerals (MULTIVITAMIN PO) Take 1 tablet by mouth daily.      naproxen sodium (ALEVE) 220 MG tablet Take 220 mg by mouth daily as needed.     ondansetron (ZOFRAN) 8 MG tablet Take 1 tablet (8 mg total) by mouth every 8 (eight) hours as needed for nausea or vomiting. 20 tablet 0   Probiotic Product (PROBIOTIC PEARLS WOMENS PO) Take 1 each by mouth daily.     Semaglutide-Weight Management (WEGOVY) 0.25 MG/0.5ML SOAJ Inject 0.25 mg into the skin once a week. For 4 weeks 2 mL 0   tiZANidine (ZANAFLEX) 4 MG tablet TAKE 1 TABLET BY MOUTH EVERY 6 HOURS AS NEEDED FOR  MUSCLE SPASMS. 90 tablet 1   pantoprazole (PROTONIX) 40 MG tablet Take 1 tablet (40 mg total) by mouth 2 (two) times daily before a meal. **PLEASE CONTACT OFFICE TO SCHEDULE FOLLOW UP 60 tablet 5   No current facility-administered medications for this visit.    Family History  Problem Relation Age of Onset   Anxiety disorder Mother    Depression Mother    Kidney disease Mother    Hypertension Mother    Diabetes Mother    Kidney cancer Mother    Renal cancer Mother    Obesity Father    Sleep apnea Father    Anxiety disorder Father    Depression Father    Hyperlipidemia Father    Diabetes Father    Heart disease Father    Stroke Father    Colon polyps Father    Hypertension Father    Uterine cancer Sister    Colon polyps Sister    Endometriosis Sister    Hypertension Sister    Other Sister        Lung disease   Ovarian cancer Sister        hysterectomy   Other Brother        Lung disease    Diabetes Paternal Grandmother    Heart disease Paternal Grandmother        CHF   Diabetes Paternal Grandfather    Stroke Paternal Grandfather    Heart attack Paternal Grandfather    Kidney disease Maternal Uncle    Diabetes Paternal Aunt    Heart disease Other    Depression Other    Anxiety disorder Other    Sleep apnea Other    Obesity Other   Sister has been doing colon cancer screening for years, has polyps, currently 51. Dad with a h/o polyps.   Review of Systems  Exam:   BP 128/74   Pulse 63   Ht '5\' 1"'$  (1.549 m)   Wt 201 lb (91.2 kg)   SpO2 99%   BMI 37.98 kg/m   Weight change: '@WEIGHTCHANGE'$ @ Height:   Height: '5\' 1"'$  (154.9 cm)  Ht Readings from Last 3 Encounters:  05/24/22 '5\' 1"'$  (1.549 m)  05/10/22 '5\' 2"'$  (1.575 m)  04/12/22 '5\' 2"'$  (1.575 m)    General appearance: alert, cooperative and appears stated age Head: Normocephalic, without obvious abnormality, atraumatic Neck: no adenopathy, supple, symmetrical, trachea midline and thyroid normal to inspection and palpation Lungs: clear to auscultation bilaterally Cardiovascular: regular rate and rhythm Breasts: normal appearance, no masses or tenderness Abdomen: soft, non-tender; non distended,  no masses,  no organomegaly Extremities: extremities normal, atraumatic, no cyanosis or edema Skin: Skin color, texture, turgor normal. No rashes or lesions Lymph nodes: Cervical, supraclavicular, and axillary nodes normal. No abnormal inguinal nodes palpated Neurologic: Grossly normal   Pelvic: External genitalia:  no lesions              Urethra:  normal appearing urethra with no masses, tenderness or lesions              Bartholins and Skenes: normal                 Vagina: normal appearing vagina with an increase in white, creamy vaginal d/c              Cervix: no lesions and IUD string 4 cm               Bimanual Exam:  Uterus:   no masses  or tenderness              Adnexa: no mass, fullness, tenderness                Rectovaginal: Confirms               Anus:  normal sphincter tone, no lesions  Gae Dry chaperoned for the exam.      1. Well woman exam Discussed breast self exam Discussed calcium and vit D intake No pap this year Mammogram overdue, she will schedule  2. Family history of colonic polyps - Ambulatory referral to Gastroenterology  3. Screening examination for STD (sexually transmitted disease) - HSV(herpes simplex vrs) 1+2 ab-IgG - Hepatitis C antibody - RPR - HIV Antibody (routine testing w rflx) - SURESWAB CT/NG/T. vaginalis  4. Immunization due 2nd shot today - HPV 9-valent vaccine,Recombinat  5. IUD check up Doing well  6. Breast tenderness Bilateral, just for the last few weeks. Normal exam, not tender.  Suspect hormonal or her fibromyalgia. Information given, reach out if she has persistent, focal pain  7. Vaginal discharge On questioning the patient has noted an increase in vaginal discharge.  - [place]

## 2022-05-24 NOTE — Therapy (Signed)
OUTPATIENT PHYSICAL THERAPY TREATMENT NOTE   Patient Name: Savannah George MRN: [id number] DOB:02/28/1981, 41 y.o., female Today's Date: 05/24/2022   END OF SESSION:   PT End of Session - 05/24/22 1432     Visit Number 2    Number of Visits 12    Date for PT Re-Evaluation 06/22/22    Authorization Type [employer]    PT Start Time 1740    PT Stop Time 1515    PT Time Calculation (min) 41 min    Activity Tolerance Patient tolerated treatment well    Behavior During Therapy WFL for tasks assessed/performed             Past Medical History:  Diagnosis Date   Abnormal Pap smear of cervix    age 32 or 54   Anemia    Anxiety    Back pain    Depression    DVT (deep venous thrombosis) ([place])    Fibroma    Fibromyalgia 2019   GERD (gastroesophageal reflux disease)    Hyperlipidemia    IBS (irritable bowel syndrome)    Kidney stones    Migraines    Other fatigue    Pneumonia    Poor sleep    Prediabetes    Shortness of breath on exertion    Sleep apnea    BiPAP   Thyroid disease    Urinary incontinence    Vitamin D deficiency    Past Surgical History:  Procedure Laterality Date   CRYOTHERAPY     For abnormal pap smear, age 70 or 20   INTRAUTERINE DEVICE INSERTION     PARAGUARD   Patient Active Problem List   Diagnosis Date Noted   GERD (gastroesophageal reflux disease) 06/20/2020   Prediabetes 06/20/2020   IBS (irritable bowel syndrome) 03/16/2020   Chronic fatigue 02/04/2019   Fibromyalgia 02/04/2019   OSA, without need for CPAP 02/04/2019   IUD, Mirena, placed 12/25/18 02/04/2019   Urinary incontinence 11/06/2018   Sleep disorder 10/25/2018   Morbid obesity ([place]) 10/25/2018   Depression, major, single episode, moderate ([place]) 10/25/2018   Amenorrhea 10/25/2018   Family history of ovarian cancer 10/25/2018   Pure hypercholesterolemia 10/25/2018   Seasonal allergies 10/25/2018   PCP: Hetty Blend Paker   REFERRING PROVIDER: Dimas Chyle   REFERRING DIAG: L knee pain,  fibromyalgia    THERAPY DIAG:  Acute pain of left knee   Muscle weakness (generalized)   Rationale for Evaluation and Treatment Rehabilitation   ONSET DATE:    SUBJECTIVE:    SUBJECTIVE STATEMENT:    05/24/2022   States she feels about the same. States that earlier today she had difficulty putting weight on her leg after sitting for 3 hours.     Eval: Pt states L knee pain, for about 1 month, no injury to report. Notes when L knee is sore, L hip also gets sore.  L Knee: creaking/cracking, Stairs difficult,  Home: just a few stairs to leave house.  Work: Development worker, community, push carts, some lifting. Shelving.    Also has Fibromyalgia which she feels is significantly impacting her mobility. States some good days, some very bad, with poor ability for mobility , brain fog, and increased pain. Has widespread pain, worst in her quads, shoulder blades, she has exercised in pool in the past and liked that.        PERTINENT HISTORY: Fibromyalgia,    PAIN:  Are you having pain? Yes: NPRS scale: 1-5/10 Pain location: L knee Pain description:  sore, painful Aggravating factors: increased activity  Relieving factors: none stated    PRECAUTIONS: None   WEIGHT BEARING RESTRICTIONS No   FALLS:  Has patient fallen in last 6 months? Yes. Number of falls 0     PLOF: Independent   PATIENT GOALS Decreased pain in Knee, improved mobility      OBJECTIVE:      COGNITION:           Overall cognitive status: Within functional limits for tasks assessed                 POSTURE: poor seated posture with rounded shoulders and back.    PALPATION: Tenderness in L gr troch, into ITB,    LOWER EXTREMITY ROM:   Hips: WFL,  Knees: WFL     LOWER EXTREMITY MMT:   Hips: 4-/5, Knee: 4/5      TODAY'S TREATMENT: 05/24/2022 Therapeutic Exercise:  Aerobic: bike level 2, 8 minutes  Supine: Prone:  Seated: self use of percussion gun after prior demo for vibration on cushion and on leg  with layers - 15 minutes total, LAQs x15 B 5" holds, hip add isometrics x25 5" holds   Standing: Neuromuscular Re-education: Manual Therapy: Therapeutic Activity: Self Care: Trigger Point Dry Needling:  Modalities:       PATIENT EDUCATION:  Education details: HEP, on percussion gun, on how to use for vibration, on TENS unit, on different, on you tube videos for water areobics  Person educated: Patient Education method: Explanation, Demonstration, Tactile cues, and Verbal cues Education comprehension: verbalized understanding, returned demonstration, verbal cues required, tactile cues required, and needs further education     HOME EXERCISE PROGRAM:    ASSESSMENT:   CLINICAL IMPRESSION: 05/24/2022 Session focused on education of percussion gun nd its use. Patient preferred to use vibration setting and indirect contact for this. Discussed using youtube to fine water aerobics classes to use when she is in the pool secondary to classes not being at a consistent time. Will trial TENS/IFC next session.   Eval: Pt presents with primary complaint of increased pain in L knee. She also has widespread pain and weakness from fibromyalgia that makes her mobility more difficult. She has soreness in knee, quad, as well as into L gr troch and ITB. She has weakness in quads and LEs. She has decreased ability for functional activity , due to pain and weakness, and variable energy levels/fatigue. Pt with difficulty maintaining exercise program in the past, but did like aquatic exercise. Will discuss aquatic exercise further, as well as other home pain relief ideas, Tens unit and/or percussion gun. Pt to benefit from skilled PT to improve deficits and pain.      OBJECTIVE IMPAIRMENTS decreased activity tolerance, decreased endurance, decreased knowledge of use of DME, decreased mobility, decreased strength, increased muscle spasms, improper body mechanics, and pain.    ACTIVITY LIMITATIONS meal prep,  cleaning, laundry, driving, shopping, community activity, occupation, and yard work.    PERSONAL FACTORS  Fibromyalgia  are also affecting patient's functional outcome.      REHAB POTENTIAL: Good   CLINICAL DECISION MAKING: Stable/uncomplicated   EVALUATION COMPLEXITY: Low     GOALS: Goals reviewed with patient? Yes   SHORT TERM GOALS: Target date: 05/25/22   Pt to be independent with initial HEP   Goal status: INITIAL       LONG TERM GOALS: Target date: 06/22/2022    Pt to be independent  with final HEP   Goal status: INITIAL  2.  Pt to report decreased pain in L knee, to 0-3/10 with activity.    Goal status: INITIAL   3. Pt to voice 1-2 home pain relief strategies for days where she is in more pain from fibromyalgia.    Goal status: INITIAL   4.  Pt to demo improved strength of hips to at least 4/5 to improve pain,  ambulation and stairs.    Goal status: INITIAL   5. Pt to demo ability for bend, lift squat with optimal mechanics for back and knee, without pain greater than 3/10, to improve ability for job duties.            PLAN: PT FREQUENCY: 1-2x/week   PT DURATION: 6 weeks   PLANNED INTERVENTIONS: Therapeutic exercises, Therapeutic activity, Neuromuscular re-education, Balance training, Gait training, Patient/Family education, Joint manipulation, Joint mobilization, Stair training, DME instructions, Aquatic Therapy, Dry Needling, Electrical stimulation, Spinal manipulation, Spinal mobilization, Cryotherapy, Moist heat, Taping, Vasopneumatic device, Traction, Ultrasound, Ionotophoresis '4mg'$ /ml Dexamethasone, and Manual therapy   PLAN FOR NEXT SESSION:  trial IFC. HEP for light hip, quad strength, low hip stretches, Discuss home Tens unit and/or percussion gun.     3:16 PM, 05/24/22 Jerene Pitch, DPT Physical Therapy with [facility]

## 2022-05-24 NOTE — Patient Instructions (Addendum)
PLEASE SCHEDULE A MAMMOGRAM  Breast Tenderness Breast tenderness is a common problem for women of all ages, but may also occur in men. Breast tenderness may range from mild discomfort to severe pain. In women, the pain usually comes and goes with the menstrual cycle, but it can also be constant. Breast tenderness has many possible causes, including hormone changes, infections, and taking certain medicines. You may have tests, such as a mammogram or an ultrasound, to check for any unusual findings. Having breast tenderness usually does not mean that you have breast cancer. Follow these instructions at home: Managing pain and discomfort  If directed, put ice to the painful area. To do this: Put ice in a plastic bag. Place a towel between your skin and the bag. Leave the ice on for 20 minutes, 2-3 times a day. Wear a supportive bra, especially during exercise. You may also want to wear a supportive bra while sleeping if your breasts are very tender. Medicines Take over-the-counter and prescription medicines only as told by your health care provider. If the cause of your pain is infection, you may be prescribed an antibiotic medicine. If you were prescribed an antibiotic, take it as told by your health care provider. Do not stop taking the antibiotic even if you start to feel better. Eating and drinking Your health care provider may recommend that you lessen the amount of fat in your diet. You can do this by: Limiting fried foods. Cooking foods using methods such as baking, boiling, grilling, and broiling. Decrease the amount of caffeine in your diet. Instead, drink more water and choose caffeine-free drinks. General instructions  Keep a log of the days and times when your breasts are most tender. Ask your health care provider how to do breast exams at home. This will help you notice if you have an unusual growth or lump. Keep all follow-up visits as told by your health care provider. This is  important. Contact a health care provider if: Any part of your breast is hard, red, and hot to the touch. This may be a sign of infection. You are a woman and: Not breastfeeding and you have fluid, especially blood or pus, coming out of your nipples. Have a new or painful lump in your breast that remains after your menstrual period ends. You have a fever. Your pain does not improve or it gets worse. Your pain is interfering with your daily activities. Summary Breast tenderness may range from mild discomfort to severe pain. Breast tenderness has many possible causes, including hormone changes, infections, and taking certain medicines. It can be treated with ice, wearing a supportive bra, and medicines. Make changes to your diet if told to by your health care provider. This information is not intended to replace advice given to you by your health care provider. Make sure you discuss any questions you have with your health care provider. Document Revised: 05/04/2019 Document Reviewed: 05/04/2019 Elsevier Patient Education  [place]   We recommended that you start or continue a regular exercise program for good health. Physical activity is anything that gets your body moving, some is better than none. The CDC recommends 150 minutes per week of Moderate-Intensity Aerobic Activity and 2 or more days of Muscle Strengthening Activity.  Benefits of exercise are limitless: helps weight loss/weight maintenance, improves mood and energy, helps with depression and anxiety, improves sleep, tones and strengthens muscles, improves balance, improves bone density, protects from chronic conditions such as heart  disease, high blood pressure and diabetes and so much more. To learn more visit: WhyNotPoker.uy  DIET: Good nutrition starts with a healthy diet of fruits, vegetables, whole grains, and lean protein sources. Drink plenty of water for hydration.  Minimize empty calories, sodium, sweets. For more information about dietary recommendations visit: GeekRegister.com.ee and http://schaefer-mitchell.com/  ALCOHOL:  Women should limit their alcohol intake to no more than 7 drinks/beers/glasses of wine (combined, not each!) per week. Moderation of alcohol intake to this level decreases your risk of breast cancer and liver damage.  If you are concerned that you may have a problem, or your friends have told you they are concerned about your drinking, there are many resources to help. A well-known program that is free, effective, and available to all people all over the nation is Alcoholics Anonymous.  Check out this site to learn more: BlockTaxes.se   CALCIUM AND VITAMIN D:  Adequate intake of calcium and Vitamin D are recommended for bone health.  You should be getting between 1000-1200 mg of calcium and 800 units of Vitamin D daily between diet and supplements  PAP SMEARS:  Pap smears, to check for cervical cancer or precancers,  have traditionally been done yearly, scientific advances have shown that most women can have pap smears less often.  However, every woman still should have a physical exam from her gynecologist every year. It will include a breast check, inspection of the vulva and vagina to check for abnormal growths or skin changes, a visual exam of the cervix, and then an exam to evaluate the size and shape of the uterus and ovaries. We will also provide age appropriate advice regarding health maintenance, like when you should have certain vaccines, screening for sexually transmitted diseases, bone density testing, colonoscopy, mammograms, etc.   MAMMOGRAMS:  All women over 82 years old should have a routine mammogram.   COLON CANCER SCREENING: Now recommend starting at age 58. At this time colonoscopy is not covered for routine screening until 50. There are take home tests that can be done  between 45-49.   COLONOSCOPY:  Colonoscopy to screen for colon cancer is recommended for all women at age 49.  We know, you hate the idea of the prep.  We agree, BUT, having colon cancer and not knowing it is worse!!  Colon cancer so often starts as a polyp that can be seen and removed at colonscopy, which can quite literally save your life!  And if your first colonoscopy is normal and you have no family history of colon cancer, most women don't have to have it again for 10 years.  Once every ten years, you can do something that may end up saving your life, right?  We will be happy to help you get it scheduled when you are ready.  Be sure to check your insurance coverage so you understand how much it will cost.  It may be covered as a preventative service at no cost, but you should check your particular policy.      Breast Self-Awareness Breast self-awareness means being familiar with how your breasts look and feel. It involves checking your breasts regularly and reporting any changes to your health care provider. Practicing breast self-awareness is important. A change in your breasts can be a sign of a serious medical problem. Being familiar with how your breasts look and feel allows you to find any problems early, when treatment is more likely to be successful. All women should practice breast self-awareness, including  women who have had breast implants. How to do a breast self-exam One way to learn what is normal for your breasts and whether your breasts are changing is to do a breast self-exam. To do a breast self-exam: Look for Changes  Remove all the clothing above your waist. Stand in front of a mirror in a room with good lighting. Put your hands on your hips. Push your hands firmly downward. Compare your breasts in the mirror. Look for differences between them (asymmetry), such as: Differences in shape. Differences in size. Puckers, dips, and bumps in one breast and not the other. Look  at each breast for changes in your skin, such as: Redness. Scaly areas. Look for changes in your nipples, such as: Discharge. Bleeding. Dimpling. Redness. A change in position. Feel for Changes Carefully feel your breasts for lumps and changes. It is best to do this while lying on your back on the floor and again while sitting or standing in the shower or tub with soapy water on your skin. Feel each breast in the following way: Place the arm on the side of the breast you are examining above your head. Feel your breast with the other hand. Start in the nipple area and make  inch (2 cm) overlapping circles to feel your breast. Use the pads of your three middle fingers to do this. Apply light pressure, then medium pressure, then firm pressure. The light pressure will allow you to feel the tissue closest to the skin. The medium pressure will allow you to feel the tissue that is a little deeper. The firm pressure will allow you to feel the tissue close to the ribs. Continue the overlapping circles, moving downward over the breast until you feel your ribs below your breast. Move one finger-width toward the center of the body. Continue to use the  inch (2 cm) overlapping circles to feel your breast as you move slowly up toward your collarbone. Continue the up and down exam using all three pressures until you reach your armpit.  Write Down What You Find  Write down what is normal for each breast and any changes that you find. Keep a written record with breast changes or normal findings for each breast. By writing this information down, you do not need to depend only on memory for size, tenderness, or location. Write down where you are in your menstrual cycle, if you are still menstruating. If you are having trouble noticing differences in your breasts, do not get discouraged. With time you will become more familiar with the variations in your breasts and more comfortable with the exam. How often  should I examine my breasts? Examine your breasts every month. If you are breastfeeding, the best time to examine your breasts is after a feeding or after using a breast pump. If you menstruate, the best time to examine your breasts is 5-7 days after your period is over. During your period, your breasts are lumpier, and it may be more difficult to notice changes. When should I see my health care provider? See your health care provider if you notice: A change in shape or size of your breasts or nipples. A change in the skin of your breast or nipples, such as a reddened or scaly area. Unusual discharge from your nipples. A lump or thick area that was not there before. Pain in your breasts. Anything that concerns you.

## 2022-05-25 ENCOUNTER — Telehealth: Payer: Self-pay | Admitting: *Deleted

## 2022-05-25 ENCOUNTER — Telehealth: Payer: Self-pay | Admitting: Family Medicine

## 2022-05-25 LAB — RPR: RPR Ser Ql: NONREACTIVE

## 2022-05-25 LAB — HEPATITIS C ANTIBODY
Hepatitis C Ab: NONREACTIVE
SIGNAL TO CUT-OFF: 0.06 (ref <1.00)

## 2022-05-25 LAB — HSV(HERPES SIMPLEX VRS) I + II AB-IGG
HAV 1 IGG,TYPE SPECIFIC AB: 39.7 index — ABNORMAL HIGH
HSV 2 IGG,TYPE SPECIFIC AB: <0.9 index

## 2022-05-25 LAB — HIV ANTIBODY (ROUTINE TESTING W REFLEX): HIV 1&2 Ab, 4th Generation: NONREACTIVE

## 2022-05-25 NOTE — Telephone Encounter (Signed)
(  Key: [id number]) Rx #: [id number] AJHHID 0.'25MG'$ /0.5ML auto-injectors Send today waiting for determination

## 2022-05-25 NOTE — Telephone Encounter (Signed)
PA send today

## 2022-05-25 NOTE — Telephone Encounter (Signed)
Your prior authorization for Savannah George has been approved through 09/24/2022 Patient notified

## 2022-05-25 NOTE — Telephone Encounter (Signed)
Pt states her medication is needing a PA and was just checking to see if we received it.

## 2022-05-27 LAB — SURESWAB CT/NG/T. VAGINALIS
C. trachomatis RNA, TMA: NOT DETECTED
N. gonorrhoeae RNA, TMA: NOT DETECTED
Trichomonas vaginalis RNA: NOT DETECTED

## 2022-05-31 ENCOUNTER — Encounter: Payer: Self-pay | Admitting: Physical Therapy

## 2022-05-31 ENCOUNTER — Ambulatory Visit (INDEPENDENT_AMBULATORY_CARE_PROVIDER_SITE_OTHER): Payer: 59 | Admitting: Physical Therapy

## 2022-05-31 DIAGNOSIS — M6281 Muscle weakness (generalized): Secondary | ICD-10-CM | POA: Diagnosis not present

## 2022-05-31 DIAGNOSIS — M25562 Pain in left knee: Secondary | ICD-10-CM | POA: Diagnosis not present

## 2022-05-31 NOTE — Therapy (Signed)
OUTPATIENT PHYSICAL THERAPY TREATMENT NOTE   Patient Name: Savannah George MRN: [id number] DOB:March 17, 1981, 41 y.o., female Today's Date: 05/31/2022   END OF SESSION:   PT End of Session - 05/31/22 0809     Visit Number 3    Number of Visits 12    Date for PT Re-Evaluation 06/22/22    Authorization Type [employer]    PT Start Time 0808    PT Stop Time 0846    PT Time Calculation (min) 38 min    Activity Tolerance Patient tolerated treatment well    Behavior During Therapy WFL for tasks assessed/performed             Past Medical History:  Diagnosis Date   Abnormal Pap smear of cervix    age 27 or 17   Anemia    Anxiety    Back pain    Depression    DVT (deep venous thrombosis) (HCC)    Fibroma    Fibromyalgia 2019   GERD (gastroesophageal reflux disease)    Hyperlipidemia    IBS (irritable bowel syndrome)    Kidney stones    Migraines    Other fatigue    Pneumonia    Poor sleep    Prediabetes    Shortness of breath on exertion    Sleep apnea    BiPAP   Thyroid disease    Urinary incontinence    Vitamin D deficiency    Past Surgical History:  Procedure Laterality Date   CRYOTHERAPY     For abnormal pap smear, age 29 or 15   INTRAUTERINE DEVICE INSERTION     PARAGUARD   Patient Active Problem List   Diagnosis Date Noted   GERD (gastroesophageal reflux disease) 06/20/2020   Prediabetes 06/20/2020   IBS (irritable bowel syndrome) 03/16/2020   Chronic fatigue 02/04/2019   Fibromyalgia 02/04/2019   OSA, without need for CPAP 02/04/2019   IUD, Mirena, placed 12/25/18 02/04/2019   Urinary incontinence 11/06/2018   Sleep disorder 10/25/2018   Morbid obesity ([place]) 10/25/2018   Depression, major, single episode, moderate ([place]) 10/25/2018   Amenorrhea 10/25/2018   Family history of ovarian cancer 10/25/2018   Pure hypercholesterolemia 10/25/2018   Seasonal allergies 10/25/2018   PCP: Hetty Blend Paker   REFERRING PROVIDER: Dimas Chyle   REFERRING DIAG: L knee pain,  fibromyalgia    THERAPY DIAG:  Acute pain of left knee   Muscle weakness (generalized)   Rationale for Evaluation and Treatment Rehabilitation   ONSET DATE:    SUBJECTIVE:    SUBJECTIVE STATEMENT:    05/31/2022   States difficult day medically. Has not been sleeping well for a few nights, feels a lot of brain fog today. Legs, quads sore.     Eval: Pt states L knee pain, for about 1 month, no injury to report. Notes when L knee is sore, L hip also gets sore.  L Knee: creaking/cracking, Stairs difficult,  Home: just a few stairs to leave house.  Work: Development worker, community, push carts, some lifting. Shelving.  Also has Fibromyalgia which she feels is significantly impacting her mobility. States some good days, some very bad, with poor ability for mobility , brain fog, and increased pain. Has widespread pain, worst in her quads, shoulder blades, she has exercised in pool in the past and liked that.        PERTINENT HISTORY: Fibromyalgia,    PAIN:  Are you having pain? Yes: NPRS scale: 1-5/10 Pain location: L knee Pain description: sore, painful  Aggravating factors: increased activity  Relieving factors: none stated    PRECAUTIONS: None   WEIGHT BEARING RESTRICTIONS No   FALLS:  Has patient fallen in last 6 months? Yes. Number of falls 0     PLOF: Independent   PATIENT GOALS Decreased pain in Knee, improved mobility      OBJECTIVE:      COGNITION:           Overall cognitive status: Within functional limits for tasks assessed                 POSTURE: poor seated posture with rounded shoulders and back.    PALPATION: Tenderness in L gr troch, into ITB,    LOWER EXTREMITY ROM:   Hips: WFL,  Knees: WFL     LOWER EXTREMITY MMT:   Hips: 4-/5, Knee: 4/5      TODAY'S TREATMENT: 05/31/2022 Therapeutic Exercise:  Aerobic: bike level 2, 8 minutes  Supine: TA with education on achieving contraction x 15; Supine march x 15 with TA; Hip abd/clams GTB x 15;   Modified fig 4 piriformis- supine 30 sec x 2 bil;  Prone:  Seated: LAQs x15 B 5" holds,   Standing: Neuromuscular Re-education: Manual Therapy: STM/roller to bil thighs and ITB Self Care: Modalities:       PATIENT EDUCATION:  Education details: initial  HEP, use of TENS unit,  Person educated: Patient Education method: Explanation, Demonstration, Tactile cues, and Verbal cues Education comprehension: verbalized understanding, returned demonstration, verbal cues required, tactile cues required, and needs further education     HOME EXERCISE PROGRAM: Access Code: [email or web address] URL: [email or web address] Date: 06/01/2022 Prepared by: Lyndee Hensen  Exercises - Seated Knee Extension AROM  - 1 x daily - 1-2 sets - 10 reps - Supine Piriformis Stretch Pulling Heel to Hip  - 2 x daily - 3 reps - 30 hold - Supine March  - 1 x daily - 2 sets - 10 reps - Supine Bridge  - 1 x daily - 1 sets - 5-10 reps    ASSESSMENT:   CLINICAL IMPRESSION: 05/31/2022 Pt did will with light ther ex today, initial HEP issued. She was challenged with all activities due to weakness, but able to perform. Trial for STM with roller today for quad/muscle pain, discussed use of this or percussion gun for muscle pain management. Discussed use of Tens unit as well. Plan to progress light mobility as tolerated.    Eval: Pt presents with primary complaint of increased pain in L knee. She also has widespread pain and weakness from fibromyalgia that makes her mobility more difficult. She has soreness in knee, quad, as well as into L gr troch and ITB. She has weakness in quads and LEs. She has decreased ability for functional activity , due to pain and weakness, and variable energy levels/fatigue. Pt with difficulty maintaining exercise program in the past, but did like aquatic exercise. Will discuss aquatic exercise further, as well as other home pain relief ideas, Tens unit and/or percussion gun. Pt to benefit from  skilled PT to improve deficits and pain.      OBJECTIVE IMPAIRMENTS decreased activity tolerance, decreased endurance, decreased knowledge of use of DME, decreased mobility, decreased strength, increased muscle spasms, improper body mechanics, and pain.    ACTIVITY LIMITATIONS meal prep, cleaning, laundry, driving, shopping, community activity, occupation, and yard work.    PERSONAL FACTORS  Fibromyalgia  are also affecting patient's functional outcome.      REHAB POTENTIAL: Good  CLINICAL DECISION MAKING: Stable/uncomplicated   EVALUATION COMPLEXITY: Low     GOALS: Goals reviewed with patient? Yes   SHORT TERM GOALS: Target date: 05/25/22   Pt to be independent with initial HEP   Goal status: INITIAL       LONG TERM GOALS: Target date: 06/22/2022    Pt to be independent  with final HEP   Goal status: INITIAL   2.  Pt to report decreased pain in L knee, to 0-3/10 with activity.    Goal status: INITIAL   3. Pt to voice 1-2 home pain relief strategies for days where she is in more pain from fibromyalgia.    Goal status: INITIAL   4.  Pt to demo improved strength of hips to at least 4/5 to improve pain,  ambulation and stairs.    Goal status: INITIAL   5. Pt to demo ability for bend, lift squat with optimal mechanics for back and knee, without pain greater than 3/10, to improve ability for job duties.            PLAN: PT FREQUENCY: 1-2x/week   PT DURATION: 6 weeks   PLANNED INTERVENTIONS: Therapeutic exercises, Therapeutic activity, Neuromuscular re-education, Balance training, Gait training, Patient/Family education, Joint manipulation, Joint mobilization, Stair training, DME instructions, Aquatic Therapy, Dry Needling, Electrical stimulation, Spinal manipulation, Spinal mobilization, Cryotherapy, Moist heat, Taping, Vasopneumatic device, Traction, Ultrasound, Ionotophoresis '4mg'$ /ml Dexamethasone, and Manual therapy   PLAN FOR NEXT SESSION:  trial IFC. HEP for  light hip, quad strength, low hip stretches, Discuss home Tens unit and/or percussion gun.     Lyndee Hensen, PT, DPT 9:31 AM  06/01/22

## 2022-06-01 ENCOUNTER — Encounter: Payer: 59 | Admitting: Physical Therapy

## 2022-06-01 ENCOUNTER — Encounter: Payer: Self-pay | Admitting: Physical Therapy

## 2022-06-01 ENCOUNTER — Telehealth: Payer: Self-pay | Admitting: *Deleted

## 2022-06-01 NOTE — Telephone Encounter (Signed)
(  Key: [id number]) Rx #: [id number] Ozempic (0.25 or 0.5 MG/DOSE) '2MG'$ /1.5ML pen-injectors Send on 06/01/2022 Waiting for determination

## 2022-06-07 ENCOUNTER — Encounter: Payer: Self-pay | Admitting: Physical Therapy

## 2022-06-07 ENCOUNTER — Ambulatory Visit (INDEPENDENT_AMBULATORY_CARE_PROVIDER_SITE_OTHER): Payer: 59 | Admitting: Physical Therapy

## 2022-06-07 DIAGNOSIS — M25562 Pain in left knee: Secondary | ICD-10-CM | POA: Diagnosis not present

## 2022-06-07 DIAGNOSIS — M6281 Muscle weakness (generalized): Secondary | ICD-10-CM | POA: Diagnosis not present

## 2022-06-07 NOTE — Therapy (Signed)
OUTPATIENT PHYSICAL THERAPY TREATMENT NOTE   Patient Name: Savannah George MRN: [id number] DOB:08-22-1981, 41 y.o., female Today's Date: 06/07/2022   END OF SESSION:   PT End of Session - 06/07/22 0951     Visit Number 4    Number of Visits 12    Date for PT Re-Evaluation 06/22/22    Authorization Type [employer]    PT Start Time 0848    PT Stop Time 0930    PT Time Calculation (min) 42 min    Activity Tolerance Patient tolerated treatment well    Behavior During Therapy WFL for tasks assessed/performed              Past Medical History:  Diagnosis Date   Abnormal Pap smear of cervix    age 71 or 25   Anemia    Anxiety    Back pain    Depression    DVT (deep venous thrombosis) (HCC)    Fibroma    Fibromyalgia 2019   GERD (gastroesophageal reflux disease)    Hyperlipidemia    IBS (irritable bowel syndrome)    Kidney stones    Migraines    Other fatigue    Pneumonia    Poor sleep    Prediabetes    Shortness of breath on exertion    Sleep apnea    BiPAP   Thyroid disease    Urinary incontinence    Vitamin D deficiency    Past Surgical History:  Procedure Laterality Date   CRYOTHERAPY     For abnormal pap smear, age 6 or 70   INTRAUTERINE DEVICE INSERTION     PARAGUARD   Patient Active Problem List   Diagnosis Date Noted   GERD (gastroesophageal reflux disease) 06/20/2020   Prediabetes 06/20/2020   IBS (irritable bowel syndrome) 03/16/2020   Chronic fatigue 02/04/2019   Fibromyalgia 02/04/2019   OSA, without need for CPAP 02/04/2019   IUD, Mirena, placed 12/25/18 02/04/2019   Urinary incontinence 11/06/2018   Sleep disorder 10/25/2018   Morbid obesity ([place]) 10/25/2018   Depression, major, single episode, moderate ([place]) 10/25/2018   Amenorrhea 10/25/2018   Family history of ovarian cancer 10/25/2018   Pure hypercholesterolemia 10/25/2018   Seasonal allergies 10/25/2018   PCP: Hetty Blend Paker   REFERRING PROVIDER: Dimas Chyle   REFERRING DIAG: L knee  pain, fibromyalgia    THERAPY DIAG:  Acute pain of left knee   Muscle weakness (generalized)   Rationale for Evaluation and Treatment Rehabilitation   ONSET DATE:    SUBJECTIVE:    SUBJECTIVE STATEMENT:    6/152023   States a lot of brain fog today.     Eval: Pt states L knee pain, for about 1 month, no injury to report. Notes when L knee is sore, L hip also gets sore.  L Knee: creaking/cracking, Stairs difficult,  Home: just a few stairs to leave house.  Work: Development worker, community, push carts, some lifting. Shelving.  Also has Fibromyalgia which she feels is significantly impacting her mobility. States some good days, some very bad, with poor ability for mobility , brain fog, and increased pain. Has widespread pain, worst in her quads, shoulder blades, she has exercised in pool in the past and liked that.        PERTINENT HISTORY: Fibromyalgia,    PAIN:  Are you having pain? Yes: NPRS scale: 1-5/10 Pain location: L knee Pain description: sore, painful Aggravating factors: increased activity  Relieving factors: none stated    PRECAUTIONS: None  WEIGHT BEARING RESTRICTIONS No   FALLS:  Has patient fallen in last 6 months? Yes. Number of falls 0     PLOF: Independent   PATIENT GOALS Decreased pain in Knee, improved mobility      OBJECTIVE:      COGNITION:           Overall cognitive status: Within functional limits for tasks assessed                 POSTURE: poor seated posture with rounded shoulders and back.    PALPATION: Tenderness in L gr troch, into ITB,    LOWER EXTREMITY ROM:   Hips: WFL,  Knees: WFL     LOWER EXTREMITY MMT:   Hips: 4-/5, Knee: 4/5      TODAY'S TREATMENT:  06/07/22 Therapeutic Exercise:  Aerobic: bike level 2,  7 minutes  Supine: TA with education on achieving contraction x 5 min   Supine march x 15 with TA;   Hip abd/clams GTB with TA x 15;    Prone:  Seated: LAQ s, 2lb x 15 B 3" holds,   Standing:  Neuromuscular  Re-education: Manual Therapy:  Self Care: Modalities: IFC to bil thighs, x10 min, with education on home use.     05/31/2022 Therapeutic Exercise:  Aerobic: bike level 2, 8 minutes  Supine: TA with education on achieving contraction x 15; Supine march x 15 with TA; Hip abd/clams GTB x 15;  Modified fig 4 piriformis- supine 30 sec x 2 bil;  Prone:  Seated: LAQs x15 B 5" holds,   Standing: Neuromuscular Re-education: Manual Therapy: STM/roller to bil thighs and ITB Self Care: Modalities:       PATIENT EDUCATION:  Education details: use of home estim ,  Person educated: Patient Education method: Explanation, Demonstration, Tactile cues, and Verbal cues Education comprehension: verbalized understanding, returned demonstration, verbal cues required, tactile cues required, and needs further education     HOME EXERCISE PROGRAM: Access Code: [email or web address]    ASSESSMENT:   CLINICAL IMPRESSION: 06/07/2022 Pt with good ability for ther ex today. Challenged with ability for TA contraction, will benefit from continued education and practice on achieving contraction and breathing. Plan to progress strengthening as tolerated. Also used IFC for education on estim for pain relief and home use of estim for pain management. Pt ordering TENS unit.     Eval: Pt presents with primary complaint of increased pain in L knee. She also has widespread pain and weakness from fibromyalgia that makes her mobility more difficult. She has soreness in knee, quad, as well as into L gr troch and ITB. She has weakness in quads and LEs. She has decreased ability for functional activity , due to pain and weakness, and variable energy levels/fatigue. Pt with difficulty maintaining exercise program in the past, but did like aquatic exercise. Will discuss aquatic exercise further, as well as other home pain relief ideas, Tens unit and/or percussion gun. Pt to benefit from skilled PT to improve deficits and pain.       OBJECTIVE IMPAIRMENTS decreased activity tolerance, decreased endurance, decreased knowledge of use of DME, decreased mobility, decreased strength, increased muscle spasms, improper body mechanics, and pain.    ACTIVITY LIMITATIONS meal prep, cleaning, laundry, driving, shopping, community activity, occupation, and yard work.    PERSONAL FACTORS  Fibromyalgia  are also affecting patient's functional outcome.      REHAB POTENTIAL: Good   CLINICAL DECISION MAKING: Stable/uncomplicated   EVALUATION COMPLEXITY: Low  GOALS: Goals reviewed with patient? Yes   SHORT TERM GOALS: Target date: 05/25/22   Pt to be independent with initial HEP   Goal status: INITIAL       LONG TERM GOALS: Target date: 06/22/2022    Pt to be independent  with final HEP   Goal status: INITIAL   2.  Pt to report decreased pain in L knee, to 0-3/10 with activity.    Goal status: INITIAL   3. Pt to voice 1-2 home pain relief strategies for days where she is in more pain from fibromyalgia.    Goal status: INITIAL   4.  Pt to demo improved strength of hips to at least 4/5 to improve pain,  ambulation and stairs.    Goal status: INITIAL   5. Pt to demo ability for bend, lift squat with optimal mechanics for back and knee, without pain greater than 3/10, to improve ability for job duties.            PLAN: PT FREQUENCY: 1-2x/week   PT DURATION: 6 weeks   PLANNED INTERVENTIONS: Therapeutic exercises, Therapeutic activity, Neuromuscular re-education, Balance training, Gait training, Patient/Family education, Joint manipulation, Joint mobilization, Stair training, DME instructions, Aquatic Therapy, Dry Needling, Electrical stimulation, Spinal manipulation, Spinal mobilization, Cryotherapy, Moist heat, Taping, Vasopneumatic device, Traction, Ultrasound, Ionotophoresis '4mg'$ /ml Dexamethasone, and Manual therapy   PLAN FOR NEXT SESSION:  trial IFC. HEP for light hip, quad strength, low hip stretches,  Discuss home Tens unit and/or percussion gun.     Lyndee Hensen, PT, DPT 9:55 AM  06/07/22

## 2022-06-11 ENCOUNTER — Encounter: Payer: Self-pay | Admitting: Physical Therapy

## 2022-06-11 ENCOUNTER — Ambulatory Visit (INDEPENDENT_AMBULATORY_CARE_PROVIDER_SITE_OTHER): Payer: 59 | Admitting: Physical Therapy

## 2022-06-11 DIAGNOSIS — M6281 Muscle weakness (generalized): Secondary | ICD-10-CM | POA: Diagnosis not present

## 2022-06-11 DIAGNOSIS — M25562 Pain in left knee: Secondary | ICD-10-CM | POA: Diagnosis not present

## 2022-06-11 NOTE — Therapy (Signed)
OUTPATIENT PHYSICAL THERAPY TREATMENT NOTE   Patient Name: Savannah George MRN: [id number] DOB:1981/03/24, 41 y.o., female Today's Date: 06/11/2022   END OF SESSION:   PT End of Session - 06/11/22 0804     Visit Number 5    Number of Visits 12    Date for PT Re-Evaluation 06/22/22    Authorization Type U[employer]    PT Start Time 0805    PT Stop Time 0845    PT Time Calculation (min) 40 min    Activity Tolerance Patient tolerated treatment well    Behavior During Therapy WFL for tasks assessed/performed              Past Medical History:  Diagnosis Date   Abnormal Pap smear of cervix    age 83 or 59   Anemia    Anxiety    Back pain    Depression    DVT (deep venous thrombosis) (HCC)    Fibroma    Fibromyalgia 2019   GERD (gastroesophageal reflux disease)    Hyperlipidemia    IBS (irritable bowel syndrome)    Kidney stones    Migraines    Other fatigue    Pneumonia    Poor sleep    Prediabetes    Shortness of breath on exertion    Sleep apnea    BiPAP   Thyroid disease    Urinary incontinence    Vitamin D deficiency    Past Surgical History:  Procedure Laterality Date   CRYOTHERAPY     For abnormal pap smear, age 85 or 17   INTRAUTERINE DEVICE INSERTION     PARAGUARD   Patient Active Problem List   Diagnosis Date Noted   GERD (gastroesophageal reflux disease) 06/20/2020   Prediabetes 06/20/2020   IBS (irritable bowel syndrome) 03/16/2020   Chronic fatigue 02/04/2019   Fibromyalgia 02/04/2019   OSA, without need for CPAP 02/04/2019   IUD, Mirena, placed 12/25/18 02/04/2019   Urinary incontinence 11/06/2018   Sleep disorder 10/25/2018   Morbid obesity ([place]) 10/25/2018   Depression, major, single episode, moderate ([place]) 10/25/2018   Amenorrhea 10/25/2018   Family history of ovarian cancer 10/25/2018   Pure hypercholesterolemia 10/25/2018   Seasonal allergies 10/25/2018   PCP: Hetty Blend Paker   REFERRING PROVIDER: Dimas Chyle   REFERRING DIAG: L knee  pain, fibromyalgia    THERAPY DIAG:  Acute pain of left knee   Muscle weakness (generalized)   Rationale for Evaluation and Treatment Rehabilitation   ONSET DATE:    SUBJECTIVE:    SUBJECTIVE STATEMENT:    06/11/22: Pt states having a good day today. She has little pain in knee, and less fibro/brain fog symptoms. Did buy a percussion gun and TENS unit, brought both with her today for education.     Eval: Pt states L knee pain, for about 1 month, no injury to report. Notes when L knee is sore, L hip also gets sore.  L Knee: creaking/cracking, Stairs difficult,  Home: just a few stairs to leave house.  Work: Development worker, community, push carts, some lifting. Shelving.  Also has Fibromyalgia which she feels is significantly impacting her mobility. States some good days, some very bad, with poor ability for mobility , brain fog, and increased pain. Has widespread pain, worst in her quads, shoulder blades, she has exercised in pool in the past and liked that.        PERTINENT HISTORY: Fibromyalgia,    PAIN:  Are you having pain? Yes: NPRS scale:  1-5/10 Pain location: L knee Pain description: sore, painful Aggravating factors: increased activity  Relieving factors: none stated    PRECAUTIONS: None   WEIGHT BEARING RESTRICTIONS No   FALLS:  Has patient fallen in last 6 months? Yes. Number of falls 0     PLOF: Independent   PATIENT GOALS Decreased pain in Knee, improved mobility      OBJECTIVE:      COGNITION:           Overall cognitive status: Within functional limits for tasks assessed                 POSTURE: poor seated posture with rounded shoulders and back.    PALPATION: Tenderness in L gr troch, into ITB,    LOWER EXTREMITY ROM:   Hips: WFL,  Knees: WFL     LOWER EXTREMITY MMT:   Hips: 4-/5, Knee: 4/5      TODAY'S TREATMENT:  06/11/22: Therapeutic Exercise:  Aerobic: bike level 2,  8 minutes  Supine: TA with education on achieving contraction x 2  min;  Supine March x 15;  Prone:  Seated: LAQ s, 2lb x 15 B 3" holds,  Sit to stand x 10;   Standing: Step up 6 in x 10 bil, no UE support; Hip abd 2x10 bil;  Neuromuscular Re-education: Manual Therapy:  Self Care: Modalities: TENS to bil thighs, x 6 min, with education on home use. Education/tiral  on home use of percussion gun, to L thigh/quad x4 min     06/07/22 Therapeutic Exercise:  Aerobic: bike level 2,  7 minutes  Supine: TA with education on achieving contraction x 5 min   Supine march x 15 with TA;   Hip abd/clams GTB with TA x 15;    Prone:  Seated: LAQ s, 2lb x 15 B 3" holds,   Standing:  Neuromuscular Re-education: Manual Therapy:  Self Care: Modalities: IFC to bil thighs, x10 min, with education on home use.     05/31/2022 Therapeutic Exercise:  Aerobic: bike level 2, 8 minutes  Supine: TA with education on achieving contraction x 15; Supine march x 15 with TA; Hip abd/clams GTB x 15;  Modified fig 4 piriformis- supine 30 sec x 2 bil;  Prone:  Seated: LAQs x15 B 5" holds,   Standing: Neuromuscular Re-education: Manual Therapy: STM/roller to bil thighs and ITB Self Care: Modalities:       PATIENT EDUCATION:  Education details: use of home estim ,  Person educated: Patient Education method: Explanation, Demonstration, Tactile cues, and Verbal cues Education comprehension: verbalized understanding, returned demonstration, verbal cues required, tactile cues required, and needs further education     HOME EXERCISE PROGRAM: Access Code: [email or web address]    ASSESSMENT:   CLINICAL IMPRESSION: 06/11/2022 Pt with good ability for ther ex today. Challenged with ability for TA contraction, but some improvement from last visit- will benefit from continued education and practice on achieving contraction and breathing. Plan to progress strengthening and pain relief for L knee as tolerated. Education on home use of TENS and percussion gun for pain relief and management. Pt  with good understanding.     Eval: Pt presents with primary complaint of increased pain in L knee. She also has widespread pain and weakness from fibromyalgia that makes her mobility more difficult. She has soreness in knee, quad, as well as into L gr troch and ITB. She has weakness in quads and LEs. She has decreased ability for functional activity , due to  pain and weakness, and variable energy levels/fatigue. Pt with difficulty maintaining exercise program in the past, but did like aquatic exercise. Will discuss aquatic exercise further, as well as other home pain relief ideas, Tens unit and/or percussion gun. Pt to benefit from skilled PT to improve deficits and pain.      OBJECTIVE IMPAIRMENTS decreased activity tolerance, decreased endurance, decreased knowledge of use of DME, decreased mobility, decreased strength, increased muscle spasms, improper body mechanics, and pain.    ACTIVITY LIMITATIONS meal prep, cleaning, laundry, driving, shopping, community activity, occupation, and yard work.    PERSONAL FACTORS  Fibromyalgia  are also affecting patient's functional outcome.      REHAB POTENTIAL: Good   CLINICAL DECISION MAKING: Stable/uncomplicated   EVALUATION COMPLEXITY: Low     GOALS: Goals reviewed with patient? Yes   SHORT TERM GOALS: Target date: 05/25/22   Pt to be independent with initial HEP   Goal status: INITIAL       LONG TERM GOALS: Target date: 06/22/2022    Pt to be independent  with final HEP   Goal status: INITIAL   2.  Pt to report decreased pain in L knee, to 0-3/10 with activity.    Goal status: INITIAL   3. Pt to voice 1-2 home pain relief strategies for days where she is in more pain from fibromyalgia.    Goal status: INITIAL   4.  Pt to demo improved strength of hips to at least 4/5 to improve pain,  ambulation and stairs.    Goal status: INITIAL   5. Pt to demo ability for bend, lift squat with optimal mechanics for back and knee, without  pain greater than 3/10, to improve ability for job duties.            PLAN: PT FREQUENCY: 1-2x/week   PT DURATION: 6 weeks   PLANNED INTERVENTIONS: Therapeutic exercises, Therapeutic activity, Neuromuscular re-education, Balance training, Gait training, Patient/Family education, Joint manipulation, Joint mobilization, Stair training, DME instructions, Aquatic Therapy, Dry Needling, Electrical stimulation, Spinal manipulation, Spinal mobilization, Cryotherapy, Moist heat, Taping, Vasopneumatic device, Traction, Ultrasound, Ionotophoresis '4mg'$ /ml Dexamethasone, and Manual therapy   PLAN FOR NEXT SESSION:  progressive strength and pain relief for L knee as tolerated, continued education on chronic pain management.    Lyndee Hensen, PT, DPT 8:37 AM  06/12/22

## 2022-06-14 ENCOUNTER — Encounter: Payer: Self-pay | Admitting: Physical Therapy

## 2022-06-14 ENCOUNTER — Telehealth: Payer: Self-pay | Admitting: Family Medicine

## 2022-06-14 ENCOUNTER — Encounter: Payer: 59 | Admitting: Physical Therapy

## 2022-06-14 ENCOUNTER — Ambulatory Visit (INDEPENDENT_AMBULATORY_CARE_PROVIDER_SITE_OTHER): Payer: 59 | Admitting: Physical Therapy

## 2022-06-14 DIAGNOSIS — M25562 Pain in left knee: Secondary | ICD-10-CM | POA: Diagnosis not present

## 2022-06-14 DIAGNOSIS — M6281 Muscle weakness (generalized): Secondary | ICD-10-CM

## 2022-06-14 MED ORDER — ONDANSETRON 8 MG PO TBDP: 8.0000 mg | ORAL_TABLET | Freq: Three times a day (TID) | ORAL | 0 refills | Status: DC | PRN

## 2022-06-14 NOTE — Telephone Encounter (Signed)
Left detailed message on personal voicemail Rx for Zofran disintegrating tablet was sent to pharmacy as requested. Any questions please call office.

## 2022-06-14 NOTE — Telephone Encounter (Signed)
..   Encourage patient to contact the pharmacy for refills or they can request refills through [place]:  05/10/22  NEXT APPOINTMENT DATE: 06/21/22 (PT)  MEDICATION: ondansetron (ZOFRAN) 8 MG tablet  Is the patient out of medication? Yes, she would like the dissolvable tablets if possible  [id number] [id number] Phone:  [phone] Fax:  [phone] DEA #:  [id number] Let patient know to contact pharmacy at the end of the day to make sure medication is ready.  Please notify patient to allow 48-72 hours to process

## 2022-06-14 NOTE — Therapy (Signed)
OUTPATIENT PHYSICAL THERAPY TREATMENT NOTE   Patient Name: Savannah George MRN: [id number] DOB:June 27, 1981, 41 y.o., female Today's Date: 06/14/2022   END OF SESSION:   PT End of Session - 06/14/22 1341     Visit Number 6    Number of Visits 12    Date for PT Re-Evaluation 06/22/22    Authorization Type UHC    PT Start Time 1344    PT Stop Time 1425    PT Time Calculation (min) 41 min    Activity Tolerance Patient tolerated treatment well    Behavior During Therapy WFL for tasks assessed/performed               Past Medical History:  Diagnosis Date   Abnormal Pap smear of cervix    age 40 or 66   Anemia    Anxiety    Back pain    Depression    DVT (deep venous thrombosis) (HCC)    Fibroma    Fibromyalgia 2019   GERD (gastroesophageal reflux disease)    Hyperlipidemia    IBS (irritable bowel syndrome)    Kidney stones    Migraines    Other fatigue    Pneumonia    Poor sleep    Prediabetes    Shortness of breath on exertion    Sleep apnea    BiPAP   Thyroid disease    Urinary incontinence    Vitamin D deficiency    Past Surgical History:  Procedure Laterality Date   CRYOTHERAPY     For abnormal pap smear, age 26 or 70   INTRAUTERINE DEVICE INSERTION     PARAGUARD   Patient Active Problem List   Diagnosis Date Noted   GERD (gastroesophageal reflux disease) 06/20/2020   Prediabetes 06/20/2020   IBS (irritable bowel syndrome) 03/16/2020   Chronic fatigue 02/04/2019   Fibromyalgia 02/04/2019   OSA, without need for CPAP 02/04/2019   IUD, Mirena, placed 12/25/18 02/04/2019   Urinary incontinence 11/06/2018   Sleep disorder 10/25/2018   Morbid obesity ([place]) 10/25/2018   Depression, major, single episode, moderate ([place]) 10/25/2018   Amenorrhea 10/25/2018   Family history of ovarian cancer 10/25/2018   Pure hypercholesterolemia 10/25/2018   Seasonal allergies 10/25/2018   PCP: Hetty Blend Paker   REFERRING PROVIDER: Dimas Chyle   REFERRING DIAG: L knee  pain, fibromyalgia    THERAPY DIAG:  Acute pain of left knee   Muscle weakness (generalized)   Rationale for Evaluation and Treatment Rehabilitation   ONSET DATE:    SUBJECTIVE:    SUBJECTIVE STATEMENT:    06/14/22: Pt states "having a good day" but still has L knee pain, and painful place in lateral quad. Has been using TENS unit.      Eval: Pt states L knee pain, for about 1 month, no injury to report. Notes when L knee is sore, L hip also gets sore.  L Knee: creaking/cracking, Stairs difficult,  Home: just a few stairs to leave house.  Work: Development worker, community, push carts, some lifting. Shelving.  Also has Fibromyalgia which she feels is significantly impacting her mobility. States some good days, some very bad, with poor ability for mobility , brain fog, and increased pain. Has widespread pain, worst in her quads, shoulder blades, she has exercised in pool in the past and liked that.        PERTINENT HISTORY: Fibromyalgia,    PAIN:  Are you having pain? Yes: NPRS scale: 1-5/10 Pain location: L knee Pain description: sore,  painful Aggravating factors: increased activity  Relieving factors: none stated    PRECAUTIONS: None   WEIGHT BEARING RESTRICTIONS No   FALLS:  Has patient fallen in last 6 months? Yes. Number of falls 0     PLOF: Independent   PATIENT GOALS Decreased pain in Knee, improved mobility      OBJECTIVE:      COGNITION:           Overall cognitive status: Within functional limits for tasks assessed                 POSTURE: poor seated posture with rounded shoulders and back.    PALPATION: Tenderness in L gr troch, into ITB,    LOWER EXTREMITY ROM:   Hips: WFL,  Knees: WFL     LOWER EXTREMITY MMT:   Hips: 4-/5, Knee: 4/5      TODAY'S TREATMENT:  06/14/22: Therapeutic Exercise:  Aerobic: bike level 2,  8 minutes  Supine: SLR 3reps x 2 each bil with TA cuing.  Prone:   Seated: Sit to stand x 10;   Standing: Step up 6 in x 10  bil, no UE support; Hip abd 2x10 bil; marching x 20 with focus on trunk and hip control;  Stretches: supine hip flexor stretch on L, off side of table x 2 min;  Neuromuscular Re-education: Manual Therapy: STM to lateral quad Self Care: Modalities:       06/11/22: Therapeutic Exercise:  Aerobic: bike level 2,  8 minutes  Supine: TA with education on achieving contraction x 2 min;  Supine March x 15;  Prone:  Seated: LAQ s, 2lb x 15 B 3" holds,  Sit to stand x 10;   Standing: Step up 6 in x 10 bil, no UE support; Hip abd 2x10 bil;  Neuromuscular Re-education: Manual Therapy:  Self Care: Modalities: TENS to bil thighs, x 6 min, with education on home use. Education/tiral  on home use of percussion gun, to L thigh/quad x4 min     06/07/22 Therapeutic Exercise:  Aerobic: bike level 2,  7 minutes  Supine: TA with education on achieving contraction x 5 min   Supine march x 15 with TA;   Hip abd/clams GTB with TA x 15;    Prone:  Seated: LAQ s, 2lb x 15 B 3" holds,   Standing:  Neuromuscular Re-education: Manual Therapy:  Self Care: Modalities: IFC to bil thighs, x10 min, with education on home use.       PATIENT EDUCATION:  Education details: reviewed HEP  Person educated: Patient Education method: Explanation, Demonstration, Tactile cues, and Verbal cues Education comprehension: verbalized understanding, returned demonstration, verbal cues required, tactile cues required, and needs further education     HOME EXERCISE PROGRAM: Access Code: [email or web address]    ASSESSMENT:   CLINICAL IMPRESSION: 06/14/2022 Pt with improved tolerance for STM to lateral quad today. Discussed trying to continue to desensitize area/ lateral quad, with continued STM at home. She feels soreness in quad today with most activities. She is improving with ability and tolerance for ther ex, but very challenged with activities due to weakness. SLR very difficult today. Will continue light strengthening  progression as tolerated.     Eval: Pt presents with primary complaint of increased pain in L knee. She also has widespread pain and weakness from fibromyalgia that makes her mobility more difficult. She has soreness in knee, quad, as well as into L gr troch and ITB. She has weakness in quads and LEs.  She has decreased ability for functional activity , due to pain and weakness, and variable energy levels/fatigue. Pt with difficulty maintaining exercise program in the past, but did like aquatic exercise. Will discuss aquatic exercise further, as well as other home pain relief ideas, Tens unit and/or percussion gun. Pt to benefit from skilled PT to improve deficits and pain.      OBJECTIVE IMPAIRMENTS decreased activity tolerance, decreased endurance, decreased knowledge of use of DME, decreased mobility, decreased strength, increased muscle spasms, improper body mechanics, and pain.    ACTIVITY LIMITATIONS meal prep, cleaning, laundry, driving, shopping, community activity, occupation, and yard work.    PERSONAL FACTORS  Fibromyalgia  are also affecting patient's functional outcome.      REHAB POTENTIAL: Good   CLINICAL DECISION MAKING: Stable/uncomplicated   EVALUATION COMPLEXITY: Low     GOALS: Goals reviewed with patient? Yes   SHORT TERM GOALS: Target date: 05/25/22   Pt to be independent with initial HEP   Goal status: INITIAL       LONG TERM GOALS: Target date: 06/22/2022    Pt to be independent  with final HEP   Goal status: INITIAL   2.  Pt to report decreased pain in L knee, to 0-3/10 with activity.    Goal status: INITIAL   3. Pt to voice 1-2 home pain relief strategies for days where she is in more pain from fibromyalgia.    Goal status: INITIAL   4.  Pt to demo improved strength of hips to at least 4/5 to improve pain,  ambulation and stairs.    Goal status: INITIAL   5. Pt to demo ability for bend, lift squat with optimal mechanics for back and knee, without  pain greater than 3/10, to improve ability for job duties.            PLAN: PT FREQUENCY: 1-2x/week   PT DURATION: 6 weeks   PLANNED INTERVENTIONS: Therapeutic exercises, Therapeutic activity, Neuromuscular re-education, Balance training, Gait training, Patient/Family education, Joint manipulation, Joint mobilization, Stair training, DME instructions, Aquatic Therapy, Dry Needling, Electrical stimulation, Spinal manipulation, Spinal mobilization, Cryotherapy, Moist heat, Taping, Vasopneumatic device, Traction, Ultrasound, Ionotophoresis '4mg'$ /ml Dexamethasone, and Manual therapy   PLAN FOR NEXT SESSION:  progressive strength and pain relief for L knee as tolerated, continued education on chronic pain management.    Lyndee Hensen, PT, DPT 1:42 PM  06/14/22

## 2022-06-14 NOTE — Telephone Encounter (Signed)
Spoke to pt told her [facility] [place] [facility], Mancel [facility]ons has been approved since 6/2 but none of the pharmacies have it in stock due to [place] shortage. Told her [place] Ar said she will order you one from the warehouse and will notify you when it comes in. Pt verbalized understanding.

## 2022-06-14 NOTE — Telephone Encounter (Signed)
FYI Patient arrived in person following her physical therapy visit and wanted an update in regards to her prior authorization for [facility]. States she was informed that she was approved but her pharmacy has told her they haven't received any PA paperwork. Due to this she is not able to receive the medication even when it is in stock. Her pharmacy's information is below. Patient is worried that if this takes any longer she will not get the full supply of [place]gov[place] that her PA allows. She would like an update when available and can be reached at [phone].    [id number] [address] [address] Phone:  [phone] Fax:  [phone] DEA #:  [id number]

## 2022-06-21 ENCOUNTER — Encounter: Payer: Self-pay | Admitting: Physical Therapy

## 2022-06-21 ENCOUNTER — Ambulatory Visit: Payer: 59 | Admitting: Family

## 2022-06-21 ENCOUNTER — Encounter: Payer: Self-pay | Admitting: Family

## 2022-06-21 ENCOUNTER — Ambulatory Visit (INDEPENDENT_AMBULATORY_CARE_PROVIDER_SITE_OTHER): Payer: 59 | Admitting: Physical Therapy

## 2022-06-21 VITALS — BP 139/78 | HR 82 | Temp 98.0°F | Ht 61.0 in | Wt 203.2 lb

## 2022-06-21 DIAGNOSIS — M6281 Muscle weakness (generalized): Secondary | ICD-10-CM

## 2022-06-21 DIAGNOSIS — F321 Major depressive disorder, single episode, moderate: Secondary | ICD-10-CM | POA: Diagnosis not present

## 2022-06-21 DIAGNOSIS — M797 Fibromyalgia: Secondary | ICD-10-CM

## 2022-06-21 DIAGNOSIS — M25562 Pain in left knee: Secondary | ICD-10-CM

## 2022-06-21 NOTE — Progress Notes (Signed)
Subjective:     Patient ID: Savannah George, female    DOB: [birth year], 41 y.o.   MRN: [id number]  Chief Complaint  Patient presents with   Follow-up    Fibromyalgia, Pt states she is having new symptoms such as headaches, confusion and memory trouble. Pt states on 6/24 is when the sharp headaches started, on left side of front of head. Headache Occurred again yesterday.  Pt also having some light sensitivity and noise sensitivity. Has tried Excedrin which did help yesterday.  Referral for psychiatrist.     HPI: Anxiety/Depression: Patient complains of anxiety disorder and Lexapro .   She has the following symptoms: difficulty concentrating, dizziness, fatigue, insomnia, irritable, palpitations, psychomotor agitation, racing thoughts, sweating, depressed/sad mood .  Onset of symptoms was approximately 4 years ago, She denies current suicidal and homicidal ideation.  Possible organic causes contributing are: none.  Risk factors: previous episode of depression  Previous treatment includes many different meds and individual therapy.  She complains of the following side effects from the treatment:  various side effects, not tolerable .     04/12/2022    2:39 PM  Depression screen PHQ 2/9  Decreased Interest 1  Down, Depressed, Hopeless 2  PHQ - 2 Score 3  Altered sleeping 1  Tired, decreased energy 2  Change in appetite 1  Feeling bad or failure about yourself  2  Trouble concentrating 1  Moving slowly or fidgety/restless 1  Suicidal thoughts 1  PHQ-9 Score 12  Difficult doing work/chores Very difficult  Fibromyalgia:  latest episode started as a migraine during the night, and set off a cascade of symptoms including pain all over, emotional instability, brain fogginess. Had trouble at work and could not understand what simple images were, could not process, has talked with PCP about sx and both feel it is fibromyalgia related as she has short term episodes and then is fine. 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 pt took OTC headache which helped and then next day took Zanaflex to help her body pain, but causes extreme fatigue for 1-2 days, states she is highly sensitive to most medicines.  Assessment & Plan:   Problem List Items Addressed This Visit       Other   Morbid obesity ([place])   Relevant Orders   Food Allergy Profile   Glia (IgA/G) + tTG IgA   Depression, major, single episode, moderate ([place])    pt tearful today, waiting on her psychiatry appointment, having "fugue" episodes which are very stressful and affecting her job performance. pt given [facility] # to call as appears they have reached out to her unsucessfully. Offered emotional support, pt wants to hold off on starting meds until seen as she has a few already.       Fibromyalgia - Primary    pt very tearful today, still waiting on Rheumatology referral, looks like rejected by first office, waiting on 2nd to respond. Seen by Neurology and had cognitive testing & MRI all normal, but she was not having her "fugue" symptoms at that time where she completely forgets how to do simple things, can't focus. reports latest episode started as migraine and set off cascade of physical & mental sx. pt also waiting on [facility] referral, pt given [facility] phone # to call and schedule.       Outpatient Medications Prior to Visit  Medication Sig Dispense Refill   Acetaminophen (PAIN RELIEF PO) Take 1  tablet by mouth daily.     levonorgestrel (MIRENA) 20 MCG/24HR IUD 1 each by Intrauterine route once.     Multiple Vitamins-Minerals (MULTIVITAMIN PO) Take 1 tablet by mouth daily.      naproxen sodium (ALEVE) 220 MG tablet Take 220 mg by mouth daily as needed.     ondansetron (ZOFRAN-ODT) 8 MG disintegrating tablet Take 1 tablet (8 mg total) by mouth every 8 (eight) hours as needed for nausea or vomiting. 20 tablet 0   pantoprazole (PROTONIX) 40 MG tablet Take 1 tablet (40 mg total)  by mouth 2 (two) times daily before a meal. **PLEASE CONTACT OFFICE TO SCHEDULE FOLLOW UP 60 tablet 5   Probiotic Product (PROBIOTIC PEARLS WOMENS PO) Take 1 each by mouth daily.     Semaglutide-Weight Management (WEGOVY) 0.25 MG/0.5ML SOAJ Inject 0.25 mg into the skin once a week. For 4 weeks 2 mL 0   tiZANidine (ZANAFLEX) 4 MG tablet TAKE 1 TABLET BY MOUTH EVERY 6 HOURS AS NEEDED FOR MUSCLE SPASMS. 90 tablet 1   No facility-administered medications prior to visit.    Past Medical History:  Diagnosis Date   Abnormal Pap smear of cervix    age 16 or 46   Anemia    Anxiety    Back pain    Depression    DVT (deep venous thrombosis) (HCC)    Fibroma    Fibromyalgia 2019   GERD (gastroesophageal reflux disease)    Hyperlipidemia    IBS (irritable bowel syndrome)    Kidney stones    Migraines    Other fatigue    Pneumonia    Poor sleep    Prediabetes    Shortness of breath on exertion    Sleep apnea    BiPAP   Thyroid disease    Urinary incontinence    Vitamin D deficiency     Past Surgical History:  Procedure Laterality Date   CRYOTHERAPY     For abnormal pap smear, age 8 or 12   INTRAUTERINE DEVICE INSERTION     PARAGUARD    Allergies  Allergen Reactions   Aspirin Hives   Latex Hives       Objective:    Physical Exam Vitals and nursing note reviewed.  Constitutional:      Appearance: Normal appearance.  Cardiovascular:     Rate and Rhythm: Normal rate and regular rhythm.  Pulmonary:     Effort: Pulmonary effort is normal.     Breath sounds: Normal breath sounds.  Musculoskeletal:        General: Normal range of motion.  Skin:    General: Skin is warm and dry.  Neurological:     Mental Status: She is alert.  Psychiatric:        Mood and Affect: Mood normal.        Behavior: Behavior normal.     BP 139/78 (BP Location: Left Arm, Patient Position: Sitting, Cuff Size: Large)   Pulse 82   Temp 98 F (36.7 C) (Temporal)   Ht '5\' 1"'$  (1.549 m)   Wt  203 lb 4 oz (92.2 kg)   LMP  (LMP Unknown)   SpO2 99%   BMI 38.40 kg/m  Wt Readings from Last 3 Encounters:  06/21/22 203 lb 4 oz (92.2 kg)  05/24/22 201 lb (91.2 kg)  05/10/22 199 lb 6.4 oz (90.4 kg)   *Extra time (31mn) spent with patient today which consisted of chart review, long discussion of diagnoses, answering questions, providing  emotional support, and documentation.  Jeanie Sewer, NP

## 2022-06-21 NOTE — Therapy (Signed)
OUTPATIENT PHYSICAL THERAPY TREATMENT NOTE/Re-Cert   Patient Name: Savannah George MRN: [id number] DOB:[birth year], 41 y.o., female Today's Date: 06/21/2022   END OF SESSION:   PT End of Session - 06/21/22 0814     Visit Number 7    Number of Visits 20    Date for PT Re-Evaluation 08/02/22    Authorization Type [employer]    PT Start Time 0804    PT Stop Time 0845    PT Time Calculation (min) 41 min    Activity Tolerance Patient tolerated treatment well    Behavior During Therapy WFL for tasks assessed/performed                Past Medical History:  Diagnosis Date   Abnormal Pap smear of cervix    age 10 or 31   Anemia    Anxiety    Back pain    Depression    DVT (deep venous thrombosis) ([place])    Fibroma    Fibromyalgia 2019   GERD (gastroesophageal reflux disease)    Hyperlipidemia    IBS (irritable bowel syndrome)    Kidney stones    Migraines    Other fatigue    Pneumonia    Poor sleep    Prediabetes    Shortness of breath on exertion    Sleep apnea    BiPAP   Thyroid disease    Urinary incontinence    Vitamin D deficiency    Past Surgical History:  Procedure Laterality Date   CRYOTHERAPY     For abnormal pap smear, age 89 or 40   INTRAUTERINE DEVICE INSERTION     PARAGUARD   Patient Active Problem List   Diagnosis Date Noted   GERD (gastroesophageal reflux disease) 06/20/2020   Prediabetes 06/20/2020   IBS (irritable bowel syndrome) 03/16/2020   Chronic fatigue 02/04/2019   Fibromyalgia 02/04/2019   OSA, without need for CPAP 02/04/2019   IUD, Mirena, placed 12/25/18 02/04/2019   Urinary incontinence 11/06/2018   Sleep disorder 10/25/2018   Morbid obesity ([place]) 10/25/2018   Depression, major, single episode, moderate ([place]) 10/25/2018   Amenorrhea 10/25/2018   Family history of ovarian cancer 10/25/2018   Pure hypercholesterolemia 10/25/2018   Seasonal allergies 10/25/2018   PCP: Hetty Blend Paker   REFERRING PROVIDER: Dimas Chyle   REFERRING  DIAG: L knee pain, fibromyalgia    THERAPY DIAG:  Acute pain of left knee   Muscle weakness (generalized)   Rationale for Evaluation and Treatment Rehabilitation   ONSET DATE:    SUBJECTIVE:    SUBJECTIVE STATEMENT:  06/21/22: Pt had a bad weekend, had to call into work for 2 days, had migraine, and had to take pain meds, states poor sleep even after taking meds. Feeling better today. States when she has "fibro flare up" she has to stay in bed and muscles "lock up". Knee still sore.      Eval: Pt states L knee pain, for about 1 month, no injury to report. Notes when L knee is sore, L hip also gets sore.  L Knee: creaking/cracking, Stairs difficult,  Home: just a few stairs to leave house.  Work: Development worker, community, push carts, some lifting. Shelving.  Also has Fibromyalgia which she feels is significantly impacting her mobility. States some good days, some very bad, with poor ability for mobility , brain fog, and increased pain. Has widespread pain, worst in her quads, shoulder blades, she has exercised in pool in the past and liked that.  PERTINENT HISTORY: Fibromyalgia,    PAIN:  Are you having pain? Yes: NPRS scale: 1-5/10 Pain location: L knee Pain description: sore, painful Aggravating factors: increased activity  Relieving factors: none stated    PRECAUTIONS: None   WEIGHT BEARING RESTRICTIONS No   FALLS:  Has patient fallen in last 6 months? Yes. Number of falls 0     PLOF: Independent   PATIENT GOALS Decreased pain in Knee, improved mobility      OBJECTIVE:  updated 06/21/22      COGNITION:           Overall cognitive status: Within functional limits for tasks assessed                 POSTURE: poor seated posture with rounded shoulders and back.    PALPATION: Tenderness in bil quad, and L UT, Medial L knee    LOWER EXTREMITY ROM:   Hips: WFL,  Knees: WFL     LOWER EXTREMITY MMT:   Hips: 4-/5, Knee: 4/5      TODAY'S  TREATMENT:  06/21/22: Therapeutic Exercise:  Aerobic: bike level 2,  8 minutes  Supine:   Prone:   Seated:   Standing: hip abd 2x10 bil; marching x 20 with focus on trunk and hip control;  Bwd shoulder rolls x 15;  Low scap squeeze x 15;  Stretches:  Neuromuscular Re-education: Manual Therapy:  Self Care: discussed placement of TENS for pain relief, discussed positives to continuing exercise, as well as referral to behavior health.  Modalities:      06/14/22: Therapeutic Exercise:  Aerobic: bike level 2,  8 minutes  Supine: SLR 3reps x 2 each bil with TA cuing.  Prone:   Seated: Sit to stand x 10;   Standing: Step up 6 in x 10 bil, no UE support; Hip abd 2x10 bil; marching x 20 with focus on trunk and hip control;  Stretches: supine hip flexor stretch on L, off side of table x 2 min;  Neuromuscular Re-education: Manual Therapy: STM to lateral quad Self Care: Modalities:       06/11/22: Therapeutic Exercise:  Aerobic: bike level 2,  8 minutes  Supine: TA with education on achieving contraction x 2 min;  Supine March x 15;  Prone:  Seated: LAQ s, 2lb x 15 B 3" holds,  Sit to stand x 10;   Standing: Step up 6 in x 10 bil, no UE support; Hip abd 2x10 bil;  Neuromuscular Re-education: Manual Therapy:  Self Care: Modalities: TENS to bil thighs, x 6 min, with education on home use. Education/tiral  on home use of percussion gun, to L thigh/quad x4 min      PATIENT EDUCATION:  Education details: reviewed HEP  Person educated: Patient Education method: Explanation, Demonstration, Tactile cues, and Verbal cues Education comprehension: verbalized understanding, returned demonstration, verbal cues required, tactile cues required, and needs further education     HOME EXERCISE PROGRAM: Access Code: [email or web address]    ASSESSMENT:   CLINICAL IMPRESSION: 06/21/2022 Pt tearful today and last session, which she states has been her baseline and she is frustrated with this presentation.  She has good ability for most ther ex. Discussed continued recommendation to go to pool for exercise every week if able. Pt reports ongoing difficulty with fibromyalgia flare ups, including being unable to move or get out of bed for a couple days. Discussed behavior health referral, for help with chronic pain management, pt in agreement that it would be helpful. I also  think it would be helpful for her to have a chronic pain dr follow her, will discuss with PCP. Pt has been seen for 7 visits. She is doing well with activity, with no increased pain from sessions. She has started using TENS unit for pain. She will benefit from continued care for ongoing education on chronic pain management, and progression of strengthening as able.     Eval: Pt presents with primary complaint of increased pain in L knee. She also has widespread pain and weakness from fibromyalgia that makes her mobility more difficult. She has soreness in knee, quad, as well as into L gr troch and ITB. She has weakness in quads and LEs. She has decreased ability for functional activity , due to pain and weakness, and variable energy levels/fatigue. Pt with difficulty maintaining exercise program in the past, but did like aquatic exercise. Will discuss aquatic exercise further, as well as other home pain relief ideas, Tens unit and/or percussion gun. Pt to benefit from skilled PT to improve deficits and pain.      OBJECTIVE IMPAIRMENTS decreased activity tolerance, decreased endurance, decreased knowledge of use of DME, decreased mobility, decreased strength, increased muscle spasms, improper body mechanics, and pain.    ACTIVITY LIMITATIONS meal prep, cleaning, laundry, driving, shopping, community activity, occupation, and yard work.    PERSONAL FACTORS  Fibromyalgia  are also affecting patient's functional outcome.      REHAB POTENTIAL: Good   CLINICAL DECISION MAKING: Stable/uncomplicated   EVALUATION COMPLEXITY: Low      GOALS: Goals reviewed with patient? Yes   SHORT TERM GOALS: Target date: 05/25/22   Pt to be independent with initial HEP   Goal status: MET       LONG TERM GOALS: Target date: 08/02/2022    Pt to be independent  with final HEP   Goal status: In Progress    2.  Pt to report decreased pain in L knee, to 0-3/10 with activity.    Goal status: In Progress    3. Pt to voice 1-2 home pain relief strategies for days where she is in more pain from fibromyalgia.    Goal status: MET   4.  Pt to demo improved strength of hips to at least 4/5 to improve pain,  ambulation and stairs.    Goal status: ongoing    5. Pt to demo ability for bend, lift squat with optimal mechanics for back and knee, without pain greater than 3/10, to improve ability for job duties.   Goal Status: ongoing          PLAN: PT FREQUENCY: 1-2x/week   PT DURATION: 6 weeks   PLANNED INTERVENTIONS: Therapeutic exercises, Therapeutic activity, Neuromuscular re-education, Balance training, Gait training, Patient/Family education, Joint manipulation, Joint mobilization, Stair training, DME instructions, Aquatic Therapy, Dry Needling, Electrical stimulation, Spinal manipulation, Spinal mobilization, Cryotherapy, Moist heat, Taping, Vasopneumatic device, Traction, Ultrasound, Ionotophoresis 22m/ml Dexamethasone, and Manual therapy   PLAN FOR NEXT SESSION:  progressive strength and pain relief for L knee as tolerated, continued education on chronic pain management.    LLyndee Hensen PT, DPT 10:46 PM  06/21/22

## 2022-06-22 ENCOUNTER — Encounter: Payer: 59 | Admitting: Physical Therapy

## 2022-06-22 LAB — FOOD ALLERGY PROFILE
Allergen, Salmon, f41: <0.1 kU/L
Almonds: <0.1 kU/L
CLASS: 0
CLASS: 0
CLASS: 0
CLASS: 0
CLASS: 0
CLASS: 0
CLASS: 0
CLASS: 0
CLASS: 0
CLASS: 0
CLASS: 0
Cashew IgE: <0.1 kU/L
Class: 0
Class: 0
Class: 0
Class: 0
Egg White IgE: <0.1 kU/L
Fish Cod: <0.1 kU/L
Hazelnut: <0.1 kU/L
Milk IgE: <0.1 kU/L
Peanut IgE: <0.1 kU/L
Scallop IgE: <0.1 kU/L
Sesame Seed f10: <0.1 kU/L
Shrimp IgE: <0.1 kU/L
Soybean IgE: <0.1 kU/L
Tuna IgE: <0.1 kU/L
Walnut: <0.1 kU/L
Wheat IgE: <0.1 kU/L

## 2022-06-22 LAB — INTERPRETATION:

## 2022-06-22 LAB — SPECIMEN STATUS REPORT

## 2022-06-22 NOTE — Assessment & Plan Note (Addendum)
pt very tearful today, still waiting on Rheumatology referral, looks like rejected by first office, waiting on 2nd to respond. Seen by Neurology and had cognitive testing & MRI all normal, but she was not having her "fugue" symptoms at that time where she completely forgets how to do simple things, can't focus. reports latest episode started as migraine and set off cascade of physical & mental sx. pt also waiting on [facility] referral, pt given [facility] phone # to call and schedule.

## 2022-06-22 NOTE — Assessment & Plan Note (Signed)
pt tearful today, waiting on her psychiatry appointment, having "fugue" episodes which are very stressful and affecting her job performance. pt given [facility] # to call as appears they have reached out to her unsucessfully. Offered emotional support, pt wants to hold off on starting meds until seen as she has a few already.

## 2022-06-24 LAB — GLIA (IGA/G) + TTG IGA
Antigliadin Abs, IgA: 2 units (ref 0–19)
Gliadin IgG: 2 units (ref 0–19)
Transglutaminase IgA: <2 U/mL (ref 0–3)

## 2022-06-28 ENCOUNTER — Ambulatory Visit: Payer: 59 | Admitting: Clinical

## 2022-06-28 ENCOUNTER — Ambulatory Visit (INDEPENDENT_AMBULATORY_CARE_PROVIDER_SITE_OTHER): Payer: 59 | Admitting: Physical Therapy

## 2022-06-28 ENCOUNTER — Encounter: Payer: Self-pay | Admitting: Physical Therapy

## 2022-06-28 DIAGNOSIS — M6281 Muscle weakness (generalized): Secondary | ICD-10-CM | POA: Diagnosis not present

## 2022-06-28 DIAGNOSIS — M25562 Pain in left knee: Secondary | ICD-10-CM

## 2022-06-28 NOTE — Therapy (Addendum)
OUTPATIENT PHYSICAL THERAPY TREATMENT NOTE/Re-Cert PHYSICAL THERAPY DISCHARGE SUMMARY  Visits from Start of Care: 8  Current functional level related to goals / functional outcomes: Unable to assess due to unplanned discharge  Remaining deficits: Unable to assess due to unplanned discharge  Education / Equipment: Unable to assess due to unplanned discharge   Patient agrees to discharge. Patient goals were not met. Patient is being discharged due to not returning since the last visit.   3:56 PM, 09/27/22 Michele Landry, DPT Physical Therapy with [place]   Patient Name: Savannah George MRN: [id number] DOB:04/12/1981, 41 y.o., female Today's Date: 06/21/2022   END OF SESSION:   PT End of Session - 06/28/22 0845     Visit Number 8    Number of Visits 20    Date for PT Re-Evaluation 08/02/22    Authorization Type [employer]    PT Start Time 0846    PT Stop Time 0925    PT Time Calculation (min) 39 min    Activity Tolerance Patient tolerated treatment well    Behavior During Therapy WFL for tasks assessed/performed                Past Medical History:  Diagnosis Date   Abnormal Pap smear of cervix    age 18 or 19   Anemia    Anxiety    Back pain    Depression    DVT (deep venous thrombosis) (HCC)    Fibroma    Fibromyalgia 2019   GERD (gastroesophageal reflux disease)    Hyperlipidemia    IBS (irritable bowel syndrome)    Kidney stones    Migraines    Other fatigue    Pneumonia    Poor sleep    Prediabetes    Shortness of breath on exertion    Sleep apnea    BiPAP   Thyroid disease    Urinary incontinence    Vitamin D deficiency    Past Surgical History:  Procedure Laterality Date   CRYOTHERAPY     For abnormal pap smear, age 18 or 19   INTRAUTERINE DEVICE INSERTION     PARAGUARD   Patient Active Problem List   Diagnosis Date Noted   GERD (gastroesophageal reflux disease) 06/20/2020   Prediabetes 06/20/2020   IBS (irritable bowel syndrome)  03/16/2020   Chronic fatigue 02/04/2019   Fibromyalgia 02/04/2019   OSA, without need for CPAP 02/04/2019   IUD, Mirena, placed 12/25/18 02/04/2019   Urinary incontinence 11/06/2018   Sleep disorder 10/25/2018   Morbid obesity (HCC) 10/25/2018   Depression, major, single episode, moderate (HCC) 10/25/2018   Amenorrhea 10/25/2018   Family history of ovarian cancer 10/25/2018   Pure hypercholesterolemia 10/25/2018   Seasonal allergies 10/25/2018   PCP: Caleb Paker   REFERRING PROVIDER: Caleb Parker   REFERRING DIAG: L knee pain, fibromyalgia    THERAPY DIAG:  Acute pain of left knee   Muscle weakness (generalized)   Rationale for Evaluation and Treatment Rehabilitation   ONSET DATE:    SUBJECTIVE:    SUBJECTIVE STATEMENT: 06/28/2022 States she has been having soreness in her left knee in the same spot with 2/10 and her shoulders are 3/10 pain.    Eval: Pt states L knee pain, for about 1 month, no injury to report. Notes when L knee is sore, L hip also gets sore.  L Knee: creaking/cracking, Stairs difficult,  Home: just a few stairs to leave house.  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 Shelving.    Also has Fibromyalgia which she feels is significantly impacting her mobility. States some good days, some very bad, with poor ability for mobility , brain fog, and increased pain. Has widespread pain, worst in her quads, shoulder blades, she has exercised in pool in the past and liked that.        PERTINENT HISTORY: Fibromyalgia,    PAIN:  Are you having pain? Yes: NPRS scale: 12-3/10 Pain location: L knee and shoulders Pain description: sore, painful Aggravating factors: increased activity  Relieving factors: none stated    PRECAUTIONS: None   WEIGHT BEARING RESTRICTIONS No   FALLS:  Has patient fallen in last 6 months? Yes. Number of falls 0     PLOF: Independent   PATIENT GOALS Decreased pain in Knee, improved mobility      OBJECTIVE:  updated  06/21/22      COGNITION:           Overall cognitive status: Within functional limits for tasks assessed                 POSTURE: poor seated posture with rounded shoulders and back.    PALPATION: Tenderness in bil quad, and L UT, Medial L knee    LOWER EXTREMITY ROM:   Hips: WFL,  Knees: WFL     LOWER EXTREMITY MMT:   Hips: 4-/5, Knee: 4/5      TODAY'S TREATMENT:  06/28/2022 Therapeutic Exercise:  Aerobic: bike level 2,  8 minutes  Supine:  SLR with cues to DF and breath 2x10 B slow and controlled, hamstring iso on ball 2 minutes, DKC on ball 2 minutes, LTR on ball 2 minutes, SL ham curl on ball 3x10 B Prone:   Seated:   Standing:  Stretches:  Neuromuscular Re-education: Manual Therapy:  Self Care: Modalities:      06/14/22: Therapeutic Exercise:  Aerobic: bike level 2,  8 minutes  Supine: SLR 3reps x 2 each bil with TA cuing.  Prone:   Seated: Sit to stand x 10;   Standing: Step up 6 in x 10 bil, no UE support; Hip abd 2x10 bil; marching x 20 with focus on trunk and hip control;  Stretches: supine hip flexor stretch on L, off side of table x 2 min;  Neuromuscular Re-education: Manual Therapy: STM to lateral quad Self Care: Modalities:       06/11/22: Therapeutic Exercise:  Aerobic: bike level 2,  8 minutes  Supine: TA with education on achieving contraction x 2 min;  Supine March x 15;  Prone:  Seated: LAQ s, 2lb x 15 B 3" holds,  Sit to stand x 10;   Standing: Step up 6 in x 10 bil, no UE support; Hip abd 2x10 bil;  Neuromuscular Re-education: Manual Therapy:  Self Care: Modalities: TENS to bil thighs, x 6 min, with education on home use. Education/tiral  on home use of percussion gun, to L thigh/quad x4 min      PATIENT EDUCATION:  Education details: reviewed HEP  Person educated: Patient Education method: Explanation, Demonstration, Tactile cues, and Verbal cues Education comprehension: verbalized understanding, returned demonstration, verbal  cues required, tactile cues required, and needs further education     HOME EXERCISE PROGRAM: Access Code: [email or web address]    ASSESSMENT:   CLINICAL IMPRESSION: 06/28/2022 Patient tolerated session moderately well. Utilized ball for assisted motions which was tolerated well. Fatigue in legs noted quickly but overall patient enjoyed new exercises and interested in obtaining ball for home use. No increase in pain  noted during session. Will continue with current POC as tolerated. Patient reported her back felt the best it has ever felt after a session.    Eval: Pt presents with primary complaint of increased pain in L knee. She also has widespread pain and weakness from fibromyalgia that makes her mobility more difficult. She has soreness in knee, quad, as well as into L gr troch and ITB. She has weakness in quads and LEs. She has decreased ability for functional activity , due to pain and weakness, and variable energy levels/fatigue. Pt with difficulty maintaining exercise program in the past, but did like aquatic exercise. Will discuss aquatic exercise further, as well as other home pain relief ideas, Tens unit and/or percussion gun. Pt to benefit from skilled PT to improve deficits and pain.      OBJECTIVE IMPAIRMENTS decreased activity tolerance, decreased endurance, decreased knowledge of use of DME, decreased mobility, decreased strength, increased muscle spasms, improper body mechanics, and pain.    ACTIVITY LIMITATIONS meal prep, cleaning, laundry, driving, shopping, community activity, occupation, and yard work.    PERSONAL FACTORS  Fibromyalgia  are also affecting patient's functional outcome.      REHAB POTENTIAL: Good   CLINICAL DECISION MAKING: Stable/uncomplicated   EVALUATION COMPLEXITY: Low     GOALS: Goals reviewed with patient? Yes   SHORT TERM GOALS: Target date: 05/25/22   Pt to be independent with initial HEP   Goal status: MET       LONG TERM GOALS: Target date:  08/02/2022    Pt to be independent  with final HEP   Goal status: In Progress    2.  Pt to report decreased pain in L knee, to 0-3/10 with activity.    Goal status: In Progress    3. Pt to voice 1-2 home pain relief strategies for days where she is in more pain from fibromyalgia.    Goal status: MET   4.  Pt to demo improved strength of hips to at least 4/5 to improve pain,  ambulation and stairs.    Goal status: ongoing    5. Pt to demo ability for bend, lift squat with optimal mechanics for back and knee, without pain greater than 3/10, to improve ability for job duties.   Goal Status: ongoing          PLAN: PT FREQUENCY: 1-2x/week   PT DURATION: 6 weeks   PLANNED INTERVENTIONS: Therapeutic exercises, Therapeutic activity, Neuromuscular re-education, Balance training, Gait training, Patient/Family education, Joint manipulation, Joint mobilization, Stair training, DME instructions, Aquatic Therapy, Dry Needling, Electrical stimulation, Spinal manipulation, Spinal mobilization, Cryotherapy, Moist heat, Taping, Vasopneumatic device, Traction, Ultrasound, Ionotophoresis 4mg/ml Dexamethasone, and Manual therapy   PLAN FOR NEXT SESSION:  progressive strength and pain relief for L knee as tolerated, continued education on chronic pain management.    9:26 AM, 06/28/22 Michele Landry, DPT Physical Therapy with [facility]  

## 2022-06-28 NOTE — Progress Notes (Signed)
Hi Jeniya,  All of your food allergy and gluten testing came back negative. This is not an all-inclusive testing of all foods, but the most common ones causing allergies.  Take care.

## 2022-07-03 ENCOUNTER — Ambulatory Visit: Payer: 59 | Admitting: Family Medicine

## 2022-07-04 NOTE — Telephone Encounter (Signed)
[employer] has a denied request on file for [facility] for this member.

## 2022-07-05 ENCOUNTER — Encounter: Payer: 59 | Admitting: Physical Therapy

## 2022-07-09 ENCOUNTER — Ambulatory Visit: Payer: 59 | Admitting: Family Medicine

## 2022-07-13 ENCOUNTER — Telehealth: Payer: Self-pay | Admitting: Family Medicine

## 2022-07-13 NOTE — Telephone Encounter (Signed)
Pt states -experiencing "a lot of brain fog" and is missing appointments because she cannot track the days. -was told about the possibility of having a patient advocate referral.   Pt requests -call back regarding patient advocate process.

## 2022-07-13 NOTE — Telephone Encounter (Signed)
I am not sure what this is. Is this something her insurance offers?  Algis Greenhouse. Jerline Pain, MD 07/13/2022 12:46 PM

## 2022-07-13 NOTE — Telephone Encounter (Signed)
Please advise 

## 2022-07-19 NOTE — Telephone Encounter (Signed)
Left message with complete information And return call to our office at their convenience.

## 2022-08-01 ENCOUNTER — Encounter (INDEPENDENT_AMBULATORY_CARE_PROVIDER_SITE_OTHER): Payer: Self-pay

## 2022-08-02 ENCOUNTER — Telehealth: Payer: Self-pay | Admitting: *Deleted

## 2022-08-02 NOTE — Telephone Encounter (Signed)
(  Key: [id number]) Rx #: [id number] Ozempic (0.25 or 0.5 MG/DOSE) '2MG'$ /1.5ML pen-injectors Waiting for determination

## 2022-08-17 NOTE — Telephone Encounter (Signed)
Rx Ozempic was denied

## 2022-09-12 ENCOUNTER — Ambulatory Visit: Payer: 59 | Admitting: Obstetrics and Gynecology

## 2022-09-17 ENCOUNTER — Encounter: Payer: Self-pay | Admitting: *Deleted

## 2022-09-18 NOTE — Progress Notes (Signed)
GYNECOLOGY  VISIT   HPI: 41 y.o.   Divorced White or Caucasian Not Hispanic or Latino  female   [id number] with No LMP recorded. (Menstrual status: IUD).   here for  consult to discuss HRT.  Complains of ? Menopause symptoms, hot flashes, amenorrhea with Mirena.  HX DVT 2013. Mirena IUD placed in 1/20.  She has problems with her fibromyalgia. 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   She is having trouble sleeping, mostly falls asleep okay, but wakes up and can't fall back asleep. Getting less than 4 hours of sleep a night. Doesn't rest well. Then her muscles lock up. She has night sweats most night. Occasional hot flashes.  No vaginal dryness.   She previously tried gabapentin. She didn't tolerate it, made her too tired. She also can't take SSRI's.   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 She hasn't been formally evaluated.   On CPAP for sleep apnea.  Brain fog is so bad that she is having trouble functioning.   05/10/22 TSH 2.26  She and her partner broke up  She doesn't have good support  GYNECOLOGIC HISTORY  LMP: amenorrhea Contraception: Mirena IUD inserted 12/25/18 Menopausal hormone therapy: none        OB History     Gravida  0   Para  0   Term  0   Preterm  0   AB  0   Living  0      SAB  0   IAB  0   Ectopic  0   Multiple  0   Live Births  0              Patient Active Problem List   Diagnosis Date Noted   GERD (gastroesophageal reflux disease) 06/20/2020   Prediabetes 06/20/2020   IBS (irritable bowel syndrome) 03/16/2020   Chronic fatigue 02/04/2019   Fibromyalgia 02/04/2019   OSA, without need for CPAP 02/04/2019   IUD, Mirena, placed 12/25/18 02/04/2019   Urinary incontinence 11/06/2018   Sleep disorder 10/25/2018   Morbid obesity ([place]) 10/25/2018   Depression, major, single episode, moderate ([place]) 10/25/2018   Amenorrhea 10/25/2018   Family  history of ovarian cancer 10/25/2018   Pure hypercholesterolemia 10/25/2018   Seasonal allergies 10/25/2018    Past Medical History:  Diagnosis Date   Abnormal Pap smear of cervix    age 21 or 46   Anemia    Anxiety    Back pain    Depression    DVT (deep venous thrombosis) ([place])    DVT of axillary vein, acute left ([place])    2013   Fibroma    Fibromyalgia 2019   Fibromyalgia    GERD (gastroesophageal reflux disease)    Hyperlipidemia    IBS (irritable bowel syndrome)    Kidney stones    Migraines    Other fatigue    Pneumonia    Poor sleep    Prediabetes    Shortness of breath on exertion    Sleep apnea    BiPAP   Thyroid disease    Urinary incontinence    Vitamin D deficiency     Past Surgical History:  Procedure Laterality Date   CRYOTHERAPY     For abnormal pap smear, age 71 or 89   INTRAUTERINE DEVICE INSERTION     PARAGUARD    Current Outpatient Medications  Medication Sig Dispense Refill   Acetaminophen (  PAIN RELIEF PO) Take 1 tablet by mouth daily.     buPROPion (WELLBUTRIN XL) 150 MG 24 hr tablet Take 150 mg by mouth every morning.     levonorgestrel (MIRENA) 20 MCG/24HR IUD 1 each by Intrauterine route once.     Multiple Vitamins-Minerals (MULTIVITAMIN PO) Take 1 tablet by mouth daily.      naproxen sodium (ALEVE) 220 MG tablet Take 220 mg by mouth daily as needed.     NON FORMULARY Otc sleep tonight     NON FORMULARY Fibromyalgia Support otc     ondansetron (ZOFRAN-ODT) 8 MG disintegrating tablet Take 1 tablet (8 mg total) by mouth every 8 (eight) hours as needed for nausea or vomiting. 20 tablet 0   pantoprazole (PROTONIX) 40 MG tablet Take 1 tablet (40 mg total) by mouth 2 (two) times daily before a meal. **PLEASE CONTACT OFFICE TO SCHEDULE FOLLOW UP 60 tablet 5   Probiotic Product (PROBIOTIC PEARLS WOMENS PO) Take 1 each by mouth daily.     tiZANidine (ZANAFLEX) 4 MG tablet TAKE 1 TABLET BY MOUTH EVERY 6 HOURS AS NEEDED FOR MUSCLE SPASMS. 90 tablet  1   Semaglutide-Weight Management (WEGOVY) 0.25 MG/0.5ML SOAJ Inject 0.25 mg into the skin once a week. For 4 weeks (Patient not taking: Reported on 09/21/2022) 2 mL 0   No current facility-administered medications for this visit.     ALLERGIES: Aspirin, Codeine, and Latex  Family History  Problem Relation Age of Onset   Anxiety disorder Mother    Depression Mother    Kidney disease Mother    Hypertension Mother    Diabetes Mother    Kidney cancer Mother    Renal cancer Mother    Obesity Father    Sleep apnea Father    Anxiety disorder Father    Depression Father    Hyperlipidemia Father    Diabetes Father    Heart disease Father    Stroke Father    Colon polyps Father    Hypertension Father    Uterine cancer Sister    Colon polyps Sister    Endometriosis Sister    Hypertension Sister    Other Sister        Lung disease   Ovarian cancer Sister        hysterectomy   Other Brother        Lung disease   Diabetes Paternal Grandmother    Heart disease Paternal Grandmother        CHF   Diabetes Paternal Grandfather    Stroke Paternal Grandfather    Heart attack Paternal Grandfather    Kidney disease Maternal Uncle    Diabetes Paternal Aunt    Heart disease Other    Depression Other    Anxiety disorder Other    Sleep apnea Other    Obesity Other     Social History   Socioeconomic History   Marital status: Divorced    Spouse name: Not on file   Number of children: 0   Years of education: Not on file   Highest education level: Master's degree (e.g., MA, MS, MEng, MEd, MSW, MBA)  Occupational History   Occupation: Tree surgeon: [employer]  Tobacco Use   Smoking status: Never   Smokeless tobacco: Never  Vaping Use   Vaping Use: Never used  Substance and Sexual Activity   Alcohol use: Yes    Alcohol/week: 0.0 - 3.0 standard drinks of alcohol    Comment: occasional  Drug use: Never   Sexual activity: Not Currently    Partners: Male,  Female    Birth control/protection: I.U.D.  Other Topics Concern   Not on file  Social History Narrative   Recently moved back to [place] after divorcing husband. Went to college here and still has friends here. Enjoys hiking. Trying to integrate herself back into the community.    Social Determinants of Health   Financial Resource Strain: Not on file  Food Insecurity: Not on file  Transportation Needs: Not on file  Physical Activity: Not on file  Stress: Not on file  Social Connections: Not on file  Intimate Partner Violence: Not on file    Review of Systems  All other systems reviewed and are negative.   PHYSICAL EXAMINATION:    BP 126/82 (BP Location: Left Arm, Patient Position: Sitting, Cuff Size: Large)   Wt 219 lb (99.3 kg)   BMI 41.38 kg/m     General appearance: alert, cooperative and appears stated age Tearful throughout the visit.  1. Night sweats She is amenorrheic from the IUD. Night sweats are her only menopausal symptoms. Normal TSH - Follicle stimulating hormone  2. Brain fog Severe, incapacitating. She doesn't sleep, has muscle pains. Looking for a Fibromyalgia specialist  3. Sleep disturbance Severe, even with CPAP I recommended she speak with her primary about a formal sleep study.  Also recommended she get formal testing and possible treatment for ADHD.   Spent 35 minutes in total patient care.

## 2022-09-21 ENCOUNTER — Ambulatory Visit: Payer: 59 | Admitting: Obstetrics and Gynecology

## 2022-09-21 ENCOUNTER — Encounter: Payer: Self-pay | Admitting: Obstetrics and Gynecology

## 2022-09-21 VITALS — BP 126/82 | Wt 219.0 lb

## 2022-09-21 DIAGNOSIS — R4189 Other symptoms and signs involving cognitive functions and awareness: Secondary | ICD-10-CM

## 2022-09-21 DIAGNOSIS — G479 Sleep disorder, unspecified: Secondary | ICD-10-CM | POA: Diagnosis not present

## 2022-09-21 DIAGNOSIS — R61 Generalized hyperhidrosis: Secondary | ICD-10-CM

## 2022-09-21 NOTE — Patient Instructions (Signed)
1) Get a formal testing for ADHD  2) Get in for a formal sleep study since you aren't sleeping with CPAP  3) Call [facility] and [facility] for a fibromyalgia specialist

## 2022-09-22 LAB — FOLLICLE STIMULATING HORMONE: FSH: 59.2 m[IU]/mL

## 2022-09-27 ENCOUNTER — Ambulatory Visit (INDEPENDENT_AMBULATORY_CARE_PROVIDER_SITE_OTHER): Payer: 59

## 2022-09-27 DIAGNOSIS — Z23 Encounter for immunization: Secondary | ICD-10-CM

## 2022-09-27 NOTE — Progress Notes (Signed)
3rd Gardasil vaccination given IM Right deltoid.  Patient tolerated injection well.  Last AEX 05/24/22.

## 2022-10-16 ENCOUNTER — Encounter: Payer: Self-pay | Admitting: Obstetrics and Gynecology

## 2022-10-16 ENCOUNTER — Ambulatory Visit: Payer: 59 | Admitting: Obstetrics and Gynecology

## 2022-10-16 VITALS — BP 112/64 | HR 68 | Wt 217.0 lb

## 2022-10-16 DIAGNOSIS — R4189 Other symptoms and signs involving cognitive functions and awareness: Secondary | ICD-10-CM | POA: Diagnosis not present

## 2022-10-16 DIAGNOSIS — G479 Sleep disorder, unspecified: Secondary | ICD-10-CM | POA: Diagnosis not present

## 2022-10-16 DIAGNOSIS — N951 Menopausal and female climacteric states: Secondary | ICD-10-CM

## 2022-10-16 NOTE — Progress Notes (Signed)
GYNECOLOGY  VISIT   HPI: 41 y.o.   Divorced White or Caucasian Not Hispanic or Latino  female   G0P0000 with No LMP recorded. (Menstrual status: IUD).   here to discuss lab results.  The patient was seen in 9/23 c/o night sweats. She has a mirena IUD, placed in 1/20 and has amenorrhea. [place] returned at 59.2. TSH from earlier this year was normal.  She has multiple medical problems, the worst of which is fibromyalgia, sleep disturbance, and brain fog. She has a h/o DVT, so isn't a candidate for ERT.   She previously didn't tolerate gabapentin or SSRI's.  Gabapentin made her too sleepy, SSRI's made her suicidal and she disassociates.   She tried cymbalta, was so suicidal she had to come off of it. She was on it for a couple of weeks, but symptoms lasted for months.  She just did a DNA test to see if there are other medications she could tolerate better.  Other meds she has tried: lyrica, amitriptyline and lexapro.  She isn't sleeping. She is on ByPap for sleep apnea, still doesn't sleep.  She also thinks she has ADD, but hasn't had formal testing.   She has a new primary MD.   She will discuss Neurology consult with a formal sleep study with her primary. Also discussed getting formal testing for ADD.  Currently her night sweats are better. Some hot flashes during the day, worst in the am, okay in the afternoon. Overall tolerable.   GYNECOLOGIC HISTORY: No LMP recorded. (Menstrual status: IUD). Contraception:IUD Menopausal hormone therapy: none         OB History     Gravida  0   Para  0   Term  0   Preterm  0   AB  0   Living  0      SAB  0   IAB  0   Ectopic  0   Multiple  0   Live Births  0              Patient Active Problem List   Diagnosis Date Noted   GERD (gastroesophageal reflux disease) 06/20/2020   Prediabetes 06/20/2020   IBS (irritable bowel syndrome) 03/16/2020   Chronic fatigue 02/04/2019   Fibromyalgia 02/04/2019   OSA, without need  for CPAP 02/04/2019   IUD, Mirena, placed 12/25/18 02/04/2019   Urinary incontinence 11/06/2018   Sleep disorder 10/25/2018   Morbid obesity ([place]) 10/25/2018   Depression, major, single episode, moderate ([place]) 10/25/2018   Amenorrhea 10/25/2018   Family history of ovarian cancer 10/25/2018   Pure hypercholesterolemia 10/25/2018   Seasonal allergies 10/25/2018    Past Medical History:  Diagnosis Date   Abnormal Pap smear of cervix    age 64 or 72   Anemia    Anxiety    Back pain    Depression    DVT (deep venous thrombosis) ([place])    DVT of axillary vein, acute left ([place])    2013   Fibroma    Fibromyalgia 2019   Fibromyalgia    GERD (gastroesophageal reflux disease)    Hyperlipidemia    IBS (irritable bowel syndrome)    Kidney stones    Migraines    Other fatigue    Pneumonia    Poor sleep    Prediabetes    Shortness of breath on exertion    Sleep apnea    BiPAP   Thyroid disease    Urinary incontinence    Vitamin  D deficiency     Past Surgical History:  Procedure Laterality Date   CRYOTHERAPY     For abnormal pap smear, age 20 or 32   INTRAUTERINE DEVICE INSERTION     PARAGUARD    Current Outpatient Medications  Medication Sig Dispense Refill   Acetaminophen (PAIN RELIEF PO) Take 1 tablet by mouth daily.     buPROPion (WELLBUTRIN XL) 150 MG 24 hr tablet Take 150 mg by mouth every morning.     levonorgestrel (MIRENA) 20 MCG/24HR IUD 1 each by Intrauterine route once.     Multiple Vitamins-Minerals (MULTIVITAMIN PO) Take 1 tablet by mouth daily.      naproxen sodium (ALEVE) 220 MG tablet Take 220 mg by mouth daily as needed.     NON FORMULARY Otc sleep tonight     NON FORMULARY Fibromyalgia Support otc     ondansetron (ZOFRAN-ODT) 8 MG disintegrating tablet Take 1 tablet (8 mg total) by mouth every 8 (eight) hours as needed for nausea or vomiting. 20 tablet 0   pantoprazole (PROTONIX) 40 MG tablet Take 1 tablet (40 mg total) by mouth 2 (two) times daily before  a meal. **PLEASE CONTACT OFFICE TO SCHEDULE FOLLOW UP 60 tablet 5   Probiotic Product (PROBIOTIC PEARLS WOMENS PO) Take 1 each by mouth daily.     Semaglutide-Weight Management (WEGOVY) 0.25 MG/0.5ML SOAJ Inject 0.25 mg into the skin once a week. For 4 weeks (Patient not taking: Reported on 09/21/2022) 2 mL 0   tiZANidine (ZANAFLEX) 4 MG tablet TAKE 1 TABLET BY MOUTH EVERY 6 HOURS AS NEEDED FOR MUSCLE SPASMS. 90 tablet 1   No current facility-administered medications for this visit.     ALLERGIES: Aspirin, Codeine, and Latex  Family History  Problem Relation Age of Onset   Anxiety disorder Mother    Depression Mother    Kidney disease Mother    Hypertension Mother    Diabetes Mother    Kidney cancer Mother    Renal cancer Mother    Obesity Father    Sleep apnea Father    Anxiety disorder Father    Depression Father    Hyperlipidemia Father    Diabetes Father    Heart disease Father    Stroke Father    Colon polyps Father    Hypertension Father    Uterine cancer Sister    Colon polyps Sister    Endometriosis Sister    Hypertension Sister    Other Sister        Lung disease   Ovarian cancer Sister        hysterectomy   Other Brother        Lung disease   Diabetes Paternal Grandmother    Heart disease Paternal Grandmother        CHF   Diabetes Paternal Grandfather    Stroke Paternal Grandfather    Heart attack Paternal Grandfather    Kidney disease Maternal Uncle    Diabetes Paternal Aunt    Heart disease Other    Depression Other    Anxiety disorder Other    Sleep apnea Other    Obesity Other     Social History   Socioeconomic History   Marital status: Divorced    Spouse name: Not on file   Number of children: 0   Years of education: Not on file   Highest education level: Master's degree (e.g., MA, MS, MEng, MEd, MSW, MBA)  Occupational History   Occupation: Tree surgeon:  [employer]  Tobacco Use   Smoking status: Never    Smokeless tobacco: Never  Vaping Use   Vaping Use: Never used  Substance and Sexual Activity   Alcohol use: Yes    Alcohol/week: 0.0 - 3.0 standard drinks of alcohol    Comment: occasional   Drug use: Never   Sexual activity: Not Currently    Partners: Male, Female    Birth control/protection: I.U.D.  Other Topics Concern   Not on file  Social History Narrative   Recently moved back to [place] after divorcing husband. Went to college here and still has friends here. Enjoys hiking. Trying to integrate herself back into the community.    Social Determinants of Health   Financial Resource Strain: Not on file  Food Insecurity: Not on file  Transportation Needs: Not on file  Physical Activity: Not on file  Stress: Not on file  Social Connections: Not on file  Intimate Partner Violence: Not on file    Review of Systems  All other systems reviewed and are negative.   PHYSICAL EXAMINATION:    There were no vitals taken for this visit.    General appearance: alert, cooperative and appears stated age  79. Perimenopausal vasomotor symptoms She has a mirena IUD, no cycles, recent [place] was elevated. Currently her night sweats are a little better. -Discussed avoiding triggers and behavior changes -Not a candidate for ERT -Hasn't tolerated gabapentin, SSRI's or SNRI's in the past. She is having testing to try and determine the best antidepressant for her.   2. Sleep disturbance She has been getting minimal sleep, she will discuss a formal sleep study with Neurology with her primary  3. Brain fog  Over 20 minutes in total patient care

## 2022-11-06 ENCOUNTER — Telehealth: Payer: Self-pay | Admitting: *Deleted

## 2022-11-06 NOTE — Telephone Encounter (Signed)
[id number]. WEGOVY INJ 0.'25MG'$  is approved through 11/07/2023 [id number] [phone] notified

## 2022-11-06 NOTE — Telephone Encounter (Signed)
(  Key: [id number]) Rx #: [id number] ELMRAJ 0.'25MG'$ /0.5ML auto-injectors Send today  Waiting for determination

## 2022-11-26 ENCOUNTER — Other Ambulatory Visit: Payer: Self-pay | Admitting: Family Medicine

## 2022-12-12 ENCOUNTER — Encounter: Payer: Self-pay | Admitting: *Deleted

## 2022-12-27 ENCOUNTER — Encounter: Payer: Self-pay | Admitting: *Deleted

## 2023-01-04 ENCOUNTER — Ambulatory Visit: Payer: 59 | Admitting: Internal Medicine

## 2023-01-21 NOTE — Progress Notes (Signed)
01/22/2023 Savannah George [id number] October 04, 1981  Referring provider: Harrison Mons, PA Primary GI doctor: Dr. Hilarie Fredrickson  ASSESSMENT AND PLAN:   Gastroesophageal reflux disease worsening last 6 months No alarm symptoms, no dysphagia Has had weight gain last year of 20-25lbs Likely contributing to the GERD, discussed with patient, will try to talk with PCP Continue protonix twice a day, add on pepcid, if not helping can consider dexilant and repeat EGD Check H pylori and labs Follow up 8 weeks, consider endoscopic evaluation at that time  Morbid obesity  Body mass index is 41.57 kg/m.  -Patient has been advised to make an attempt to improve diet and exercise patterns to aid in weight loss. -Recommended diet heavy in fruits and veggies and low in animal meats, cheeses, and dairy products, appropriate calorie intake - Discussed referral to weight loss clinic, declines at this time   Patient Care Team: Vivi Barrack, MD as PCP - General (Family Medicine) Salvadore Dom, MD as Consulting Physician (Obstetrics and Gynecology)  HISTORY OF PRESENT ILLNESS: 42 y.o. female with a past medical history of FM, obesity and others listed below presents for evaluation of GERD.  08/09/2020 EGD LA grade a reflux esophagitis no bleeding, dilated, normal stomach, normal duodenum, biopsies unremarkable 10/18/2020 barium swallow for continuing dysphagia despite dilatation, unremarkable no narrowing no stricture.  Patient chest discomfort with swallowing of the barium tablet despite prompt passage in the stomach.  She has been on the protonix twice a day since 2021 and was doing well however last 6-8 months she has bene having worsening reflux.  She has increase weight being off ozempic, gained 20+ lbs since June.  Early dec she states everything has been bothering her for hours.  She is on aleve for FM, can take daily for several days or not need for weeks, no recent increase in use. Rare  ETOH once a month. No marijuana no drug use. No smoking.  She was on ozempic for a year but has been off for 2 years, has prescription for wegovy but could not fill the wegovy.  No melena, no AB pain.  No dysphagia.  No nocturnal symptoms, mainly during the day.  No family history of GB issues. GB 2011 AB [place] normal, HIDA 93%  Wt Readings from Last 5 Encounters:  01/22/23 220 lb (99.8 kg)  10/16/22 217 lb (98.4 kg)  09/21/22 219 lb (99.3 kg)  06/21/22 203 lb 4 oz (92.2 kg)  05/24/22 201 lb (91.2 kg)     She  reports that she has never smoked. She has never used smokeless tobacco. She reports current alcohol use. She reports that she does not use drugs.  Current Medications:   Current Outpatient Medications (Endocrine & Metabolic):    levonorgestrel (MIRENA) 20 MCG/24HR IUD, 1 each by Intrauterine route once.    Current Outpatient Medications (Analgesics):    Acetaminophen (PAIN RELIEF PO), Take 1 tablet by mouth daily.   naproxen sodium (ALEVE) 220 MG tablet, Take 220 mg by mouth daily as needed.   Current Outpatient Medications (Other):    famotidine (PEPCID) 40 MG tablet, Take 1 tablet (40 mg total) by mouth 2 (two) times daily.   Multiple Vitamins-Minerals (MULTIVITAMIN PO), Take 1 tablet by mouth daily.    NON FORMULARY, Otc sleep tonight   NON FORMULARY, Fibromyalgia Support otc   ondansetron (ZOFRAN-ODT) 8 MG disintegrating tablet, TAKE 1 TABLET BY MOUTH EVERY 8 HOURS AS NEEDED FOR NAUSEA OR VOMITING.   pantoprazole (PROTONIX)  40 MG tablet, Take 1 tablet (40 mg total) by mouth 2 (two) times daily before a meal. **PLEASE CONTACT OFFICE TO SCHEDULE FOLLOW UP   Probiotic Product (PROBIOTIC PEARLS WOMENS PO), Take 1 each by mouth daily.   tiZANidine (ZANAFLEX) 4 MG tablet, TAKE 1 TABLET BY MOUTH EVERY 6 HOURS AS NEEDED FOR MUSCLE SPASMS.   Semaglutide-Weight Management (WEGOVY) 0.25 MG/0.5ML SOAJ, Inject 0.25 mg into the skin once a week. For 4 weeks (Patient not taking:  Reported on 01/22/2023)  Medical History:  Past Medical History:  Diagnosis Date   Abnormal Pap smear of cervix    age 56 or 78   Anemia    Anxiety    Back pain    Depression    DVT (deep venous thrombosis) ([facility])    DVT of axillary vein, acute left ([place])    2013   Fibroma    Fibromyalgia 2019   GERD (gastroesophageal reflux disease)    Hyperlipidemia    IBS (irritable bowel syndrome)    Kidney stones    Migraines    Other fatigue    Pneumonia    Poor sleep    Prediabetes    Shortness of breath on exertion    Sleep apnea    BiPAP   Thyroid disease    Urinary incontinence    Vitamin D deficiency    Allergies:  Allergies  Allergen Reactions   Aspirin Hives   Codeine Other (See Comments)    migraine   Latex Hives     Surgical History:  She  has a past surgical history that includes Cryotherapy and Intrauterine device insertion. Family History:  Her family history includes Anxiety disorder in her father, mother, and another family member; Colon polyps in her father and sister; Depression in her father, mother, and another family member; Diabetes in her father, mother, paternal aunt, paternal grandfather, and paternal grandmother; Endometriosis in her sister; Heart attack in her paternal grandfather; Heart disease in her father, paternal grandmother, and another family member; Hyperlipidemia in her father; Hypertension in her father, mother, and sister; Kidney cancer in her mother; Kidney disease in her maternal uncle and mother; Obesity in her father and another family member; Other in her brother, father, and sister; Ovarian cancer in her sister; Renal cancer in her mother; Sleep apnea in her father and another family member; Stroke in her father and paternal grandfather; Uterine cancer in her sister.  REVIEW OF SYSTEMS  : All other systems reviewed and negative except where noted in the History of Present Illness.  PHYSICAL EXAM: BP 120/80   Pulse 72   Ht '5\' 1"'$  (1.549  m)   Wt 220 lb (99.8 kg)   BMI 41.57 kg/m  General:   Pleasant, well developed female in no acute distress Head:   Normocephalic and atraumatic. Eyes:  sclerae anicteric,conjunctive pink  Heart:   regular rate and rhythm, no murmurs or gallops Pulm:  Clear anteriorly; no wheezing Abdomen:   Soft, Obese AB, Active bowel sounds. mild tenderness in the entire abdomen. Without guarding and Without rebound, No organomegaly appreciated. Rectal: Not evaluated Extremities:  Without edema. Msk: Symmetrical without gross deformities. Peripheral pulses intact.  Neurologic:  Alert and  oriented x4;  No focal deficits.  Skin:   Dry and intact without significant lesions or rashes. Psychiatric:  Cooperative. Normal mood and affect.  RELEVANT LABS AND IMAGING: CBC    Component Value Date/Time   WBC 7.1 05/10/2022 0909   RBC 4.34 05/10/2022 0909  HGB 12.9 05/10/2022 0909   HGB 12.7 07/06/2021 0907   HCT 37.4 05/10/2022 0909   HCT 38.2 07/06/2021 0907   PLT 369.0 05/10/2022 0909   PLT 355 07/06/2021 0907   MCV 86.2 05/10/2022 0909   MCV 86 07/06/2021 0907   MCH 28.7 07/06/2021 0907   MCH 30.1 06/17/2020 1054   MCHC 34.4 05/10/2022 0909   RDW 13.8 05/10/2022 0909   RDW 13.8 07/06/2021 0907   LYMPHSABS 2.2 07/06/2021 0907   MONOABS 0.4 05/08/2019 0847   EOSABS 0.2 07/06/2021 0907   BASOSABS 0.1 07/06/2021 0907    CMP     Component Value Date/Time   NA 139 05/10/2022 0909   NA 137 07/06/2021 0907   K 3.9 05/10/2022 0909   CL 102 05/10/2022 0909   CO2 29 05/10/2022 0909   GLUCOSE 104 (H) 05/10/2022 0909   BUN 15 05/10/2022 0909   BUN 15 07/06/2021 0907   CREATININE 0.78 05/10/2022 0909   CALCIUM 9.7 05/10/2022 0909   PROT 7.6 05/10/2022 0909   PROT 7.1 07/06/2021 0907   ALBUMIN 4.6 05/10/2022 0909   ALBUMIN 4.6 07/06/2021 0907   AST 24 05/10/2022 0909   ALT 42 (H) 05/10/2022 0909   ALKPHOS 94 05/10/2022 0909   BILITOT 0.5 05/10/2022 0909   BILITOT 0.4 07/06/2021 0907    GFRNONAA >60 06/17/2020 1054   GFRAA >60 06/17/2020 [place] Darrien Belter, PA-C 3:40 PM

## 2023-01-22 ENCOUNTER — Other Ambulatory Visit (INDEPENDENT_AMBULATORY_CARE_PROVIDER_SITE_OTHER): Payer: 59

## 2023-01-22 ENCOUNTER — Ambulatory Visit: Payer: 59 | Admitting: Physician Assistant

## 2023-01-22 ENCOUNTER — Other Ambulatory Visit: Payer: Self-pay | Admitting: Physician Assistant

## 2023-01-22 ENCOUNTER — Encounter: Payer: Self-pay | Admitting: Physician Assistant

## 2023-01-22 VITALS — BP 120/80 | HR 72 | Ht 61.0 in | Wt 220.0 lb

## 2023-01-22 DIAGNOSIS — K219 Gastro-esophageal reflux disease without esophagitis: Secondary | ICD-10-CM

## 2023-01-22 LAB — SEDIMENTATION RATE: Sed Rate: 40 mm/hr — ABNORMAL HIGH (ref 0–20)

## 2023-01-22 LAB — CBC WITH DIFFERENTIAL/PLATELET
Basophils Absolute: 0.1 10*3/uL (ref 0.0–0.1)
Basophils Relative: 0.7 % (ref 0.0–3.0)
Eosinophils Absolute: 0.2 10*3/uL (ref 0.0–0.7)
Eosinophils Relative: 2.1 % (ref 0.0–5.0)
HCT: 38.8 % (ref 36.0–46.0)
Hemoglobin: 13.3 g/dL (ref 12.0–15.0)
Lymphocytes Relative: 31 % (ref 12.0–46.0)
Lymphs Abs: 3.1 10*3/uL (ref 0.7–4.0)
MCHC: 34.2 g/dL (ref 30.0–36.0)
MCV: 85 fl (ref 78.0–100.0)
Monocytes Absolute: 0.4 10*3/uL (ref 0.1–1.0)
Monocytes Relative: 4.2 % (ref 3.0–12.0)
Neutro Abs: 6.2 10*3/uL (ref 1.4–7.7)
Neutrophils Relative %: 62 % (ref 43.0–77.0)
Platelets: 363 10*3/uL (ref 150.0–400.0)
RBC: 4.57 Mil/uL (ref 3.87–5.11)
RDW: 14.2 % (ref 11.5–15.5)
WBC: 9.9 10*3/uL (ref 4.0–10.5)

## 2023-01-22 LAB — COMPREHENSIVE METABOLIC PANEL
ALT: 31 U/L (ref 0–35)
AST: 21 U/L (ref 0–37)
Albumin: 4.6 g/dL (ref 3.5–5.2)
Alkaline Phosphatase: 86 U/L (ref 39–117)
BUN: 12 mg/dL (ref 6–23)
CO2: 26 mEq/L (ref 19–32)
Calcium: 9.7 mg/dL (ref 8.4–10.5)
Chloride: 103 mEq/L (ref 96–112)
Creatinine, Ser: 0.69 mg/dL (ref 0.40–1.20)
GFR: 106.59 mL/min (ref >60.00)
Glucose, Bld: 123 mg/dL — ABNORMAL HIGH (ref 70–99)
Potassium: 3.7 mEq/L (ref 3.5–5.1)
Sodium: 138 mEq/L (ref 135–145)
Total Bilirubin: 0.3 mg/dL (ref 0.2–1.2)
Total Protein: 7.5 g/dL (ref 6.0–8.3)

## 2023-01-22 LAB — TSH: TSH: 1.65 u[IU]/mL (ref 0.35–5.50)

## 2023-01-22 MED ORDER — FAMOTIDINE 40 MG PO TABS: 40.0000 mg | ORAL_TABLET | Freq: Two times a day (BID) | ORAL | 3 refills | Status: DC

## 2023-01-22 NOTE — Progress Notes (Signed)
Addendum: Reviewed and agree with assessment and management plan. Dock Baccam M, MD  

## 2023-01-22 NOTE — Patient Instructions (Addendum)
Your provider has requested that you go to the basement level for lab work before leaving today. Press "B" on the elevator. The lab is located at the first door on the left as you exit the elevator.   We are ordering a "Diatherix" stool testing for you to evaluate you for a bacteria in your stomach called H pylori.  You have received a kit from our office today containing all necessary supplies to complete this test.  Please carefully read the stool collection instructions provided in the kit before opening the accompanying materials.   Important to remember: -Place the label from the top left corner of the laboratory request sheet onto the "puritan opti-swab" TUBE. -This label should include your full name and date of birth.  -After completing the test, you should secure the tube into the specimen biohazard bag.  -The laboratory request information sheet (including date and time of specimen collection) should be placed into the outside pocket of the specimen biohazard bag and returned to the [facility] lab with 2 days of collection.   If it is greater than two days from collection you will be asked to repeat the test.  If the label is missing from the tube with your name and date of birth on it, you will have to repeat the test.  Any questions please message [place] on my chart or call the office at [phone]   Please take your proton pump inhibitor medication, protonix twice a day  Please take this medication 30 minutes to 1 hour before meals- this makes it more effective.  ADD ON PEPCID 40 MG ONCE OR TWICE DAY, IF THIS DOES NOT HELP WE CAN TRY DEXILANT Avoid spicy and acidic foods Avoid fatty foods Limit your intake of coffee, tea, alcohol, and carbonated drinks Work to maintain a healthy weight Keep the head of the bed elevated at least 3 inches with blocks or a wedge pillow if you are having any nighttime symptoms Stay upright for 2 hours after eating Avoid meals and snacks three to four  hours before bedtime    We can refer you to the weight loss clinic if you would like or you can also talk with your primary care about medical options for weight loss.

## 2023-01-23 ENCOUNTER — Other Ambulatory Visit: Payer: Self-pay | Admitting: Internal Medicine

## 2023-02-01 ENCOUNTER — Other Ambulatory Visit (HOSPITAL_COMMUNITY): Payer: Self-pay

## 2023-02-01 ENCOUNTER — Telehealth: Payer: Self-pay | Admitting: Pharmacy Technician

## 2023-02-01 NOTE — Telephone Encounter (Signed)
PA has been submitted and telephone encounter has been created 

## 2023-02-01 NOTE — Telephone Encounter (Signed)
Patient Advocate Encounter  Received notification from [employer] that prior authorization for FAMOTIDINE 40MG is required.   PA submitted on 2.9.24 Key [id number] Status is pending

## 2023-02-07 NOTE — Telephone Encounter (Signed)
Just touching base on this prior authorization. Thanks for all you do.

## 2023-02-12 NOTE — Telephone Encounter (Signed)
Message to [place] and PA Team checking status of PA for famotidine BID. Pharmacy indicates it still will not go through as "PA required for BID"

## 2023-02-12 NOTE — Telephone Encounter (Signed)
Please provide status update to PA submitted 02-01-23. Thank you

## 2023-02-14 MED ORDER — FAMOTIDINE 40 MG PO TABS: 40.0000 mg | ORAL_TABLET | Freq: Two times a day (BID) | ORAL | 5 refills | Status: AC

## 2023-02-14 MED ORDER — FAMOTIDINE 40 MG PO TABS: 40.0000 mg | ORAL_TABLET | Freq: Two times a day (BID) | ORAL | 5 refills | Status: DC

## 2023-02-14 NOTE — Telephone Encounter (Signed)
Rx failed to go to [facility], I have reprinted and faxed it to [place].

## 2023-02-14 NOTE — Addendum Note (Signed)
Addended by: [place], Gwenivere Hiraldo E on: 02/14/2023 05:27 PM   Modules accepted: Orders

## 2023-02-14 NOTE — Telephone Encounter (Signed)
Patient calling to f/u on previous note. Please advise.

## 2023-02-14 NOTE — Telephone Encounter (Signed)
Patient called. Explained that she could use Good[facility] since insurance does not want to cover BID.  Good Rx codes sent to [facility] with a 30 day supply (#60) = $2.79.

## 2023-02-14 NOTE — Telephone Encounter (Signed)
Correction:  $2.79 is for 20 mg tablets.  40 mg bid = $4.99

## 2023-02-18 ENCOUNTER — Other Ambulatory Visit: Payer: Self-pay | Admitting: Obstetrics and Gynecology

## 2023-02-18 DIAGNOSIS — Z1231 Encounter for screening mammogram for malignant neoplasm of breast: Secondary | ICD-10-CM

## 2023-02-28 NOTE — Telephone Encounter (Signed)
Received a fax regarding Prior Authorization from [facility] for FAMOTIDINE. Authorization has been DENIED because .  Phone# [phone]

## 2023-03-11 ENCOUNTER — Encounter: Payer: Self-pay | Admitting: *Deleted

## 2023-03-12 ENCOUNTER — Institutional Professional Consult (permissible substitution): Payer: 59 | Admitting: Neurology

## 2023-04-01 ENCOUNTER — Encounter: Payer: Self-pay | Admitting: Physician Assistant

## 2023-04-03 ENCOUNTER — Ambulatory Visit
Admission: RE | Admit: 2023-04-03 | Discharge: 2023-04-03 | Disposition: A | Discharge status: Home / Self Care | Payer: 59 | Source: Ambulatory Visit | Attending: Obstetrics and Gynecology | Admitting: Obstetrics and Gynecology

## 2023-04-03 DIAGNOSIS — Z1231 Encounter for screening mammogram for malignant neoplasm of breast: Secondary | ICD-10-CM

## 2023-04-04 ENCOUNTER — Ambulatory Visit: Payer: 59 | Admitting: Neurology

## 2023-04-04 ENCOUNTER — Encounter: Payer: Self-pay | Admitting: Neurology

## 2023-04-04 VITALS — BP 135/95 | HR 74 | Ht 61.0 in | Wt 223.8 lb

## 2023-04-04 DIAGNOSIS — G4733 Obstructive sleep apnea (adult) (pediatric): Secondary | ICD-10-CM | POA: Diagnosis not present

## 2023-04-04 NOTE — Progress Notes (Signed)
Subjective:    Patient ID: Savannah George is a 42 y.o. female.  HPI    Savannah Foley, MD, PhD [facility] [address], [address] [address] [place], [place] [address]  Dear Savannah George,   I saw your patient, Savannah George, upon your kind request my sleep clinic today for reevaluation of her obstructive sleep apnea.  The patient is unaccompanied today.  As you know, Savannah George is a 42 year old female with an underlying medical history of irritable bowel syndrome, reflux disease, prediabetes, thyroid disease, history of kidney stones, vitamin D deficiency, history of DVT, fibromyalgia, anemia, anxiety, depression, back pain, and severe obesity with a BMI of over 40, who has been on AutoPap therapy for sleep apnea.  She was diagnosed in 2020 and was compliant with treatment at the time in late 2020 when she was last seen in sleep clinic here at [facility].  She reports doing well with her AutoPap machine.  She uses a fullface mask and is generally compliant with treatment.  She needs new supplies.  Bedtime is generally between 10 and 11 and rise time between 6 and 7.  She works full-time at [employer].  She has some trouble going to sleep at times, she has tried melatonin and also another over-the-counter supplements in the past.  Her Epworth sleepiness score is 7 out of 24, fatigue severity score is 57 out of 63.  I reviewed your office note from 10/25/2022. Her baseline sleep study from 08/24/2019 had shown a total AHI of 12.4/hour, REM AHI of 50.6/hour, and O2 nadir of 82%. I reviewed her AutoPap compliance data from 03/04/2023 through 04/02/2023, which is a total of 30 days, during which time she used her machine 28 days with percent use days greater than 4 hours at 90%, indicating excellent compliance, average usage of 6 hours and 32 minutes, residual AHI at goal at 0.1/h, leak on the low side with the 95th percentile at 1.7 L/min on a pressure of 8.3 cm for the 95th percentile.  Pressure range of  6 to 12 cm with EPR of 3. Previously:  She saw Savannah Dapper, NP on 11/12/19, at which time she was compliant with her autoPAP of 6-12 cm and reported  Improvement of her sleep quality and daytime symptoms.    07/01/19: 42 year old right-handed woman with an underlying medical history of migraines, history of DVT, depression, anxiety, obesity, fibromyalgia and anemia, who reports problems with her memory for the past 1-1/2 years.  She has noted more difficulty in the past 6 months or so.  She has noticed originally in late 2018 or early 2019 difficulty with focusing.  She had other symptoms at the time and was diagnosed with vitamin D deficiency, she had a 20 pound weight gain since she moved back to [place] in July 2018.  She has had thyroid issues but had it checked several times.  She had multiple blood tests over the past year and year and a half.  She has difficulty with short-term memory, has word finding difficulty, difficulty concentrating, difficulty with name recall and sequencing tasks.  She has noticed increase in stress and mood lability.  She is in fact quite tearful during the entire office visit and anxious.  She has in the past seen a psychologist and therapist but not recently and not since she moved back to [place].  She is currently not on any antidepressant or anxiety medication.  She has been taking low-dose trazodone at night for sleep and  had been on generic Adderall until recently and is now on Provigil 100 mg daily.  She reports difficulty with sleep at night and daytime somnolence.  Her home sleep test showed really borderline sleep apnea and she was offered to consider AutoPap therapy but it was not necessary and she decided against it.  She reports no family history of dementia, mother died 4 years ago at a younger age, 25 from kidney cancer.  Her father is 68 years old.  She reports that her 3 year old sister has had memory complaints and has a mood disorder including bipolar  disease and suffers from insomnia as well. She has had recent blood work which I reviewed, on 05/08/2019 she had CBC with differential, CMP, A1c, lipid panel, TSH.  All of these were unremarkable.  In March 2020 she had BMP and magnesium, in February she had ESR, CRP, thyroid peroxidase antibodies, ANA and CBC with differential, all unremarkable, with the exception of slightly elevated platelet count, total cholesterol of 228 and LDL of 151.  In March 2019 she had a normal B12 level and she also had RPR and HIV checked last year. I reviewed your recent office notes including video based visit notes.  11/25/2018: 42 year old right-handed woman with an underlying medical history of migraines, DVT, depression, anxiety, anemia, and obesity, who reports worsening daytime tiredness and sleepiness over the past couple of years, as well as intermittent snoring.she does not wake up rested. She has had trouble maintaining sleep but typically no difficulty falling asleep. I reviewed your office note from 10/24/2018. Her Epworth sleepiness score is 11 out of 24 today, fatigue score is 46 out of 63. She is single/divorced and lives with a roommate, she works at the [employer]. She has no children. She is a nonsmoker and drinks alcohol occasionally, about 2 times a week, caffeine in the form of coffee and soda, up to 2-3 servings per day, but not necessarily daily. Her father has sleep apnea and has a CPAP machine. Her ex-husband has sleep apnea and she is familiar with the diagnosis. She would be willing to consider CPAP therapy if the need arises. She has been struggling with her weight. She has not been as active this past year. She moved from [place] about 15 months ago. She endorses increase in stress, lost her mother at age 64 about 2 years ago and also went through a divorce recently. She denies telltale symptoms versus leg syndrome. She is not aware of any leg twitching while asleep. She does tend to be  a restless sleeper at times. She has woken up with a headache, up to 2 times per week, denies night to night nocturia. Her bedtime is generally between 9:30 and 10:30 PM, rise time around 6. She does not watch TV in her bedroom, she has 3 cats in her bedroom. When she has difficulty maintaining sleep and frequent nighttime awakenings, she tends to have more physical exhaustion, foggy headedness and difficulty with concentration.   Her Past Medical History Is Significant For: Past Medical History:  Diagnosis Date   Abnormal Pap smear of cervix    age 67 or 31   ADHD    Anemia    Anxiety    Back pain    Chronic fatigue    Depression    DVT (deep venous thrombosis)    DVT of axillary vein, acute left    2013   Fibroma    Fibromyalgia 2019   GERD (gastroesophageal reflux disease)  Hyperlipidemia    IBS (irritable bowel syndrome)    Kidney stones    Migraines    Other fatigue    Pneumonia    Poor sleep    Prediabetes    Shortness of breath on exertion    Sleep apnea    BiPAP   Thyroid disease    Urinary incontinence    Vitamin D deficiency     Her Past Surgical History Is Significant For: Past Surgical History:  Procedure Laterality Date   CRYOTHERAPY     For abnormal pap smear, age 67 or 68   INTRAUTERINE DEVICE INSERTION     PARAGUARD    Her Family History Is Significant For: Family History  Problem Relation Age of Onset   Anxiety disorder Mother    Depression Mother    Kidney disease Mother    Hypertension Mother    Diabetes Mother    Kidney cancer Mother    Renal cancer Mother    Obesity Father    Sleep apnea Father    Anxiety disorder Father    Depression Father    Hyperlipidemia Father    Diabetes Father    Heart disease Father    Stroke Father    Colon polyps Father    Hypertension Father    Other Father        COVID CAUSE OF DEATH   Uterine cancer Sister    Colon polyps Sister    Endometriosis Sister    Hypertension Sister    Other Sister         Lung disease   Ovarian cancer Sister        hysterectomy   Other Brother        Lung disease   Diabetes Paternal Grandmother    Heart disease Paternal Grandmother        CHF   Diabetes Paternal Grandfather    Stroke Paternal Grandfather    Heart attack Paternal Grandfather    Kidney disease Maternal Uncle    Diabetes Paternal Aunt    Heart disease Other    Depression Other    Anxiety disorder Other    Sleep apnea Other    Obesity Other     Her Social History Is Significant For: Social History   Socioeconomic History   Marital status: Divorced    Spouse name: Not on file   Number of children: 0   Years of education: Not on file   Highest education level: Master's degree (e.g., MA, MS, MEng, MEd, MSW, MBA)  Occupational History   Occupation: Firefighter: [employer]  Tobacco Use   Smoking status: Never   Smokeless tobacco: Never  Vaping Use   Vaping Use: Never used  Substance and Sexual Activity   Alcohol use: Yes    Alcohol/week: 0.0 - 3.0 standard drinks of alcohol    Comment: occasional   Drug use: Never   Sexual activity: Not Currently    Partners: Male, Female    Birth control/protection: I.U.D.  Other Topics Concern   Not on file  Social History Narrative   Recently moved back to [place] after divorcing husband. Went to college here and still has friends here. Enjoys hiking. Trying to integrate herself back into the community.    Social Determinants of Health   Financial Resource Strain: Not on file  Food Insecurity: Not on file  Transportation Needs: Not on file  Physical Activity: Not on file  Stress: Not on file  Social  Connections: Not on file    Her Allergies Are:  Allergies  Allergen Reactions   Aspirin Hives   Codeine Other (See Comments)    migraine   Latex Hives  :   Her Current Medications Are:  Outpatient Encounter Medications as of 04/04/2023  Medication Sig   Acetaminophen (PAIN RELIEF PO) Take 1 tablet  by mouth daily.   famotidine (PEPCID) 40 MG tablet Take 1 tablet (40 mg total) by mouth 2 (two) times daily. Good Rx   levonorgestrel (MIRENA) 20 MCG/24HR IUD 1 each by Intrauterine route once.   Multiple Vitamins-Minerals (MULTIVITAMIN PO) Take 1 tablet by mouth daily.    naproxen sodium (ALEVE) 220 MG tablet Take 220 mg by mouth daily as needed.   NON FORMULARY Otc sleep tonight   NON FORMULARY Fibromyalgia Support otc   ondansetron (ZOFRAN-ODT) 8 MG disintegrating tablet TAKE 1 TABLET BY MOUTH EVERY 8 HOURS AS NEEDED FOR NAUSEA OR VOMITING.   pantoprazole (PROTONIX) 40 MG tablet Take 1 tablet (40 mg total) by mouth 2 (two) times daily before a meal. **PLEASE CONTACT OFFICE TO SCHEDULE FOLLOW UP   Probiotic Product (PROBIOTIC PEARLS WOMENS PO) Take 1 each by mouth daily.   tiZANidine (ZANAFLEX) 4 MG tablet TAKE 1 TABLET BY MOUTH EVERY 6 HOURS AS NEEDED FOR MUSCLE SPASMS.   Semaglutide-Weight Management (WEGOVY) 0.25 MG/0.5ML SOAJ Inject 0.25 mg into the skin once a week. For 4 weeks   No facility-administered encounter medications on file as of 04/04/2023.  :   Review of Systems:  Out of a complete 14 point review of systems, all are reviewed and negative with the exception of these symptoms as listed below:   Review of Systems  Neurological:        Pt here for sleep consult  Pt fatigue,some headaches. Pt has Bipap and wears 4 + hours nightly  Pt sleep study 2021    ESS:7 FSS:57    Objective:  Neurological Exam  Physical Exam Physical Examination:   Vitals:   04/04/23 1348  BP: (!) 135/95  Pulse: 74    General Examination: The patient is a very pleasant 42 y.o. female in no acute distress. She appears well-developed and well-nourished and well groomed.   HEENT: Normocephalic, atraumatic, pupils are equal, round and reactive to light and accommodation. Corrective eye glasses in place. Extraocular tracking is good without limitation to gaze excursion or nystagmus noted.  Normal smooth pursuit is noted. Hearing is grossly intact. Face is symmetric with normal facial animation and normal facial sensation. Speech is clear with no dysarthria noted. There is no hypophonia. There is no lip, neck/head, jaw or voice tremor. Neck is supple with full range of passive and active motion. There are no carotid bruits on auscultation. Oropharynx exam reveals: mild to moderate mouth dryness, adequate dental hygiene and mild airway crowding. Tongue protrudes centrally and palate elevates symmetrically.    Chest: Clear to auscultation without wheezing, rhonchi or crackles noted.   Heart: S1+S2+0, regular and normal without murmurs, rubs or gallops noted.    Abdomen: Soft, non-tender and non-distended.   Extremities: There is no pitting edema in the distal lower extremities bilaterally.    Skin: Warm and dry without trophic changes noted.   Musculoskeletal: exam reveals no obvious joint deformities.    Neurologically:  Mental status: The patient is awake, alert and oriented in all 4 spheres. Her immediate and remote memory, attention, language skills and fund of knowledge are appropriate. There is no evidence of  aphasia, agnosia, apraxia or anomia. Speech is clear with normal prosody and enunciation. Thought process is linear. Mood is normal, affect normal.      Cranial nerves II - XII are as described above under HEENT exam.  Motor exam: Normal bulk, strength and tone is noted. There is no resting tremor, slight action tremor in both upper extremities.  Fine motor skills and coordination: grossly intact, cerebellar testing: No dysmetria or intention tremor. No truncal or gait ataxia.  Sensory exam: intact to light touch.  Gait, station and balance: She stands easily. No veering to one side is noted. No leaning to one side is noted. Posture is age-appropriate and stance is narrow based. Gait shows normal stride length and normal pace. No problems turning are noted.      Assessment and plan:  In summary, Savannah George is a 42 year old female with an underlying medical history of irritable bowel syndrome, reflux disease, prediabetes, thyroid disease, history of kidney stones, vitamin D deficiency, history of DVT, fibromyalgia, anemia, anxiety, depression, back pain, and severe obesity with a BMI of over 40, who presents for reevaluation of her sleep apnea.  She has been on AutoPap therapy with good compliance.  She has not been seen in this clinic in a few years but has benefited from treatment with her AutoPap machine, she needs new supplies.  She is commended for treatment adherence.  She is advised that I will send an order of CPAP supplies to her current DME provider.  She is advised to continue to work on weight loss.  She had good success with Ozempic but unfortunately her insurance stopped coverage for it.   She has had some weight gain after stopping the Ozempic.  She is advised to follow-up in sleep clinic to see one of our nurse practitioners routinely in 1 year.  I answered all her questions today and she was in agreement.   Thank you very much for allowing me to participate in the care of this nice patient. If I can be of any further assistance to you please do not hesitate to call me at [phone].   Sincerely,     Savannah FoleySaima Abreanna Drawdy, MD, PhD

## 2023-04-04 NOTE — Patient Instructions (Signed)
It was nice to see you again today. I am glad to hear, things are going well with your autoPAP therapy. Please be sure to change your filter every month, your mask about every 3 months, hose about every 6 months, humidifier chamber about yearly. Some restrictions are imposed by your insurance carrier with regard to how frequently you can get certain supplies.  Your DME company can provide further details if necessary.   Please continue using your autoPAP regularly. While your insurance requires that you use PAP at least 4 hours each night on 70% of the nights, I recommend, that you not skip any nights and use it throughout the night if you can. Getting used to PAP and staying with the treatment long term does take time and patience and discipline. Untreated obstructive sleep apnea when it is moderate to severe can have an adverse impact on cardiovascular health and raise her risk for heart disease, arrhythmias, hypertension, congestive heart failure, stroke and diabetes. Untreated obstructive sleep apnea causes sleep disruption, nonrestorative sleep, and sleep deprivation. This can have an impact on your day to day functioning and cause daytime sleepiness and impairment of cognitive function, memory loss, mood disturbance, and problems focussing. Using PAP regularly can improve these symptoms.  We can see you in 1 year, you can see one of our nurse practitioners as you are stable.

## 2023-04-09 NOTE — Progress Notes (Signed)
New, Doristine Mango, RN; Penni Homans; Marveen Reeks; Santina Evans Received, Thank you!       Previous Messages    ----- Message ----- From: Guy Begin, RN Sent: 04/08/2023   1:25 PM EDT To: Henderson Newcomer; Kathyrn Sheriff; Santina Evans; * Subject: renew cpap supplies                            New order in [facility] for pt .  Savannah George Female, 42 y.o., 10/10/1981 MRN: [id number]  Thanks you,  Andrey Campanile RN

## 2023-05-06 ENCOUNTER — Other Ambulatory Visit: Payer: Self-pay | Admitting: Family Medicine

## 2023-05-29 ENCOUNTER — Ambulatory Visit: Payer: 59 | Admitting: Obstetrics and Gynecology

## 2023-08-20 ENCOUNTER — Telehealth: Payer: Self-pay | Admitting: Family Medicine

## 2023-08-20 ENCOUNTER — Encounter: Payer: Self-pay | Admitting: Family Medicine

## 2023-08-20 NOTE — Telephone Encounter (Signed)
Sent mychart msg and letter in mail informing pt of appt change - NP out

## 2023-11-28 ENCOUNTER — Ambulatory Visit: Payer: 59 | Admitting: Radiology

## 2023-12-26 ENCOUNTER — Ambulatory Visit: Payer: Self-pay | Admitting: Obstetrics and Gynecology

## 2024-04-09 ENCOUNTER — Ambulatory Visit: Payer: 59 | Admitting: Family Medicine

## 2024-04-09 ENCOUNTER — Ambulatory Visit: Payer: 59 | Admitting: Neurology

## 2024-04-15 ENCOUNTER — Ambulatory Visit: Payer: 59 | Admitting: Family Medicine

## 2024-06-18 ENCOUNTER — Other Ambulatory Visit: Payer: Self-pay | Admitting: Family Medicine
